# Patient Record
Sex: Female | Born: 1960 | Race: White | Hispanic: No | Smoking: Former smoker
Health system: Southern US, Community
[De-identification: ages and names within clinical notes are randomized; demographics above are authoritative.]

## PROBLEM LIST (undated history)

## (undated) DIAGNOSIS — I10 Essential (primary) hypertension: Secondary | ICD-10-CM

## (undated) DIAGNOSIS — A0472 Enterocolitis due to Clostridium difficile, not specified as recurrent: Secondary | ICD-10-CM

## (undated) DIAGNOSIS — I2510 Atherosclerotic heart disease of native coronary artery without angina pectoris: Secondary | ICD-10-CM

## (undated) DIAGNOSIS — J439 Emphysema, unspecified: Secondary | ICD-10-CM

## (undated) DIAGNOSIS — R42 Dizziness and giddiness: Secondary | ICD-10-CM

## (undated) DIAGNOSIS — E785 Hyperlipidemia, unspecified: Secondary | ICD-10-CM

## (undated) HISTORY — DX: Dizziness and giddiness: R42

## (undated) HISTORY — PX: CHOLECYSTECTOMY: SHX55

## (undated) HISTORY — DX: Emphysema, unspecified: J43.9

## (undated) HISTORY — PX: HERNIA REPAIR: SHX51

## (undated) HISTORY — PX: ABDOMINAL HYSTERECTOMY: SHX81

## (undated) HISTORY — DX: Hyperlipidemia, unspecified: E78.5

## (undated) HISTORY — DX: Essential (primary) hypertension: I10

---

## 1995-05-20 HISTORY — PX: TUBAL LIGATION: SHX77

## 1997-05-19 HISTORY — PX: OTHER SURGICAL HISTORY: SHX169

## 2013-08-31 ENCOUNTER — Institutional Professional Consult (permissible substitution): Payer: Self-pay | Admitting: Emergency Medicine

## 2013-10-04 ENCOUNTER — Ambulatory Visit (INDEPENDENT_AMBULATORY_CARE_PROVIDER_SITE_OTHER): Payer: Self-pay | Admitting: Critical Care Medicine

## 2013-10-04 ENCOUNTER — Encounter: Payer: Self-pay | Admitting: Critical Care Medicine

## 2013-10-04 VITALS — BP 100/58 | HR 59 | Temp 99.4°F | Ht 61.0 in | Wt 147.2 lb

## 2013-10-04 DIAGNOSIS — I110 Hypertensive heart disease with heart failure: Secondary | ICD-10-CM | POA: Insufficient documentation

## 2013-10-04 DIAGNOSIS — J441 Chronic obstructive pulmonary disease with (acute) exacerbation: Secondary | ICD-10-CM | POA: Insufficient documentation

## 2013-10-04 DIAGNOSIS — E785 Hyperlipidemia, unspecified: Secondary | ICD-10-CM | POA: Insufficient documentation

## 2013-10-04 DIAGNOSIS — F17200 Nicotine dependence, unspecified, uncomplicated: Secondary | ICD-10-CM

## 2013-10-04 DIAGNOSIS — J438 Other emphysema: Secondary | ICD-10-CM

## 2013-10-04 DIAGNOSIS — R0600 Dyspnea, unspecified: Secondary | ICD-10-CM

## 2013-10-04 DIAGNOSIS — I10 Essential (primary) hypertension: Secondary | ICD-10-CM

## 2013-10-04 DIAGNOSIS — J439 Emphysema, unspecified: Secondary | ICD-10-CM | POA: Insufficient documentation

## 2013-10-04 MED ORDER — BUDESONIDE-FORMOTEROL FUMARATE 160-4.5 MCG/ACT IN AERO: 2.0000 | INHALATION_SPRAY | Freq: Two times a day (BID) | RESPIRATORY_TRACT | Status: AC

## 2013-10-04 MED ORDER — PREDNISONE 10 MG PO TABS: ORAL_TABLET | ORAL | Status: DC

## 2013-10-04 MED ORDER — AZITHROMYCIN 250 MG PO TABS: ORAL_TABLET | ORAL | Status: DC

## 2013-10-04 NOTE — Assessment & Plan Note (Signed)
Ongoing tobacco use Plan Smoking cessation >10 min counseling issued to the pt

## 2013-10-04 NOTE — Progress Notes (Signed)
Subjective:    Patient ID: Kathy Lawson, female    DOB: 12-Oct-1960, 53 y.o.   MRN: [id number]  HPI Comments: Pt with dyspnea and cough .  Freq bronchitis.  ?COPD> never dx.  Has been to ED and thought had some emphysema. Now smokes 1.5 PPD.    Cough This is a chronic problem. The current episode started more than 1 year ago. The problem has been waxing and waning (chronic cough ). The problem occurs every few hours. The cough is productive of sputum (clear thick mucus). Associated symptoms include heartburn, nasal congestion, postnasal drip, shortness of breath and wheezing. Pertinent negatives include no chest pain, chills, ear congestion, ear pain, fever, headaches, hemoptysis, rash, rhinorrhea, sore throat, sweats or weight loss. Associated symptoms comments: Dyspnea with walking, working outdoors and indoors. Notes some qhs dyspnea. The symptoms are aggravated by exercise, cold air, fumes, dust, pollens, stress and lying down. Risk factors for lung disease include smoking/tobacco exposure. She has tried a beta-agonist inhaler for the symptoms. The treatment provided moderate relief. Her past medical history is significant for bronchitis and pneumonia. There is no history of asthma, bronchiectasis, COPD, emphysema or environmental allergies.   Past Medical History  Diagnosis Date  . Hypertension   . Hyperlipemia   . Emphysema/COPD   . Vertigo      Family History  Problem Relation Age of Onset  . Emphysema Father   . Allergies Sister   . Rheum arthritis Mother   . Skin cancer Mother      History   Social History  . Marital Status: N/A    Spouse Name: N/A    Number of Children: N/A  . Years of Education: N/A   Occupational History  . Not on file.   Social History Main Topics  . Smoking status: Current Every Day Smoker -- 1.50 packs/day    Types: Cigarettes    Start date: 05/19/1974  . Smokeless tobacco: Never Used  . Alcohol Use: Yes     Comment: 5-6 beers couple  times weekly  . Drug Use: No  . Sexual Activity: Not on file   Other Topics Concern  . Not on file   Social History Narrative  . No narrative on file     No Known Allergies   No outpatient prescriptions prior to visit.   No facility-administered medications prior to visit.       Review of Systems  Constitutional: Negative for fever, chills and weight loss.  HENT: Positive for postnasal drip, sinus pressure, sneezing, tinnitus, trouble swallowing and voice change. Negative for ear pain, rhinorrhea and sore throat.   Respiratory: Positive for cough, shortness of breath and wheezing. Negative for hemoptysis.   Cardiovascular: Negative for chest pain.  Gastrointestinal: Positive for heartburn.       Gerd   Skin: Negative for rash.  Allergic/Immunologic: Negative for environmental allergies.  Neurological: Negative for headaches.       Objective:   Physical Exam Filed Vitals:   10/04/13 1102  BP: 100/58  Pulse: 59  Temp: 99.4 F (37.4 C)  TempSrc: Oral  Height: 5' 1"  (1.549 m)  Weight: 147 lb 3.2 oz (66.769 kg)  SpO2: 97%    Gen: Pleasant, well-nourished, in no distress,  normal affect  ENT: No lesions,  mouth clear,  oropharynx clear, no postnasal drip  Neck: No JVD, no TMG, no carotid bruits  Lungs: No use of accessory muscles, no dullness to percussion,expired wheezes  Cardiovascular: RRR, heart sounds normal,  no murmur or gallops, no peripheral edema  Abdomen: soft and NT, no HSM,  BS normal  Musculoskeletal: No deformities, no cyanosis or clubbing  Neuro: alert, non focal  Skin: Warm, no lesions or rashes  No results found.  Arlyce Harman: severe obstruction CXR: copd changes         Assessment & Plan:   Obstructive chronic bronchitis with exacerbation Asthmatic bronchitis with flare Gold C Copd , Ongoing tobacco use  Plan Stop smoking, use nicorette minis lozenges 46m 4-6 per day Prednisone 185mTake 4 tablets daily for 5 days then  stop Start Symbicort two puff twice daily Azithromycin 25057make two once then one daily until gone Use albuterol as needed A blood test to check for gene deficiency for premature emphysema will be obtained Return 2 months   Emphysema/COPD Gold C Primary emphysema Plan Check AA1 AT assay   Tobacco use disorder Ongoing tobacco use Plan Smoking cessation >10 min counseling issued to the pt    Updated Medication List Outpatient Encounter Prescriptions as of 10/04/2013  Medication Sig  . albuterol (PROVENTIL HFA;VENTOLIN HFA) 108 (90 BASE) MCG/ACT inhaler Inhale 2 puffs into the lungs. Every 2-3 hours as needed  . albuterol (PROVENTIL) (2.5 MG/3ML) 0.083% nebulizer solution Take 2.5 mg by nebulization every 6 (six) hours as needed for wheezing or shortness of breath.  . aMarland Kitchenpirin 81 MG tablet Take 81 mg by mouth daily.  . aMarland Kitchenorvastatin (LIPITOR) 10 MG tablet Take 10 mg by mouth daily.  . calcium-vitamin D (OSCAL) 250-125 MG-UNIT per tablet Take 1 tablet by mouth 2 (two) times daily.  . Cholecalciferol (VITAMIN D PO) Take by mouth daily.  . diphenoxylate-atropine (LOMOTIL) 2.5-0.025 MG per tablet Take 2 tablets by mouth 3 (three) times daily as needed for diarrhea or loose stools.  . eMarland Kitchencitalopram (LEXAPRO) 10 MG tablet Take 10 mg by mouth daily.  . fluticasone (FLONASE) 50 MCG/ACT nasal spray Place 2 sprays into both nostrils daily as needed for allergies or rhinitis.  . lMarland Kitchensinopril-hydrochlorothiazide (PRINZIDE,ZESTORETIC) 20-12.5 MG per tablet Take 1 tablet by mouth daily.  . metoprolol succinate (TOPROL-XL) 25 MG 24 hr tablet Take 25 mg by mouth daily.  . montelukast (SINGULAIR) 10 MG tablet Take 10 mg by mouth at bedtime.  . oMarland Kitcheneprazole (PRILOSEC) 40 MG capsule Take 40 mg by mouth daily.  . [DISCONTINUED] lisinopril (PRINIVIL,ZESTRIL) 20 MG tablet Take 20 mg by mouth daily.  . aMarland Kitchenithromycin (ZITHROMAX) 250 MG tablet Take two once then one daily until gone  . budesonide-formoterol  (SYMBICORT) 160-4.5 MCG/ACT inhaler Inhale 2 puffs into the lungs 2 (two) times daily.  . predniSONE (DELTASONE) 10 MG tablet Take 4 tablets daily for 5 days then stop

## 2013-10-04 NOTE — Assessment & Plan Note (Signed)
Asthmatic bronchitis with flare Gold C Copd , Ongoing tobacco use  Plan Stop smoking, use nicorette minis lozenges 35m 4-6 per day Prednisone 141mTake 4 tablets daily for 5 days then stop Start Symbicort two puff twice daily Azithromycin 25038make two once then one daily until gone Use albuterol as needed A blood test to check for gene deficiency for premature emphysema will be obtained Return 2 months

## 2013-10-04 NOTE — Assessment & Plan Note (Addendum)
Primary emphysema Plan Check AA1 AT assay

## 2013-10-04 NOTE — Patient Instructions (Addendum)
Stop smoking, use nicorette minis lozenges 2m 4-6 per day Prednisone 130mTake 4 tablets daily for 5 days then stop Start Symbicort two puff twice daily Azithromycin 25029make two once then one daily until gone Use albuterol as needed A blood test to check for gene deficiency for premature emphysema will be obtained Return 2 months

## 2013-10-11 ENCOUNTER — Encounter: Payer: Self-pay | Admitting: Critical Care Medicine

## 2013-10-11 ENCOUNTER — Telehealth: Payer: Self-pay | Admitting: Critical Care Medicine

## 2013-10-11 NOTE — Telephone Encounter (Signed)
Call the pt and tell her Alpha one antitrypsin level was NORMAL

## 2013-10-11 NOTE — Telephone Encounter (Signed)
Called, spoke with pt.  Informed her of Alpha one antitrypsin results per Dr. Joya Gaskins.  She verbalized understanding and voiced no further questions or concerns at this time.

## 2013-11-29 ENCOUNTER — Ambulatory Visit (INDEPENDENT_AMBULATORY_CARE_PROVIDER_SITE_OTHER): Payer: Self-pay | Admitting: Critical Care Medicine

## 2013-11-29 ENCOUNTER — Encounter: Payer: Self-pay | Admitting: Critical Care Medicine

## 2013-11-29 VITALS — BP 122/68 | HR 65 | Temp 98.0°F | Ht 62.0 in | Wt 148.0 lb

## 2013-11-29 DIAGNOSIS — F17200 Nicotine dependence, unspecified, uncomplicated: Secondary | ICD-10-CM

## 2013-11-29 DIAGNOSIS — J438 Other emphysema: Secondary | ICD-10-CM

## 2013-11-29 DIAGNOSIS — J432 Centrilobular emphysema: Secondary | ICD-10-CM

## 2013-11-29 DIAGNOSIS — J441 Chronic obstructive pulmonary disease with (acute) exacerbation: Secondary | ICD-10-CM

## 2013-11-29 NOTE — Progress Notes (Signed)
Subjective:    Patient ID: Kathy Lawson, female    DOB: 16-Dec-1960, 53 y.o.   MRN: [id number]  HPI Comments: Pt with dyspnea and cough .  Freq bronchitis.  ?COPD> never dx.  Has been to ED and thought had some emphysema. Now smokes 1.5 PPD.    11/29/2013 Chief Complaint  Patient presents with  . 2 month follow up    DOE and cough have improved with symbicort.  Cough is prod mainly int the mornings with small amount of clear mucus.  No chest tightness/pain or wheezing.   Now on symbicort and doing well. Less dyspnea. Still smoking 1.5 PPD.   Pt went on another round on prednisone since last ov.  Has not gotten nicotine replacement.       Review of Systems  HENT: Positive for sinus pressure, sneezing, tinnitus, trouble swallowing and voice change.   Gastrointestinal:       Gerd        Objective:   Physical Exam  Filed Vitals:   11/29/13 1415  BP: 122/68  Pulse: 65  Temp: 98 F (36.7 C)  TempSrc: Oral  Height: 5' 2"  (1.575 m)  Weight: 148 lb (67.132 kg)  SpO2: 99%    Gen: Pleasant, well-nourished, in no distress,  normal affect  ENT: No lesions,  mouth clear,  oropharynx clear, no postnasal drip  Neck: No JVD, no TMG, no carotid bruits  Lungs: No use of accessory muscles, no dullness to percussion,decreased  wheezes  Cardiovascular: RRR, heart sounds normal, no murmur or gallops, no peripheral edema  Abdomen: soft and NT, no HSM,  BS normal  Musculoskeletal: No deformities, no cyanosis or clubbing  Neuro: alert, non focal  Skin: Warm, no lesions or rashes  No results found.     Assessment & Plan:   Emphysema/COPD Gold C Copd gold C  Ongoing tobacco use  Plan Stay on symbicort twice daily Focus on smoking cessation with nicorette minis 17m 6-8 per day Return 3 months    Updated Medication List Outpatient Encounter Prescriptions as of 11/29/2013  Medication Sig  . albuterol (PROVENTIL HFA;VENTOLIN HFA) 108 (90 BASE) MCG/ACT inhaler Inhale  2 puffs into the lungs. Every 2-3 hours as needed  . albuterol (PROVENTIL) (2.5 MG/3ML) 0.083% nebulizer solution Take 2.5 mg by nebulization every 6 (six) hours as needed for wheezing or shortness of breath.  .Marland Kitchenaspirin 81 MG tablet Take 81 mg by mouth daily.  .Marland Kitchenatorvastatin (LIPITOR) 10 MG tablet Take 10 mg by mouth daily.  . budesonide-formoterol (SYMBICORT) 160-4.5 MCG/ACT inhaler Inhale 2 puffs into the lungs 2 (two) times daily.  . calcium-vitamin D (OSCAL) 250-125 MG-UNIT per tablet Take 1 tablet by mouth 2 (two) times daily.  . Cholecalciferol (VITAMIN D PO) Take by mouth daily.  . diphenoxylate-atropine (LOMOTIL) 2.5-0.025 MG per tablet Take 2 tablets by mouth 3 (three) times daily as needed for diarrhea or loose stools.  .Marland Kitchenescitalopram (LEXAPRO) 10 MG tablet Take 10 mg by mouth daily.  . fluticasone (FLONASE) 50 MCG/ACT nasal spray Place 2 sprays into both nostrils daily as needed for allergies or rhinitis.  .Marland Kitchenlisinopril-hydrochlorothiazide (PRINZIDE,ZESTORETIC) 20-12.5 MG per tablet Take 1 tablet by mouth daily.  . metoprolol succinate (TOPROL-XL) 25 MG 24 hr tablet Take 25 mg by mouth daily.  . montelukast (SINGULAIR) 10 MG tablet Take 10 mg by mouth at bedtime.  .Marland Kitchenomeprazole (PRILOSEC) 40 MG capsule Take 40 mg by mouth daily.  . [DISCONTINUED] azithromycin (ZITHROMAX) 250 MG  tablet Take two once then one daily until gone  . [DISCONTINUED] predniSONE (DELTASONE) 10 MG tablet Take 4 tablets daily for 5 days then stop

## 2013-11-29 NOTE — Patient Instructions (Signed)
Stay on symbicort twice daily Focus on smoking cessation with nicorette minis 67m 6-8 per day Return 3 months

## 2013-12-01 NOTE — Assessment & Plan Note (Signed)
Copd gold C  Ongoing tobacco use  Plan Stay on symbicort twice daily Focus on smoking cessation with nicorette minis 42m 6-8 per day Return 3 months

## 2013-12-20 ENCOUNTER — Encounter: Payer: Self-pay | Admitting: Critical Care Medicine

## 2014-04-18 ENCOUNTER — Ambulatory Visit: Payer: Medicaid Other | Admitting: Critical Care Medicine

## 2015-02-07 DIAGNOSIS — Z8709 Personal history of other diseases of the respiratory system: Secondary | ICD-10-CM

## 2015-02-07 DIAGNOSIS — J309 Allergic rhinitis, unspecified: Principal | ICD-10-CM

## 2015-02-07 DIAGNOSIS — Z789 Other specified health status: Secondary | ICD-10-CM

## 2015-02-07 DIAGNOSIS — K219 Gastro-esophageal reflux disease without esophagitis: Secondary | ICD-10-CM | POA: Insufficient documentation

## 2015-02-07 DIAGNOSIS — Z720 Tobacco use: Secondary | ICD-10-CM | POA: Insufficient documentation

## 2015-02-07 DIAGNOSIS — R519 Headache, unspecified: Secondary | ICD-10-CM | POA: Insufficient documentation

## 2015-02-07 DIAGNOSIS — R51 Headache: Secondary | ICD-10-CM

## 2015-02-07 DIAGNOSIS — J329 Chronic sinusitis, unspecified: Secondary | ICD-10-CM | POA: Insufficient documentation

## 2015-02-07 DIAGNOSIS — H1010 Acute atopic conjunctivitis, unspecified eye: Secondary | ICD-10-CM | POA: Insufficient documentation

## 2015-11-18 DIAGNOSIS — I10 Essential (primary) hypertension: Secondary | ICD-10-CM | POA: Diagnosis not present

## 2015-11-18 DIAGNOSIS — E871 Hypo-osmolality and hyponatremia: Secondary | ICD-10-CM

## 2015-11-18 DIAGNOSIS — Z720 Tobacco use: Secondary | ICD-10-CM

## 2015-11-18 DIAGNOSIS — J449 Chronic obstructive pulmonary disease, unspecified: Secondary | ICD-10-CM | POA: Diagnosis not present

## 2015-11-18 DIAGNOSIS — E876 Hypokalemia: Secondary | ICD-10-CM | POA: Diagnosis not present

## 2015-11-18 DIAGNOSIS — N179 Acute kidney failure, unspecified: Secondary | ICD-10-CM | POA: Diagnosis not present

## 2015-11-18 DIAGNOSIS — S81802A Unspecified open wound, left lower leg, initial encounter: Secondary | ICD-10-CM

## 2015-11-18 DIAGNOSIS — N390 Urinary tract infection, site not specified: Secondary | ICD-10-CM

## 2015-11-18 DIAGNOSIS — G894 Chronic pain syndrome: Secondary | ICD-10-CM

## 2015-11-19 DIAGNOSIS — J449 Chronic obstructive pulmonary disease, unspecified: Secondary | ICD-10-CM | POA: Diagnosis not present

## 2015-11-19 DIAGNOSIS — N179 Acute kidney failure, unspecified: Secondary | ICD-10-CM | POA: Diagnosis not present

## 2015-11-19 DIAGNOSIS — E876 Hypokalemia: Secondary | ICD-10-CM | POA: Diagnosis not present

## 2015-11-19 DIAGNOSIS — I10 Essential (primary) hypertension: Secondary | ICD-10-CM | POA: Diagnosis not present

## 2015-11-20 DIAGNOSIS — I10 Essential (primary) hypertension: Secondary | ICD-10-CM | POA: Diagnosis not present

## 2015-11-20 DIAGNOSIS — J449 Chronic obstructive pulmonary disease, unspecified: Secondary | ICD-10-CM | POA: Diagnosis not present

## 2015-11-20 DIAGNOSIS — N179 Acute kidney failure, unspecified: Secondary | ICD-10-CM | POA: Diagnosis not present

## 2015-11-20 DIAGNOSIS — E876 Hypokalemia: Secondary | ICD-10-CM | POA: Diagnosis not present

## 2015-11-21 DIAGNOSIS — E871 Hypo-osmolality and hyponatremia: Secondary | ICD-10-CM

## 2015-11-21 DIAGNOSIS — E876 Hypokalemia: Secondary | ICD-10-CM | POA: Diagnosis not present

## 2015-11-21 DIAGNOSIS — J449 Chronic obstructive pulmonary disease, unspecified: Secondary | ICD-10-CM

## 2015-11-22 DIAGNOSIS — E876 Hypokalemia: Secondary | ICD-10-CM | POA: Diagnosis not present

## 2015-11-22 DIAGNOSIS — E871 Hypo-osmolality and hyponatremia: Secondary | ICD-10-CM | POA: Diagnosis not present

## 2015-11-22 DIAGNOSIS — J449 Chronic obstructive pulmonary disease, unspecified: Secondary | ICD-10-CM | POA: Diagnosis not present

## 2015-12-24 DIAGNOSIS — L97922 Non-pressure chronic ulcer of unspecified part of left lower leg with fat layer exposed: Secondary | ICD-10-CM | POA: Insufficient documentation

## 2018-07-01 DIAGNOSIS — I361 Nonrheumatic tricuspid (valve) insufficiency: Secondary | ICD-10-CM

## 2018-07-01 DIAGNOSIS — E876 Hypokalemia: Secondary | ICD-10-CM

## 2018-07-01 DIAGNOSIS — E871 Hypo-osmolality and hyponatremia: Secondary | ICD-10-CM

## 2018-07-01 DIAGNOSIS — J9621 Acute and chronic respiratory failure with hypoxia: Secondary | ICD-10-CM

## 2018-07-01 DIAGNOSIS — A419 Sepsis, unspecified organism: Secondary | ICD-10-CM

## 2018-07-01 DIAGNOSIS — J440 Chronic obstructive pulmonary disease with acute lower respiratory infection: Secondary | ICD-10-CM

## 2018-07-01 DIAGNOSIS — J189 Pneumonia, unspecified organism: Secondary | ICD-10-CM

## 2018-07-01 DIAGNOSIS — I472 Ventricular tachycardia: Secondary | ICD-10-CM

## 2018-07-01 DIAGNOSIS — I10 Essential (primary) hypertension: Secondary | ICD-10-CM

## 2018-07-02 DIAGNOSIS — R7989 Other specified abnormal findings of blood chemistry: Secondary | ICD-10-CM

## 2018-07-02 DIAGNOSIS — I429 Cardiomyopathy, unspecified: Secondary | ICD-10-CM

## 2018-07-03 DIAGNOSIS — I10 Essential (primary) hypertension: Secondary | ICD-10-CM

## 2018-07-03 DIAGNOSIS — I429 Cardiomyopathy, unspecified: Secondary | ICD-10-CM

## 2018-07-03 DIAGNOSIS — R7989 Other specified abnormal findings of blood chemistry: Secondary | ICD-10-CM

## 2018-07-10 DIAGNOSIS — J449 Chronic obstructive pulmonary disease, unspecified: Secondary | ICD-10-CM

## 2018-07-10 DIAGNOSIS — R7989 Other specified abnormal findings of blood chemistry: Secondary | ICD-10-CM

## 2018-07-10 DIAGNOSIS — Z720 Tobacco use: Secondary | ICD-10-CM

## 2018-07-28 NOTE — Progress Notes (Signed)
Cardiology Office Note:    Date:  07/29/2018   ID:  Kathy Lawson, DOB 1961-03-08, MRN [id number]  PCP:  Damita Dunnings, NP (Inactive)  Cardiologist:  Shirlee More, MD   Referring MD: No ref. provider found  ASSESSMENT:    1. CAD in native artery   2. Chronic combined systolic and diastolic CHF (congestive heart failure) ([place])   3. Shortness of breath   4. Leg swelling   5. Essential hypertension   6. Tobacco abuse   7. Obstructive chronic bronchitis with exacerbation (H[facility])    PLAN:    In order of problems listed above:  More than 45 minutes and greater than 50% was spent in education the patient and family in her medical conditions and cardiac problems as she was acutely ill in the hospital and several family members were not aware in care coordination especially with her decompensated COPD and heart failure today and the necessity for lab work in 1 week.  Is a very complicated visit involving 3 family members present part  1. Is a very chronically ill woman with a recent type II myocardial infarction in the setting of severe respiratory failure and pneumonia deterioration and COPD.  She is not doing well she has recurrent pneumonia and I think she is a very poor candidate for elective cardiac interventional procedures will maximize medical therapy and reassess in the office in 6 weeks and told the family at this time I would not refer for coronary angiography they agree 2. Heart failure is decompensated she is edematous I started on loop diuretic and asked her PCP to check renal function proBNP in 1 week's she tells me she had hyponatremia with furosemide in the past 3. Stable hypertension continue current treatment 4. Predominant problem is severe COPD with another acute exacerbation and pneumonia 5. Hyperlipidemia stable continue statin  Next appointment  6 weeks   Medication Adjustments/Labs and Tests Ordered: Current medicines are reviewed at length with the patient  today.  Concerns regarding medicines are outlined above.  Orders Placed This Encounter  Procedures  . Basic Metabolic Panel (BMET)  . Pro b natriuretic peptide (BNP)  . EKG 12-Lead   Meds ordered this encounter  Medications  . DISCONTD: torsemide (DEMADEX) 20 MG tablet    Sig: Take 1 tablet (20 mg total) by mouth daily.    Dispense:  30 tablet    Refill:  1  . diltiazem (CARDIZEM CD) 180 MG 24 hr capsule    Sig: Take 1 capsule (180 mg total) by mouth daily.    Dispense:  30 capsule    Refill:  1     Chief Complaint  Patient presents with  . Follow-up    after Type 2 MI    History of Present Illness:    Kathy Lawson is a 58 y.o. female who is being seen today for the evaluation of CAD with recent non-ST elevation type II myocardial infarction in the setting of severe COPD profound respiratory distress and decompensated heart failure with IV fluid loading while admitted to [facility].  Her ejection fraction was in the range of 35 to 40% was discharged and sent to rehab.  Surprisingly she is not taking a diuretic and she is noticed that she has edema she initially improved and was walking independently 50 foot but she is back in a wheelchair now tells me she has recurrent pneumonia is short of breath at rest using bronchodilators and is coughing up clear sputum.  On  physical examination she has marked edema presacral 2-3+1+ lower extremities and has decompensated heart failure.  There is a note from the hospital that she is to see me to arrange coronary angiography and I think she is in poor condition for elective cardiac interventional procedures want him undergo start antianginal therapy with Cardizem with her severe COPD continue aspirin and statin and started back on loop diuretic she tells me she is intolerant of furosemide put her on Demadex 20 mg daily and asked the healthcare facility to do a BMP proBNP in 1 week.  The family agrees with the approach I will see her back in  the office in 4 to 6 weeks and will make a decision whether she is best served by noninvasive perfusion imaging or cardiac CTA or refer for coronary angiography.  She is not having orthopnea chest pain palpitation or syncope at the request of Dr Jannette Fogo  Past Medical History:  Diagnosis Date  . Emphysema/COPD ([place])   . Hyperlipemia   . Hypertension   . Vertigo     Past Surgical History:  Procedure Laterality Date  . CHOLECYSTECTOMY  1980s  . hysterectomy  1999  . TUBAL LIGATION  1997    Current Medications: Current Meds  Medication Sig  . albuterol (PROVENTIL HFA;VENTOLIN HFA) 108 (90 BASE) MCG/ACT inhaler Inhale 2 puffs into the lungs. Every 2-3 hours as needed  . albuterol (PROVENTIL) (2.5 MG/3ML) 0.083% nebulizer solution Take 2.5 mg by nebulization every 6 (six) hours as needed for wheezing or shortness of breath.  Marland Kitchen aspirin 81 MG tablet Take 81 mg by mouth daily.  . budesonide-formoterol (SYMBICORT) 160-4.5 MCG/ACT inhaler Inhale 2 puffs into the lungs 2 (two) times daily.  . Calcium Carbonate-Vit D-Min (CALCIUM 1200 PO) Take by mouth daily.  . calcium-vitamin D (OSCAL) 250-125 MG-UNIT per tablet Take 1 tablet by mouth 2 (two) times daily.  . cetirizine (ZYRTEC) 10 MG tablet Take 10 mg by mouth daily.  . Cholecalciferol (VITAMIN D PO) Take by mouth daily.  . diphenoxylate-atropine (LOMOTIL) 2.5-0.025 MG per tablet Take 2 tablets by mouth 3 (three) times daily as needed for diarrhea or loose stools.  . fluticasone (FLONASE) 50 MCG/ACT nasal spray Place 2 sprays into both nostrils daily as needed for allergies or rhinitis.     Allergies:   Lasix [furosemide]; Meloxicam; Other; and Latex   Social History   Socioeconomic History  . Marital status: Widowed    Spouse name: Not on file  . Number of children: Not on file  . Years of education: Not on file  . Highest education level: Not on file  Occupational History  . Not on file  Social Needs  . Financial resource strain:  Not on file  . Food insecurity:    Worry: Not on file    Inability: Not on file  . Transportation needs:    Medical: Not on file    Non-medical: Not on file  Tobacco Use  . Smoking status: Current Every Day Smoker    Packs/day: 1.50    Types: Cigarettes    Start date: 05/19/1974  . Smokeless tobacco: Never Used  Substance and Sexual Activity  . Alcohol use: Yes    Comment: 5-6 beers couple times weekly  . Drug use: No  . Sexual activity: Not on file  Lifestyle  . Physical activity:    Days per week: Not on file    Minutes per session: Not on file  . Stress: Not on file  Relationships  . Social connections:    Talks on phone: Not on file    Gets together: Not on file    Attends religious service: Not on file    Active member of club or organization: Not on file    Attends meetings of clubs or organizations: Not on file    Relationship status: Not on file  Other Topics Concern  . Not on file  Social History Narrative  . Not on file     Family History: The patient's family history includes Allergies in her sister; Emphysema in her father; Rheum arthritis in her mother; Skin cancer in her mother.  ROS:   ROS Please see the history of present illness.     All other systems reviewed and are negative.  EKGs/Labs/Other Studies Reviewed:    The following studies were reviewed today:   EKG:  EKG is  ordered today.  The ekg ordered today is personally reviewed and demonstrates marked sinus tachycardia and T wave inversions  Recent Labs: No results found for requested labs within last 8760 hours.  Recent Lipid Panel No results found for: CHOL, TRIG, HDL, CHOLHDL, VLDL, LDLCALC, LDLDIRECT  Physical Exam:    VS:  BP 118/62   Pulse (!) 113   Ht 5' 2"  (1.575 m)   Wt 173 lb (78.5 kg)   SpO2 98%   BMI 31.64 kg/m     Wt Readings from Last 3 Encounters:  07/29/18 173 lb (78.5 kg)  11/29/13 148 lb (67.1 kg)  10/04/13 147 lb 3.2 oz (66.8 kg)     GEN: She looks quite  debilitated and weak well nourished, well developed in no acute distress HEENT: Normal NECK: No JVD; No carotid bruits LYMPHATICS: No lymphadenopathy CARDIAC: Distant heart sounds RRR, no murmurs, rubs, gallops RESPIRATORY: Hyperinflated diffusely decreased breath sounds expiratory wheeze ABDOMEN: Soft, non-tender, non-distended MUSCULOSKELETAL: 3-4+ presacral edema edema; No deformity  SKIN: Warm and dry NEUROLOGIC:  Alert and oriented x 3 PSYCHIATRIC:  Normal affect     Signed, Shirlee More, MD  07/29/2018 1:05 PM    [facility]

## 2018-07-29 ENCOUNTER — Encounter: Payer: Self-pay | Admitting: Cardiology

## 2018-07-29 ENCOUNTER — Ambulatory Visit (INDEPENDENT_AMBULATORY_CARE_PROVIDER_SITE_OTHER): Payer: Medicaid Other | Admitting: Cardiology

## 2018-07-29 ENCOUNTER — Other Ambulatory Visit: Payer: Self-pay

## 2018-07-29 VITALS — BP 118/62 | HR 113 | Ht 62.0 in | Wt 173.0 lb

## 2018-07-29 DIAGNOSIS — R0602 Shortness of breath: Secondary | ICD-10-CM

## 2018-07-29 DIAGNOSIS — M7989 Other specified soft tissue disorders: Secondary | ICD-10-CM

## 2018-07-29 DIAGNOSIS — I2510 Atherosclerotic heart disease of native coronary artery without angina pectoris: Secondary | ICD-10-CM | POA: Insufficient documentation

## 2018-07-29 DIAGNOSIS — I5042 Chronic combined systolic (congestive) and diastolic (congestive) heart failure: Secondary | ICD-10-CM

## 2018-07-29 DIAGNOSIS — Z720 Tobacco use: Secondary | ICD-10-CM

## 2018-07-29 DIAGNOSIS — I10 Essential (primary) hypertension: Secondary | ICD-10-CM

## 2018-07-29 DIAGNOSIS — J441 Chronic obstructive pulmonary disease with (acute) exacerbation: Secondary | ICD-10-CM

## 2018-07-29 MED ORDER — TORSEMIDE 20 MG PO TABS: 20.0000 mg | ORAL_TABLET | Freq: Every day | ORAL | 1 refills | Status: DC

## 2018-07-29 MED ORDER — DILTIAZEM HCL ER COATED BEADS 180 MG PO CP24: 180.0000 mg | ORAL_CAPSULE | Freq: Every day | ORAL | 1 refills | Status: DC

## 2018-07-29 NOTE — Patient Instructions (Signed)
Medication Instructions:  Your physician has recommended you make the following change in your medication:  START torsemide (demadex) 20 mg: Take 1 tablet daily START diltiazem (cardizem CD) 180 mg: Take 1 tablet daily  If you need a refill on your cardiac medications before your next appointment, please call your pharmacy.   Lab work: Your physician recommends that you return for lab work in 1 week: BMP, ProBNP. Please return to our office for lab work, no appointment needed. No need to fast beforehand.   If you have labs (blood work) drawn today and your tests are completely normal, you will receive your results only by: Marland Kitchen MyChart Message (if you have MyChart) OR . A paper copy in the mail If you have any lab test that is abnormal or we need to change your treatment, we will call you to review the results.  Testing/Procedures: You had an EKG today.   Follow-Up: At [facility], you and your health needs are our priority.  As part of our continuing mission to provide you with exceptional heart care, we have created designated Provider Care Teams.  These Care Teams include your primary Cardiologist (physician) and Advanced Practice Providers (APPs -  Physician Assistants and Nurse Practitioners) who all work together to provide you with the care you need, when you need it. You will need a follow up appointment in 6 weeks.      Torsemide tablets What is this medicine? TORSEMIDE (TORE se mide) is a diuretic. It helps you make more urine and to lose salt and excess water from your body. This medicine is used to treat high blood pressure, and edema or swelling from heart, kidney, or liver disease. This medicine may be used for other purposes; ask your health care provider or pharmacist if you have questions. COMMON BRAND NAME(S): Demadex What should I tell my health care provider before I take this medicine? They need to know if you have any of these conditions: -abnormal blood  electrolytes -diabetes -gout -heart disease -kidney disease -liver disease -small amounts of urine, or difficulty passing urine -an unusual or allergic reaction to torsemide, sulfa drugs, other medicines, foods, dyes, or preservatives -pregnant or trying to get pregnant -breast-feeding How should I use this medicine? Take this medicine by mouth with a glass of water. Follow the directions on the prescription label. You may take this medicine with or without food. If it upsets your stomach, take it with food or milk. Do not take your medicine more often than directed. Remember that you will need to pass more urine after taking this medicine. Do not take your medicine at a time of day that will cause you problems. Do not take at bedtime. Talk to your pediatrician regarding the use of this medicine in children. Special care may be needed. Overdosage: If you think you have taken too much of this medicine contact a poison control center or emergency room at once. NOTE: This medicine is only for you. Do not share this medicine with others. What if I miss a dose? If you miss a dose, take it as soon as you can. If it is almost time for your next dose, take only that dose. Do not take double or extra doses. What may interact with this medicine? -alcohol -certain antibiotics given by injection certain heart medicines like digoxin -diuretics -lithium -medicines for diabetes -medicines for blood pressure -medicines for cholesterol like cholestyramine -medicines that relax muscles for surgery -NSAIDs, medicines for pain and inflammation, like  ibuprofen or naproxen -OTC supplements like ginseng and ephedra -probenecid -steroid medicines like prednisone or cortisone This list may not describe all possible interactions. Give your health care provider a list of all the medicines, herbs, non-prescription drugs, or dietary supplements you use. Also tell them if you smoke, drink alcohol, or use illegal  drugs. Some items may interact with your medicine. What should I watch for while using this medicine? Visit your doctor or health care professional for regular checks on your progress. Check your blood pressure regularly. Ask your doctor or health care professional what your blood pressure should be, and when you should contact him or her. If you are a diabetic, check your blood sugar as directed. You may need to be on a special diet while taking this medicine. Check with your doctor. Also, ask how many glasses of fluid you need to drink a day. You must not get dehydrated. You may get drowsy or dizzy. Do not drive, use machinery, or do anything that needs mental alertness until you know how this drug affects you. Do not stand or sit up quickly, especially if you are an older patient. This reduces the risk of dizzy or fainting spells. Alcohol can make you more drowsy and dizzy. Avoid alcoholic drinks. What side effects may I notice from receiving this medicine? Side effects that you should report to your doctor or health care professional as soon as possible: -allergic reactions such as skin rash or itching, hives, swelling of the lips, mouth, tongue or throat -blood in urine or stool -dry mouth -hearing loss or ringing in the ears -irregular heartbeat -muscle pain, weakness or cramps -pain or difficulty passing urine -unusually weak or tired -vomiting or diarrhea Side effects that usually do not require medical attention (report to your doctor or health care professional if they continue or are bothersome): -dizzy or lightheaded -headache -increased thirst -passing large amounts of urine -sexual difficulties -stomach pain, upset or nausea This list may not describe all possible side effects. Call your doctor for medical advice about side effects. You may report side effects to FDA at 1-800-FDA-1088. Where should I keep my medicine? Keep out of the reach of children. Store at room  temperature between 15 and 30 degrees C (59 and 86 degrees F). Throw away any unused medicine after the expiration date. NOTE: This sheet is a summary. It may not cover all possible information. If you have questions about this medicine, talk to your doctor, pharmacist, or health care provider.  2019 Elsevier/Gold Standard (2008-01-20 11:35:45)     Diltiazem extended-release capsules or tablets What is this medicine? DILTIAZEM (dil TYE a zem) is a calcium-channel blocker. It affects the amount of calcium found in your heart and muscle cells. This relaxes your blood vessels, which can reduce the amount of work the heart has to do. This medicine is used to treat high blood pressure and chest pain caused by angina. This medicine may be used for other purposes; ask your health care provider or pharmacist if you have questions. COMMON BRAND NAME(S): Cardizem CD, Cardizem LA, Cardizem SR, Cartia XT, Dilacor XR, Dilt-CD, Diltia XT, Diltzac, Matzim LA, Rema Fendt, Tiamate, Tiazac What should I tell my health care provider before I take this medicine? They need to know if you have any of these conditions: -heart problems, low blood pressure, irregular heartbeat -liver disease -previous heart attack -an unusual or allergic reaction to diltiazem, other medicines, foods, dyes, or preservatives -pregnant or trying to get pregnant -breast-feeding  How should I use this medicine? Take this medicine by mouth with a glass of water. Follow the directions on the prescription label. Swallow whole, do not crush or chew. Ask your doctor or pharmacist if your should take this medicine with food. Take your doses at regular intervals. Do not take your medicine more often then directed. Do not stop taking except on the advice of your doctor or health care professional. Ask your doctor or health care professional how to gradually reduce the dose. Talk to your pediatrician regarding the use of this medicine in children.  Special care may be needed. Overdosage: If you think you have taken too much of this medicine contact a poison control center or emergency room at once. NOTE: This medicine is only for you. Do not share this medicine with others. What if I miss a dose? If you miss a dose, take it as soon as you can. If it is almost time for your next dose, take only that dose. Do not take double or extra doses. What may interact with this medicine? Do not take this medicine with any of the following medications: -cisapride -hawthorn -pimozide -ranolazine -red yeast rice This medicine may also interact with the following medications: -buspirone -carbamazepine -cimetidine -cyclosporine -digoxin -local anesthetics or general anesthetics -lovastatin -medicines for anxiety or difficulty sleeping like midazolam and triazolam -medicines for high blood pressure or heart problems -quinidine -rifampin, rifabutin, or rifapentine This list may not describe all possible interactions. Give your health care provider a list of all the medicines, herbs, non-prescription drugs, or dietary supplements you use. Also tell them if you smoke, drink alcohol, or use illegal drugs. Some items may interact with your medicine. What should I watch for while using this medicine? Check your blood pressure and pulse rate regularly. Ask your doctor or health care professional what your blood pressure and pulse rate should be and when you should contact him or her. You may feel dizzy or lightheaded. Do not drive, use machinery, or do anything that needs mental alertness until you know how this medicine affects you. To reduce the risk of dizzy or fainting spells, do not sit or stand up quickly, especially if you are an older patient. Alcohol can make you more dizzy or increase flushing and rapid heartbeats. Avoid alcoholic drinks. What side effects may I notice from receiving this medicine? Side effects that you should report to your  doctor or health care professional as soon as possible: -allergic reactions like skin rash, itching or hives, swelling of the face, lips, or tongue -confusion, mental depression -feeling faint or lightheaded, falls -redness, blistering, peeling or loosening of the skin, including inside the mouth -slow, irregular heartbeat -swelling of the feet and ankles -unusual bleeding or bruising, pinpoint red spots on the skin Side effects that usually do not require medical attention (report to your doctor or health care professional if they continue or are bothersome): -constipation or diarrhea -difficulty sleeping -facial flushing -headache -nausea, vomiting -sexual dysfunction -weak or tired This list may not describe all possible side effects. Call your doctor for medical advice about side effects. You may report side effects to FDA at 1-800-FDA-1088. Where should I keep my medicine? Keep out of the reach of children. Store at room temperature between 15 and 30 degrees C (59 and 86 degrees F). Protect from humidity. Throw away any unused medicine after the expiration date. NOTE: This sheet is a summary. It may not cover all possible information. If you have questions  about this medicine, talk to your doctor, pharmacist, or health care provider.  2019 Elsevier/Gold Standard (2007-08-26 14:35:47)

## 2018-08-06 ENCOUNTER — Telehealth: Payer: Self-pay

## 2018-08-06 NOTE — Telephone Encounter (Signed)
Received call from Koleen Distance,  NP at [facility] regarding Kathy Lawson. Patient had been complaining of vertigo after recently starting on Torsemide 20 mg daily and Diltiazem 180 mg daily. Potassium checked at facility, result  2.7. Potassium replacement being administered today.  Patient wants to quit taking medications due to vertigo.   Above data reported to Dr. Bettina Gavia. Orders received to discontinue Torsemide and Diltiazem.   Whitney Clendenin informed that Torsemide and Diltiazem have been discontinued per [facility]. Verbalizes understanding of above.

## 2018-08-18 DIAGNOSIS — A0472 Enterocolitis due to Clostridium difficile, not specified as recurrent: Secondary | ICD-10-CM

## 2018-08-18 HISTORY — DX: Enterocolitis due to Clostridium difficile, not specified as recurrent: A04.72

## 2018-08-24 DIAGNOSIS — I429 Cardiomyopathy, unspecified: Secondary | ICD-10-CM

## 2018-08-24 DIAGNOSIS — E119 Type 2 diabetes mellitus without complications: Secondary | ICD-10-CM

## 2018-08-24 DIAGNOSIS — I959 Hypotension, unspecified: Secondary | ICD-10-CM | POA: Diagnosis not present

## 2018-08-24 DIAGNOSIS — E871 Hypo-osmolality and hyponatremia: Secondary | ICD-10-CM

## 2018-08-24 DIAGNOSIS — R531 Weakness: Secondary | ICD-10-CM

## 2018-08-24 DIAGNOSIS — J449 Chronic obstructive pulmonary disease, unspecified: Secondary | ICD-10-CM

## 2018-08-24 DIAGNOSIS — I5020 Unspecified systolic (congestive) heart failure: Secondary | ICD-10-CM

## 2018-08-24 DIAGNOSIS — R197 Diarrhea, unspecified: Secondary | ICD-10-CM

## 2018-08-25 DIAGNOSIS — I959 Hypotension, unspecified: Secondary | ICD-10-CM | POA: Diagnosis not present

## 2018-08-25 DIAGNOSIS — E871 Hypo-osmolality and hyponatremia: Secondary | ICD-10-CM | POA: Diagnosis not present

## 2018-08-25 DIAGNOSIS — R531 Weakness: Secondary | ICD-10-CM | POA: Diagnosis not present

## 2018-08-25 DIAGNOSIS — R197 Diarrhea, unspecified: Secondary | ICD-10-CM | POA: Diagnosis not present

## 2018-08-26 DIAGNOSIS — R531 Weakness: Secondary | ICD-10-CM | POA: Diagnosis not present

## 2018-08-26 DIAGNOSIS — E871 Hypo-osmolality and hyponatremia: Secondary | ICD-10-CM | POA: Diagnosis not present

## 2018-08-26 DIAGNOSIS — R197 Diarrhea, unspecified: Secondary | ICD-10-CM | POA: Diagnosis not present

## 2018-08-26 DIAGNOSIS — I959 Hypotension, unspecified: Secondary | ICD-10-CM | POA: Diagnosis not present

## 2018-08-27 DIAGNOSIS — R531 Weakness: Secondary | ICD-10-CM | POA: Diagnosis not present

## 2018-08-27 DIAGNOSIS — I959 Hypotension, unspecified: Secondary | ICD-10-CM | POA: Diagnosis not present

## 2018-08-27 DIAGNOSIS — E871 Hypo-osmolality and hyponatremia: Secondary | ICD-10-CM | POA: Diagnosis not present

## 2018-08-27 DIAGNOSIS — R197 Diarrhea, unspecified: Secondary | ICD-10-CM | POA: Diagnosis not present

## 2018-08-28 ENCOUNTER — Inpatient Hospital Stay (HOSPITAL_COMMUNITY)
Admission: AD | Admit: 2018-08-28 | Discharge: 2018-09-06 | DRG: 872 | Disposition: A | Discharge status: Skilled Nursing Facility | Payer: Medicaid Other | Source: Other Acute Inpatient Hospital | Attending: Internal Medicine | Admitting: Internal Medicine

## 2018-08-28 DIAGNOSIS — A0472 Enterocolitis due to Clostridium difficile, not specified as recurrent: Secondary | ICD-10-CM | POA: Diagnosis present

## 2018-08-28 DIAGNOSIS — I5022 Chronic systolic (congestive) heart failure: Secondary | ICD-10-CM | POA: Diagnosis present

## 2018-08-28 DIAGNOSIS — Z882 Allergy status to sulfonamides status: Secondary | ICD-10-CM

## 2018-08-28 DIAGNOSIS — Z888 Allergy status to other drugs, medicaments and biological substances status: Secondary | ICD-10-CM | POA: Diagnosis not present

## 2018-08-28 DIAGNOSIS — Z7982 Long term (current) use of aspirin: Secondary | ICD-10-CM

## 2018-08-28 DIAGNOSIS — K5010 Crohn's disease of large intestine without complications: Secondary | ICD-10-CM | POA: Diagnosis present

## 2018-08-28 DIAGNOSIS — R627 Adult failure to thrive: Secondary | ICD-10-CM | POA: Diagnosis present

## 2018-08-28 DIAGNOSIS — E871 Hypo-osmolality and hyponatremia: Secondary | ICD-10-CM | POA: Diagnosis present

## 2018-08-28 DIAGNOSIS — Z79899 Other long term (current) drug therapy: Secondary | ICD-10-CM

## 2018-08-28 DIAGNOSIS — R197 Diarrhea, unspecified: Secondary | ICD-10-CM | POA: Diagnosis present

## 2018-08-28 DIAGNOSIS — T380X5A Adverse effect of glucocorticoids and synthetic analogues, initial encounter: Secondary | ICD-10-CM | POA: Diagnosis not present

## 2018-08-28 DIAGNOSIS — E860 Dehydration: Secondary | ICD-10-CM | POA: Diagnosis present

## 2018-08-28 DIAGNOSIS — Z825 Family history of asthma and other chronic lower respiratory diseases: Secondary | ICD-10-CM

## 2018-08-28 DIAGNOSIS — Z7951 Long term (current) use of inhaled steroids: Secondary | ICD-10-CM

## 2018-08-28 DIAGNOSIS — E099 Drug or chemical induced diabetes mellitus without complications: Secondary | ICD-10-CM | POA: Diagnosis present

## 2018-08-28 DIAGNOSIS — Z8701 Personal history of pneumonia (recurrent): Secondary | ICD-10-CM | POA: Diagnosis not present

## 2018-08-28 DIAGNOSIS — Z87891 Personal history of nicotine dependence: Secondary | ICD-10-CM | POA: Diagnosis not present

## 2018-08-28 DIAGNOSIS — R531 Weakness: Secondary | ICD-10-CM | POA: Diagnosis not present

## 2018-08-28 DIAGNOSIS — Z91040 Latex allergy status: Secondary | ICD-10-CM

## 2018-08-28 DIAGNOSIS — Z886 Allergy status to analgesic agent status: Secondary | ICD-10-CM | POA: Diagnosis not present

## 2018-08-28 DIAGNOSIS — I110 Hypertensive heart disease with heart failure: Secondary | ICD-10-CM | POA: Diagnosis not present

## 2018-08-28 DIAGNOSIS — R109 Unspecified abdominal pain: Secondary | ICD-10-CM

## 2018-08-28 DIAGNOSIS — A414 Sepsis due to anaerobes: Principal | ICD-10-CM | POA: Diagnosis present

## 2018-08-28 DIAGNOSIS — I10 Essential (primary) hypertension: Secondary | ICD-10-CM | POA: Diagnosis not present

## 2018-08-28 DIAGNOSIS — E785 Hyperlipidemia, unspecified: Secondary | ICD-10-CM | POA: Diagnosis not present

## 2018-08-28 DIAGNOSIS — D649 Anemia, unspecified: Secondary | ICD-10-CM | POA: Diagnosis not present

## 2018-08-28 DIAGNOSIS — Z90710 Acquired absence of both cervix and uterus: Secondary | ICD-10-CM | POA: Diagnosis not present

## 2018-08-28 DIAGNOSIS — I2510 Atherosclerotic heart disease of native coronary artery without angina pectoris: Secondary | ICD-10-CM | POA: Diagnosis not present

## 2018-08-28 DIAGNOSIS — J432 Centrilobular emphysema: Secondary | ICD-10-CM | POA: Diagnosis not present

## 2018-08-28 DIAGNOSIS — R5381 Other malaise: Secondary | ICD-10-CM | POA: Diagnosis not present

## 2018-08-28 DIAGNOSIS — K5090 Crohn's disease, unspecified, without complications: Secondary | ICD-10-CM | POA: Diagnosis present

## 2018-08-28 DIAGNOSIS — K5931 Toxic megacolon: Secondary | ICD-10-CM

## 2018-08-28 DIAGNOSIS — I959 Hypotension, unspecified: Secondary | ICD-10-CM | POA: Diagnosis not present

## 2018-08-28 DIAGNOSIS — R601 Generalized edema: Secondary | ICD-10-CM | POA: Diagnosis not present

## 2018-08-28 HISTORY — DX: Enterocolitis due to Clostridium difficile, not specified as recurrent: A04.72

## 2018-08-28 LAB — COMPREHENSIVE METABOLIC PANEL WITH GFR
ALT: 14 U/L (ref 0–44)
AST: 9 U/L — ABNORMAL LOW (ref 15–41)
Albumin: 1.8 g/dL — ABNORMAL LOW (ref 3.5–5.0)
Alkaline Phosphatase: 86 U/L (ref 38–126)
Anion gap: 12 (ref 5–15)
BUN: 5 mg/dL — ABNORMAL LOW (ref 6–20)
CO2: 21 mmol/L — ABNORMAL LOW (ref 22–32)
Calcium: 7.8 mg/dL — ABNORMAL LOW (ref 8.9–10.3)
Chloride: 96 mmol/L — ABNORMAL LOW (ref 98–111)
Creatinine, Ser: 0.7 mg/dL (ref 0.44–1.00)
GFR calc Af Amer: >60 mL/min
GFR calc non Af Amer: >60 mL/min
Glucose, Bld: 110 mg/dL — ABNORMAL HIGH (ref 70–99)
Potassium: 3.4 mmol/L — ABNORMAL LOW (ref 3.5–5.1)
Sodium: 129 mmol/L — ABNORMAL LOW (ref 135–145)
Total Bilirubin: 0.6 mg/dL (ref 0.3–1.2)
Total Protein: 4.6 g/dL — ABNORMAL LOW (ref 6.5–8.1)

## 2018-08-28 LAB — CBC
HCT: 30.5 % — ABNORMAL LOW (ref 36.0–46.0)
Hemoglobin: 9.8 g/dL — ABNORMAL LOW (ref 12.0–15.0)
MCH: 32 pg (ref 26.0–34.0)
MCHC: 32.1 g/dL (ref 30.0–36.0)
MCV: 99.7 fL (ref 80.0–100.0)
Platelets: 332 K/uL (ref 150–400)
RBC: 3.06 MIL/uL — ABNORMAL LOW (ref 3.87–5.11)
RDW: 13.6 % (ref 11.5–15.5)
WBC: 16.7 K/uL — ABNORMAL HIGH (ref 4.0–10.5)
nRBC: 0 % (ref 0.0–0.2)

## 2018-08-28 LAB — PHOSPHORUS: Phosphorus: 3.7 mg/dL (ref 2.5–4.6)

## 2018-08-28 LAB — LACTIC ACID, PLASMA: Lactic Acid, Venous: 1.3 mmol/L (ref 0.5–1.9)

## 2018-08-28 LAB — GLUCOSE, CAPILLARY
Glucose-Capillary: 105 mg/dL — ABNORMAL HIGH (ref 70–99)
Glucose-Capillary: 107 mg/dL — ABNORMAL HIGH (ref 70–99)

## 2018-08-28 LAB — MAGNESIUM: Magnesium: 1.5 mg/dL — ABNORMAL LOW (ref 1.7–2.4)

## 2018-08-28 MED ORDER — PANTOPRAZOLE SODIUM 40 MG PO TBEC
40.0000 mg | DELAYED_RELEASE_TABLET | Freq: Every day | ORAL | Status: DC
  Administered 2018-08-28: 40 mg via ORAL
  Filled 2018-08-28: qty 1

## 2018-08-28 MED ORDER — IPRATROPIUM-ALBUTEROL 0.5-2.5 (3) MG/3ML IN SOLN: 3.0000 mL | Freq: Four times a day (QID) | RESPIRATORY_TRACT | Status: DC | PRN

## 2018-08-28 MED ORDER — ONDANSETRON HCL 4 MG/2ML IJ SOLN
4.0000 mg | Freq: Four times a day (QID) | INTRAMUSCULAR | Status: DC | PRN
  Administered 2018-08-29 – 2018-09-04 (×7): 4 mg via INTRAVENOUS
  Filled 2018-08-28 (×9): qty 2

## 2018-08-28 MED ORDER — SODIUM CHLORIDE 0.9 % IV SOLN
INTRAVENOUS | Status: DC
  Administered 2018-08-28: 22:00:00 via INTRAVENOUS

## 2018-08-28 MED ORDER — INSULIN ASPART 100 UNIT/ML ~~LOC~~ SOLN
0.0000 [IU] | SUBCUTANEOUS | Status: DC
  Administered 2018-08-29 – 2018-09-01 (×3): 1 [IU] via SUBCUTANEOUS
  Administered 2018-09-03: 14:00:00 3 [IU] via SUBCUTANEOUS
  Administered 2018-09-03: 17:00:00 2 [IU] via SUBCUTANEOUS
  Administered 2018-09-04: 1 [IU] via SUBCUTANEOUS
  Administered 2018-09-04: 2 [IU] via SUBCUTANEOUS
  Administered 2018-09-05 – 2018-09-06 (×4): 1 [IU] via SUBCUTANEOUS

## 2018-08-28 MED ORDER — MIRTAZAPINE 15 MG PO TABS
30.0000 mg | ORAL_TABLET | Freq: Every day | ORAL | Status: DC
  Administered 2018-08-28 – 2018-09-05 (×9): 30 mg via ORAL
  Filled 2018-08-28 (×9): qty 2

## 2018-08-28 MED ORDER — ACETAMINOPHEN 650 MG RE SUPP: 650.0000 mg | Freq: Four times a day (QID) | RECTAL | Status: DC | PRN

## 2018-08-28 MED ORDER — MONTELUKAST SODIUM 10 MG PO TABS
10.0000 mg | ORAL_TABLET | Freq: Every day | ORAL | Status: DC
  Administered 2018-08-28 – 2018-09-05 (×9): 10 mg via ORAL
  Filled 2018-08-28 (×9): qty 1

## 2018-08-28 MED ORDER — VANCOMYCIN 50 MG/ML ORAL SOLUTION
500.0000 mg | Freq: Four times a day (QID) | ORAL | Status: DC
  Administered 2018-08-28 – 2018-09-06 (×35): 500 mg via ORAL
  Filled 2018-08-28 (×37): qty 10

## 2018-08-28 MED ORDER — MOMETASONE FURO-FORMOTEROL FUM 200-5 MCG/ACT IN AERO
2.0000 | INHALATION_SPRAY | Freq: Two times a day (BID) | RESPIRATORY_TRACT | Status: DC
  Administered 2018-08-28 – 2018-09-06 (×17): 2 via RESPIRATORY_TRACT
  Filled 2018-08-28: qty 8.8

## 2018-08-28 MED ORDER — TRAZODONE HCL 50 MG PO TABS
50.0000 mg | ORAL_TABLET | Freq: Every evening | ORAL | Status: DC | PRN
  Administered 2018-09-03 – 2018-09-04 (×2): 50 mg via ORAL
  Filled 2018-08-28 (×2): qty 1

## 2018-08-28 MED ORDER — SODIUM CHLORIDE 0.9 % IV SOLN
2.0000 g | INTRAVENOUS | Status: DC
  Administered 2018-08-28 – 2018-08-29 (×2): 2 g via INTRAVENOUS
  Filled 2018-08-28 (×3): qty 20

## 2018-08-28 MED ORDER — METRONIDAZOLE IN NACL 5-0.79 MG/ML-% IV SOLN
500.0000 mg | Freq: Three times a day (TID) | INTRAVENOUS | Status: DC
  Administered 2018-08-28 – 2018-09-04 (×20): 500 mg via INTRAVENOUS
  Filled 2018-08-28 (×19): qty 100

## 2018-08-28 MED ORDER — ATORVASTATIN CALCIUM 10 MG PO TABS
10.0000 mg | ORAL_TABLET | Freq: Every day | ORAL | Status: DC
  Administered 2018-08-29 – 2018-09-05 (×8): 10 mg via ORAL
  Filled 2018-08-28 (×8): qty 1

## 2018-08-28 MED ORDER — ASPIRIN 81 MG PO CHEW
81.0000 mg | CHEWABLE_TABLET | Freq: Every day | ORAL | Status: DC
  Administered 2018-08-29 – 2018-09-06 (×9): 81 mg via ORAL
  Filled 2018-08-28 (×9): qty 1

## 2018-08-28 MED ORDER — ENOXAPARIN SODIUM 40 MG/0.4ML ~~LOC~~ SOLN
40.0000 mg | SUBCUTANEOUS | Status: DC
  Administered 2018-08-28 – 2018-09-05 (×9): 40 mg via SUBCUTANEOUS
  Filled 2018-08-28 (×9): qty 0.4

## 2018-08-28 MED ORDER — ACETAMINOPHEN 325 MG PO TABS
650.0000 mg | ORAL_TABLET | Freq: Four times a day (QID) | ORAL | Status: DC | PRN
  Administered 2018-08-29: 650 mg via ORAL
  Filled 2018-08-28: qty 2

## 2018-08-28 NOTE — H&P (Signed)
History and Physical    Kathy Lawson DZH:[id number] DOB: May 13, 1961 DOA: 08/28/2018  PCP: Damita Dunnings, NP (Inactive) Patient coming from: [facility]  Chief Complaint: Diarrhea  Brief narrative per Dr. Loleta Books (hospitalist at [facility]): Kathy Lawson is a 59 y.o. female with medical history significant of COPD not on home O2, recent respiratory failure requiring intubation, Crohn's disease not on maintenance therapy, recent C diff colitis, HTN, DM, and sCHF EF 35-40% who presented to [facility] with recurrent diarrhea. Patient admitted to this hospital 2 months ago for respiratory failure from COPD flare and pneumonia: was intubated 2/13 to 2/23.  During that time, she failed an initial extubation attempt after 5 days, ultimately was intubated for 10 days.  Treated with antibiotics for CAP and steroids.  She was diagnosed with new onset CHF with EF 35-40% during that hospitalization.  She was discharged to SNF for rehab, where she developed frequent watery diarrhea, was diagnosed with Cdiff, started on oral vancomycin. She completed 6 days oral vanc 125 q6hrs by the time of SNF discharge, but unfortunately, was not able to obtain oral vancomycin at discharge. Within a few days after getting home and stopping vancomycin, she began to have 6-10 watery stools per day, abdominal cramps and slowly progressive weakness.   Admited with WBC >20K, lactic acid 2 mmol/L, BP 98/50 mmHg.  Restarted on oral vancomycin 125 mg q6hrs.  Stool positive for antigen+toxin.  In absence of completed/adequate initial therapy, this was not considered "recurrent" C diff. Patient initially improved (reduced stool frequency, WBC improved). However, after 48 hours, stool frequency increased to >10x per day watery stool, cramps returned, patient became persistently tachycardiac 120s.   On day of discharge, WBC back up to 17K.  CT abdomen and pelvis with IV contrast obtained that showed 7.8cm transverse  colon, diffuse colonic stranding, and thumbprinting, consistent with toxic megacolon. PO Vancomycin increased to 500 mg q6hrs, IV Flagyl 500 mg q8hrs added, as well as empiric IV vancomycin and ceftriaxone.  General Surgery were consulted, who recommended continued medical therapy.  In the absence of critical care capabilities in this patient with recent prolonged hypoxic respiratory failure, LVSD with EF 35%, who is currently tachycardic and may have toxic megacolon, transfer was requested to [facility].  HPI: Patient states she has a history of C. difficile and ran out of her medication.  For the past 10 days she has been having at least 10 episodes of watery, nonbloody diarrhea per day.  She is also having intermittent lower abdominal sharp stabbing pains.  States she initially had a fever when her illness started 10 days ago but no recent fevers.  Reports improvement in her abdominal pain since she has been in the hospital.  Denies any abdominal pain at present.  States she has been feeling nauseous but has not vomited.  Labs done at [facility]: 08/28/18 11:28: POC Capillary Glucose 222 H 08/28/18 11:03: Lactic Acid 1.4 08/28/18 05:46: POC Capillary Glucose 82 08/28/18 05:17: WBC 17.1 H, RBC 3.24 L, Hgb 10.7 L, Hct 32.8 L, MCV 101 H, MCH 33.1 H, MCHC 32.8 L, RDW 14.5, Plt Count 369, MPV 7.7, Neut % (Auto) Cancelled, Lymph % (Auto) Cancelled, Mono % (Auto) Cancelled, Eos % (Auto) Cancelled, Baso % (Auto) Cancelled, Absolute Neuts (auto) Cancelled, Absolute Lymphs (auto) Cancelled, Seg Neuts % (Manual) 63, Band Neutrophils % 25 H, Lymphocytes % (Manual) 9 L, Monocytes % (Manual) 2, Eosinophils % (Manual) 1, Absolute Neutrophils 15.05 H, Absolute Lymphocytes 1.54, Toxic Granulation 1+,  Platelet Estimate Norm, RBC Morphology 1+ aniso 08/28/18 05:17: Sodium 127 L, Potassium 3.7, Chloride 94 L, Carbon Dioxide 24, Anion Gap 13, BUN 9, Creatinine 0.60, Estimated GFR (MDRD) > 60, Glucose 67 L, Calculated  Osmolality 242 L, Calcium 8.1 L 08/28/18 05:07: POC Capillary Glucose 61 L  Imaging at [facility]:  CT ABDOMEN AND PELVIS WITH CONTRAST  TECHNIQUE: Multidetector CT imaging of the abdomen and pelvis was performed using the standard protocol following bolus administration of intravenous contrast.  CONTRAST:  100 cc Isovue 370 IV.  COMPARISON:  None.  FINDINGS: Lower chest: Trace dependent right pleural effusion. Centrilobular emphysema at the lung bases.  Hepatobiliary: Normal liver size. No liver mass. Cholecystectomy. Bile ducts are within normal post cholecystectomy limits with CBD diameter 7 mm.  Pancreas: Normal, with no mass or duct dilation.  Spleen: Normal size. No mass.  Adrenals/Urinary Tract: Normal adrenals. No hydronephrosis. A few scattered subcentimeter hypodense renal cortical lesions in the left kidney are too small to characterize and require no follow-up. Normal bladder.  Stomach/Bowel: Normal non-distended stomach. Normal caliber small bowel with no small bowel wall thickening. Oral contrast transits to rectum. Appendix not discretely visualized. There is marked circumferential wall thickening throughout the colon and rectum with thumbprinting with associated diffuse pericolonic fat stranding. No significant colonic diverticulosis. No pneumatosis. Diffuse large bowel dilatation with maximum large-bowel diameter 7.8 cm in the transverse colon. Large fat containing midline ventral abdominal hernia.  Vascular/Lymphatic: Atherosclerotic nonaneurysmal abdominal aorta. Patent portal, splenic, hepatic and renal veins. No pathologically enlarged lymph nodes in the abdomen or pelvis.  Reproductive: Status post hysterectomy, with no abnormal findings at the vaginal cuff. No adnexal mass.  Other: Trace ascites. No pneumoperitoneum. No focal fluid collection.  Musculoskeletal: No aggressive appearing focal osseous lesions. Marked lower lumbar  spondylosis.  IMPRESSION: 1. Severe pancolitis with toxic megacolon appearance, compatible with the reported history of C diff colitis. No pneumatosis or pneumoperitoneum. 2. Trace dependent right pleural effusion. 3. Large midline fat containing ventral abdominal hernia. 4. Aortic Atherosclerosis (ICD10-I70.0) and Emphysema (ICD10-J43.9).   Electronically Signed   By: Ilona Sorrel M.D.   On: 08/28/2018 08:21  Review of Systems: As per HPI otherwise 10 point review of systems negative.  Past Medical History:  Diagnosis Date   Emphysema/COPD ([place])    Hyperlipemia    Hypertension    Vertigo     Past Surgical History:  Procedure Laterality Date   CHOLECYSTECTOMY  1980s   hysterectomy  [place]     reports that she has been smoking cigarettes. She started smoking about 44 years ago. She has been smoking about 1.50 packs per day. She has never used smokeless tobacco. She reports current alcohol use. She reports that she does not use drugs.  Allergies  Allergen Reactions   Lasix [Furosemide]     Causes hyponatremia    Meloxicam    Other     Sulfonamide (substance)    Latex Rash    Family History  Problem Relation Age of Onset   Emphysema Father    Allergies Sister    Rheum arthritis Mother    Skin cancer Mother     Prior to Admission medications   Medication Sig Start Date End Date Taking? Authorizing Provider  albuterol (PROVENTIL HFA;VENTOLIN HFA) 108 (90 BASE) MCG/ACT inhaler Inhale 2 puffs into the lungs. Every 2-3 hours as needed    [provider]  albuterol (PROVENTIL) (2.5 MG/3ML) 0.083% nebulizer solution Take 2.5  mg by nebulization every 6 (six) hours as needed for wheezing or shortness of breath.    [provider]  aspirin 81 MG tablet Take 81 mg by mouth daily.    [provider]  atorvastatin (LIPITOR) 10 MG tablet Take 10 mg by mouth daily.    [provider]  budesonide-formoterol  (SYMBICORT) 160-4.5 MCG/ACT inhaler Inhale 2 puffs into the lungs 2 (two) times daily. 10/04/13   Elsie Stain, MD  Calcium Carbonate-Vit D-Min (CALCIUM 1200 PO) Take by mouth daily.    [provider]  calcium-vitamin D (OSCAL) 250-125 MG-UNIT per tablet Take 1 tablet by mouth 2 (two) times daily.    [provider]  cetirizine (ZYRTEC) 10 MG tablet Take 10 mg by mouth daily.    [provider]  Cholecalciferol (VITAMIN D PO) Take by mouth daily.    [provider]  diltiazem (CARDIZEM CD) 180 MG 24 hr capsule Take 1 capsule (180 mg total) by mouth daily. 07/29/18   Richardo Priest, MD  Diphenoxylate HCl POWD 2 tablets by Does not apply route daily.    [provider]  diphenoxylate-atropine (LOMOTIL) 2.5-0.025 MG per tablet Take 2 tablets by mouth 3 (three) times daily as needed for diarrhea or loose stools.    [provider]  escitalopram (LEXAPRO) 10 MG tablet Take 10 mg by mouth daily.    [provider]  fluticasone (FLONASE) 50 MCG/ACT nasal spray Place 2 sprays into both nostrils daily as needed for allergies or rhinitis.    [provider]  indomethacin (INDOCIN) 50 MG capsule Take 50 mg by mouth 2 (two) times daily.    [provider]  lisinopril-hydrochlorothiazide (PRINZIDE,ZESTORETIC) 20-12.5 MG per tablet Take 1 tablet by mouth daily.    [provider]  metoprolol succinate (TOPROL-XL) 25 MG 24 hr tablet Take 25 mg by mouth daily.    [provider]  mometasone (NASONEX) 50 MCG/ACT nasal spray Place 1 spray into the nose 2 (two) times daily.    [provider]  montelukast (SINGULAIR) 10 MG tablet Take 10 mg by mouth at bedtime.    [provider]  omeprazole (PRILOSEC) 40 MG capsule Take 40 mg by mouth daily.    [provider]  tiotropium (SPIRIVA) 18 MCG inhalation capsule Place 18 mcg into inhaler and inhale daily.    [provider]     Physical Exam: Vitals:   08/28/18 1624 08/28/18 2057  BP: 140/75 131/83  Pulse: (!) 107 (!) 102  Resp: 18   Temp: 98.1 F (36.7 C) 98.7 F (37.1 C)  TempSrc: Oral Oral  SpO2: 99% 99%  Weight: 72.4 kg   Height: 5' 1"  (1.549 m)     Physical Exam  Constitutional: She is oriented to person, place, and time. She appears well-developed and well-nourished. No distress.  HENT:  Head: Normocephalic.  Eyes: Right eye exhibits no discharge. Left eye exhibits no discharge.  Neck: Neck supple.  Cardiovascular: Regular rhythm and intact distal pulses.  Slightly tachycardic  Pulmonary/Chest: Effort normal and breath sounds normal. No respiratory distress. She has no wheezes. She has no rales.  Abdominal: Soft. Bowel sounds are normal. She exhibits distension. There is no abdominal tenderness. There is no rebound and no guarding.  Midline abdominal wall hernia reducible on exam  Musculoskeletal:        General: Edema present.     Comments: +2 pitting edema of bilateral lower extremities  Neurological: She is alert  and oriented to person, place, and time.  Skin: Skin is warm and dry. She is not diaphoretic.     Labs on Admission: I have personally reviewed following labs and imaging studies  CBC: No results for input(s): WBC, NEUTROABS, HGB, HCT, MCV, PLT in the last 168 hours. Basic Metabolic Panel: No results for input(s): NA, K, CL, CO2, GLUCOSE, BUN, CREATININE, CALCIUM, MG, PHOS in the last 168 hours. GFR: CrCl cannot be calculated (No successful lab value found.). Liver Function Tests: No results for input(s): AST, ALT, ALKPHOS, BILITOT, PROT, ALBUMIN in the last 168 hours. No results for input(s): LIPASE, AMYLASE in the last 168 hours. No results for input(s): AMMONIA in the last 168 hours. Coagulation Profile: No results for input(s): INR, PROTIME in the last 168 hours. Cardiac Enzymes: No results for input(s): CKTOTAL, CKMB, CKMBINDEX, TROPONINI in the last 168  hours. BNP (last 3 results) No results for input(s): PROBNP in the last 8760 hours. HbA1C: No results for input(s): HGBA1C in the last 72 hours. CBG: No results for input(s): GLUCAP in the last 168 hours. Lipid Profile: No results for input(s): CHOL, HDL, LDLCALC, TRIG, CHOLHDL, LDLDIRECT in the last 72 hours. Thyroid Function Tests: No results for input(s): TSH, T4TOTAL, FREET4, T3FREE, THYROIDAB in the last 72 hours. Anemia Panel: No results for input(s): VITAMINB12, FOLATE, FERRITIN, TIBC, IRON, RETICCTPCT in the last 72 hours. Urine analysis: No results found for: COLORURINE, APPEARANCEUR, LABSPEC, PHURINE, GLUCOSEU, HGBUR, BILIRUBINUR, KETONESUR, PROTEINUR, UROBILINOGEN, NITRITE, LEUKOCYTESUR  Radiological Exams on Admission: No results found.  Assessment/Plan Principal Problem:   C. 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  Restarted on oral vancomycin 125 mg q6hrs.  Stool positive for antigen+toxin.  In absence of completed/adequate initial therapy, this was not considered "recurrent" C diff. Patient initially improved (reduced stool frequency, WBC improved). However, after 48 hours, stool frequency increased to >10x per day watery stool, cramps returned, patient became persistently tachycardiac 120s.  This morning, WBC back up to 17K.  CT abdomen and pelvis with IV contrast obtained that showed 7.8cm transverse colon, diffuse colonic stranding, and thumbprinting, consistent with toxic megacolon. -General surgery has been consulted -Continue po vancomycin 500 q6 -Continue IV Flagyl 500 q8 -Continue ceftriaxone 1 g every 24 hours -Zofran PRN nausea -PPI for stress ulcer prophylaxis -Continue IV fluids -Bowel rest -Repeat abdominal x-ray in a.m. -Monitor electrolytes every 12  hours  Crohn's disease Per [facility] records: Follows with Dr. Melina Copa, GI.  Not on maintenance therapy.  No hx colectomy or resection or fistula. Patient reportedly diagnosed around 25.  No biopsy results available, but by 2011 endoscopy report (which appears to be the most recent, per General Surgery at [facility], who share records with her primary GI specialist, Dr. Melina Copa), she had pancolitis, consistent with Crohn's at that time.  Patient reported flares of diarrhea about 1x per year, these are treated by her PCP with a steroid burst, and resolve.  Reported having tried a maintenance medicine 15 or 20 years ago around the time that she was diagnosed, but did not continue it and is on no maintenance therapy now.    COPD The patient was admitted for respiratory failure in Feb to this hospital.  Intubated and treated with antibiotics and steroids.  Failed extubation after 5 days, ultimately required 10  days intubation. Discharged to SNF.  She has quit smoking since then.  She was on a prednisone taper for 4 weeks following that hospitalization, has tapered off in the last 5 days. -Dulera 2 puffs twice daily -Continue Singulair  Hypertension BP soft at [place] done on arrival here showing improvement in blood pressure. -Hold home antihypertensives at this time -Continue to monitor blood pressure closely  New onset diabetes No previous history, had been treated with sliding scale corrections at her rehab facility, not started on orals at discharge from rehab.  Likely prednisone induced.  HgbA1c checked at [facility] 9.6%. Started on Lantus, but persisten AM hypogylcemia, so this was stopped at outside hospital. -Continue low dose SSI correction insulin and CBG checks  Hyponatremia Likely secondary to decreased p.o. intake and GI loss.  Baseline sodium 130.  Sodium checked at [facility] this morning 127. -Continue IV fluid hydration -Continue to monitor BMP every 12  hours  Chronic systolic CHF New diagnosis during February admission for respiratory failure.  No ischemic work up yet.  She has bilateral lower extremity edema but no rales on auscultation of lungs.  No signs of respiratory distress. -Hold home diuretics at this time in the setting of ongoing diarrhea -close monitoring I/Os -hold Coreg, ARB for now  Hyperlipidemia -Continue Crestor  Other medications -continue mirtazapine 30 mg nightly -continue trazodone 50 qhs PRN  DVT prophylaxis: Lovenox  Code Status: Full code Family Communication: No family available. Disposition Plan: Anticipate discharge after clinical improvement. Consults called: General surgery Admission status: It is my clinical opinion that admission to INPATIENT is reasonable and necessary in this 58 y.o. female presenting with severe C diff colitis with suspected toxic megacolon.  Treatment plan mentioned above.  Given the aforementioned, the predictability of an adverse outcome is felt to be significant. I expect that the patient will require at least 2 midnights in the hospital to treat this condition.   This chart was dictated using voice recognition software.  Despite best efforts to proofread, errors can occur which can change the documentation meaning.  Shela Leff MD [facility] Pager [phone]  If 7PM-7AM, please contact night-coverage [email or web address] Password [facility]  08/28/2018, 9:37 PM

## 2018-08-28 NOTE — Consult Note (Signed)
Surgical Consultation Requesting provider: Dr. Marlowe Sax  CC: c diff colitis  HPI: This is a chronically ill 58 year old woman with a medical history notable for COPD, recent respiratory failure requiring intubation, Crohn's disease not on maintenance therapy, recent C. difficile colitis, hypertension, diabetes, and congestive heart failure with an ejection fraction of 35%.  She presented with recurrent diarrheato [facility], where she was admitted 2 months ago for respiratory failure from COPD and pneumonia and was intubated for 10 days.  She was treated with antibiotics during this time and this is when she developed new onset congestive heart failure.  She was discharged to SNF for rehab, where she developed frequent watery stools, was diagnosed with C. difficile and started on oral vancomycin.  She completed a 6-day course but unfortunately when she left the staff was not able to fill her prescription and so did not complete the full course of treatment for C. difficile.  Thus her symptoms returned.  On initial presentation, she had a white count over 20, mild hypotension, and stool was positive for antigen and toxin of C. difficile.  She was restarted on oral vancomycin and initially improved however after 48 hours stool frequency drastically increased, cramps returned and she became persistently tachycardic to the 120s.  Her white count went back up to about 17.  A CT scan was performed this morning that showed dilated colon up to 7.8 cm in the transverse, diffuse colonic stranding, thumbprinting, consistent with toxic megacolon.  Her p.o. vancomycin has been increased, IV Flagyl has been added as well as IV vancomycin and Rocephin.  She was transferred for further care due to increased critical care capabilities at this facility.  Right now she denies abdominal pain.  Denies nausea.  States that she has not had a bowel movement since 330 yesterday.  Allergies  Allergen Reactions  . Lasix  [Furosemide]     Causes hyponatremia   . Meloxicam   . Other     Sulfonamide (substance)   . Latex Rash    Past Medical History:  Diagnosis Date  . Emphysema/COPD ([place])   . Hyperlipemia   . Hypertension   . Vertigo     Past Surgical History:  Procedure Laterality Date  . CHOLECYSTECTOMY  1980s  . hysterectomy  1999  . TUBAL LIGATION  1997    Family History  Problem Relation Age of Onset  . Emphysema Father   . Allergies Sister   . Rheum arthritis Mother   . Skin cancer Mother     Social History   Socioeconomic History  . Marital status: Widowed    Spouse name: Not on file  . Number of children: Not on file  . Years of education: Not on file  . Highest education level: Not on file  Occupational History  . Not on file  Social Needs  . Financial resource strain: Not on file  . Food insecurity:    Worry: Not on file    Inability: Not on file  . Transportation needs:    Medical: Not on file    Non-medical: Not on file  Tobacco Use  . Smoking status: Current Every Day Smoker    Packs/day: 1.50    Types: Cigarettes    Start date: 05/19/1974  . Smokeless tobacco: Never Used  Substance and Sexual Activity  . Alcohol use: Yes    Comment: 5-6 beers couple times weekly  . Drug use: No  . Sexual activity: Not on file  Lifestyle  .  Physical activity:    Days per week: Not on file    Minutes per session: Not on file  . Stress: Not on file  Relationships  . Social connections:    Talks on phone: Not on file    Gets together: Not on file    Attends religious service: Not on file    Active member of club or organization: Not on file    Attends meetings of clubs or organizations: Not on file    Relationship status: Not on file  Other Topics Concern  . Not on file  Social History Narrative  . Not on file    No current facility-administered medications on file prior to encounter.    Current Outpatient Medications on File Prior to Encounter  Medication Sig  Dispense Refill  . albuterol (PROVENTIL HFA;VENTOLIN HFA) 108 (90 BASE) MCG/ACT inhaler Inhale 2 puffs into the lungs. Every 2-3 hours as needed    . albuterol (PROVENTIL) (2.5 MG/3ML) 0.083% nebulizer solution Take 2.5 mg by nebulization every 6 (six) hours as needed for wheezing or shortness of breath.    Marland Kitchen aspirin 81 MG tablet Take 81 mg by mouth daily.    Marland Kitchen atorvastatin (LIPITOR) 10 MG tablet Take 10 mg by mouth daily.    . budesonide-formoterol (SYMBICORT) 160-4.5 MCG/ACT inhaler Inhale 2 puffs into the lungs 2 (two) times daily. 1 Inhaler 12  . Calcium Carbonate-Vit D-Min (CALCIUM 1200 PO) Take by mouth daily.    . calcium-vitamin D (OSCAL) 250-125 MG-UNIT per tablet Take 1 tablet by mouth 2 (two) times daily.    . cetirizine (ZYRTEC) 10 MG tablet Take 10 mg by mouth daily.    . Cholecalciferol (VITAMIN D PO) Take by mouth daily.    Marland Kitchen diltiazem (CARDIZEM CD) 180 MG 24 hr capsule Take 1 capsule (180 mg total) by mouth daily. 30 capsule 1  . Diphenoxylate HCl POWD 2 tablets by Does not apply route daily.    . diphenoxylate-atropine (LOMOTIL) 2.5-0.025 MG per tablet Take 2 tablets by mouth 3 (three) times daily as needed for diarrhea or loose stools.    Marland Kitchen escitalopram (LEXAPRO) 10 MG tablet Take 10 mg by mouth daily.    . fluticasone (FLONASE) 50 MCG/ACT nasal spray Place 2 sprays into both nostrils daily as needed for allergies or rhinitis.    . indomethacin (INDOCIN) 50 MG capsule Take 50 mg by mouth 2 (two) times daily.    Marland Kitchen lisinopril-hydrochlorothiazide (PRINZIDE,ZESTORETIC) 20-12.5 MG per tablet Take 1 tablet by mouth daily.    . metoprolol succinate (TOPROL-XL) 25 MG 24 hr tablet Take 25 mg by mouth daily.    . mometasone (NASONEX) 50 MCG/ACT nasal spray Place 1 spray into the nose 2 (two) times daily.    . montelukast (SINGULAIR) 10 MG tablet Take 10 mg by mouth at bedtime.    Marland Kitchen omeprazole (PRILOSEC) 40 MG capsule Take 40 mg by mouth daily.    Marland Kitchen tiotropium (SPIRIVA) 18 MCG inhalation  capsule Place 18 mcg into inhaler and inhale daily.      Review of Systems: a complete, 10pt review of systems was completed with pertinent positives and negatives as documented in the HPI  Physical Exam: Vitals:   08/28/18 1624 08/28/18 2057  BP: 140/75 131/83  Pulse: (!) 107 (!) 102  Resp: 18   Temp: 98.1 F (36.7 C) 98.7 F (37.1 C)  SpO2: 99% 99%   Gen: A&Ox3, no distress  Head: normocephalic, atraumatic Eyes: extraocular motions intact, anicteric.  Neck: supple without mass or thyromegaly Chest: unlabored respirations, symmetrical air entry  Cardiovascular: RRR, pitting edema bilateral lower extremities Abdomen: soft, moderately distended, diffusely mildly tender.  Partially reducible umbilical hernia containing fat Extremities: warm, no deformities  Neuro: grossly intact Psych: appropriate mood and affect, normal insight  Skin: warm and dry   No flowsheet data found.  No flowsheet data found.  No results found for: INR, PROTIME  Imaging: Imaging at [facility]:  CT ABDOMEN AND PELVIS WITH CONTRAST  TECHNIQUE: Multidetector CT imaging of the abdomen and pelvis was performed using the standard protocol following bolus administration of intravenous contrast.  CONTRAST:  100 cc Isovue 370 IV.  COMPARISON:  None.  FINDINGS: Lower chest: Trace dependent right pleural effusion. Centrilobular emphysema at the lung bases.  Hepatobiliary: Normal liver size. No liver mass. Cholecystectomy. Bile ducts are within normal post cholecystectomy limits with CBD diameter 7 mm.  Pancreas: Normal, with no mass or duct dilation.  Spleen: Normal size. No mass.  Adrenals/Urinary Tract: Normal adrenals. No hydronephrosis. A few scattered subcentimeter hypodense renal cortical lesions in the left kidney are too small to characterize and require no follow-up. Normal bladder.  Stomach/Bowel: Normal non-distended stomach. Normal caliber small bowel with no small  bowel wall thickening. Oral contrast transits to rectum. Appendix not discretely visualized. There is marked circumferential wall thickening throughout the colon and rectum with thumbprinting with associated diffuse pericolonic fat stranding. No significant colonic diverticulosis. No pneumatosis. Diffuse large bowel dilatation with maximum large-bowel diameter 7.8 cm in the transverse colon. Large fat containing midline ventral abdominal hernia.  Vascular/Lymphatic: Atherosclerotic nonaneurysmal abdominal aorta. Patent portal, splenic, hepatic and renal veins. No pathologically enlarged lymph nodes in the abdomen or pelvis.  Reproductive: Status post hysterectomy, with no abnormal findings at the vaginal cuff. No adnexal mass.  Other: Trace ascites. No pneumoperitoneum. No focal fluid collection.  Musculoskeletal: No aggressive appearing focal osseous lesions. Marked lower lumbar spondylosis.  IMPRESSION: 1. Severe pancolitis with toxic megacolon appearance, compatible with the reported history of C diff colitis. No pneumatosis or pneumoperitoneum. 2. Trace dependent right pleural effusion. 3. Large midline fat containing ventral abdominal hernia. 4. Aortic Atherosclerosis (ICD10-I70.0) and Emphysema (ICD10-J43.9).   Electronically Signed   By: Ilona Sorrel M.D.   On: 08/28/2018 08:21  A/P: 58yo chronically ill woman with c diff colitis.   At this point would recommend continued medical therapy with escalated antibiotic regimen.  Repeat plain film of the abdomen in the morning and will follow her abdominal exams and her clinical progress.  Surgery reserved for failure of medical therapy or clinical decompensation.  I discussed with the patient that if she were to require surgery this would entail essentially a total colectomy with end ileostomy and her risk of complications and death is high.  History of Crohn's disease not on maintenance therapy, no surgical history for  this.  Followed by Dr. Melina Copa. ??  History of pancolitis consistent with Crohn's flares?  COPD, with recent admission requiring 10 days of ventilator therapy for respiratory failure and pneumonia.  Recently quit smoking.  She has tapered off steroids in the last 5 days.  Hypertension, new onset diabetes, chronic systolic heart failure, hyponatremia.   Romana Juniper, MD [facility] Surgery, [place] Pager [phone]

## 2018-08-29 ENCOUNTER — Inpatient Hospital Stay (HOSPITAL_COMMUNITY): Payer: Medicaid Other

## 2018-08-29 DIAGNOSIS — K5010 Crohn's disease of large intestine without complications: Secondary | ICD-10-CM | POA: Diagnosis present

## 2018-08-29 LAB — GLUCOSE, CAPILLARY
Glucose-Capillary: 124 mg/dL — ABNORMAL HIGH (ref 70–99)
Glucose-Capillary: 71 mg/dL (ref 70–99)
Glucose-Capillary: 75 mg/dL (ref 70–99)
Glucose-Capillary: 84 mg/dL (ref 70–99)
Glucose-Capillary: 86 mg/dL (ref 70–99)
Glucose-Capillary: 90 mg/dL (ref 70–99)

## 2018-08-29 LAB — HIV ANTIBODY (ROUTINE TESTING W REFLEX): HIV Screen 4th Generation wRfx: NONREACTIVE

## 2018-08-29 MED ORDER — MAGNESIUM SULFATE 2 GM/50ML IV SOLN
2.0000 g | Freq: Once | INTRAVENOUS | Status: AC
  Administered 2018-08-29: 2 g via INTRAVENOUS
  Filled 2018-08-29: qty 50

## 2018-08-29 MED ORDER — HYDROCODONE-ACETAMINOPHEN 5-325 MG PO TABS
1.0000 | ORAL_TABLET | Freq: Two times a day (BID) | ORAL | Status: DC | PRN
  Administered 2018-08-29 – 2018-09-06 (×7): 1 via ORAL
  Filled 2018-08-29 (×8): qty 1

## 2018-08-29 MED ORDER — POTASSIUM CHLORIDE CRYS ER 20 MEQ PO TBCR
40.0000 meq | EXTENDED_RELEASE_TABLET | Freq: Once | ORAL | Status: AC
  Administered 2018-08-29: 40 meq via ORAL
  Filled 2018-08-29: qty 2

## 2018-08-29 MED ORDER — CHLORHEXIDINE GLUCONATE 0.12 % MT SOLN
15.0000 mL | Freq: Two times a day (BID) | OROMUCOSAL | Status: DC
  Administered 2018-08-29 – 2018-09-06 (×13): 15 mL via OROMUCOSAL
  Filled 2018-08-29 (×13): qty 15

## 2018-08-29 MED ORDER — LACTATED RINGERS IV SOLN: INTRAVENOUS | Status: AC

## 2018-08-29 MED ORDER — ORAL CARE MOUTH RINSE
15.0000 mL | Freq: Two times a day (BID) | OROMUCOSAL | Status: DC
  Administered 2018-08-29 – 2018-09-05 (×14): 15 mL via OROMUCOSAL

## 2018-08-29 MED ORDER — FAMOTIDINE 20 MG PO TABS
40.0000 mg | ORAL_TABLET | Freq: Every day | ORAL | Status: DC
  Administered 2018-08-29 – 2018-09-06 (×9): 40 mg via ORAL
  Filled 2018-08-29 (×9): qty 2

## 2018-08-29 MED ORDER — POTASSIUM CHLORIDE CRYS ER 20 MEQ PO TBCR
40.0000 meq | EXTENDED_RELEASE_TABLET | Freq: Once | ORAL | Status: AC
  Administered 2018-08-29: 09:00:00 40 meq via ORAL
  Filled 2018-08-29: qty 2

## 2018-08-29 MED ORDER — POTASSIUM CHLORIDE 2 MEQ/ML IV SOLN
INTRAVENOUS | Status: DC
  Administered 2018-08-29 – 2018-08-30 (×2): via INTRAVENOUS
  Filled 2018-08-29 (×3): qty 1000

## 2018-08-29 MED ORDER — MAGNESIUM SULFATE 2 GM/50ML IV SOLN
2.0000 g | Freq: Once | INTRAVENOUS | Status: AC
  Administered 2018-08-29: 09:00:00 2 g via INTRAVENOUS

## 2018-08-29 NOTE — Progress Notes (Signed)
Kathy Lawson [id number] 1961-04-26  CARE TEAM:  PCP: Damita Dunnings, NP (Inactive)  Outpatient Care Team: Patient Care Team: Damita Dunnings, NP (Inactive) as PCP - General Elsie Stain, MD as Consulting Physician (Pulmonary Disease)  Inpatient Treatment Team: Treatment Team: Attending Provider: Thurnell Lose, MD; Consulting Physician: Nolon Nations, MD; Rounding Team: Sonda Primes, MD; Registered Nurse: Wylene Men, RN   Problem List:   Principal Problem:   C. 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 difficile colitis improving with hospitalization  [facility] Stay = 1 days)  Plan:  -Continue antibiotics.  IV Flagyl with enteral vancomycin.  Agree with at least 2-week course.  I believe Dr. Candiss Norse with medicine is planning 3-week taper -Since having bowel movements under better control and pain is less, reasonable to do clear liquid diet.  Agree with medicine. -History of Crohn's colitis followed by Dr. Melina Copa with gastroenterology.  Most likely complicating the picture. -VTE prophylaxis- SCDs, etc -mobilize as tolerated to help recovery  The patient is stable.  There is no evidence of peritonitis, acute abdomen, nor shock.  There is no strong evidence of failure of improvement nor decline with current non-operative management.  There is no need for surgery at the present moment.   We will continue to follow.   20 minutes spent in review, evaluation, examination, counseling, and coordination of care.  More than 50% of that time was spent in counseling.  08/29/2018    Subjective: (Chief complaint)  Pain much less.  No severe diarrhea  Objective:  Vital signs:  Vitals:   08/28/18 1624 08/28/18 2057 08/29/18 0523  BP: 140/75 131/83 (!) 89/74   Pulse: (!) 107 (!) 102 (!) 116  Resp: 18    Temp: 98.1 F (36.7 C) 98.7 F (37.1 C) 99.8 F (37.7 C)  TempSrc: Oral Oral Oral  SpO2: 99% 99% 97%  Weight: 72.4 kg    Height: 5' 1"  (1.549 m)      Last BM Date: 08/28/18  Intake/Output   Yesterday:  No intake/output data recorded. This shift:  No intake/output data recorded.  Bowel function:  Flatus: YES  BM:  YES  Drain: (No drain)   Physical Exam:  General: Pt awake/alert/oriented x4 in no acute distress.  Sitting in chair.  Calm.  Relaxed. Eyes: PERRL, normal EOM.  Sclera clear.  No icterus Neuro: CN II-XII intact w/o focal sensory/motor deficits. Lymph: No head/neck/groin lymphadenopathy Psych:  No delerium/psychosis/paranoia HENT: Normocephalic, Mucus membranes moist.  No thrush Neck: Supple, No tracheal deviation Chest: No chest wall pain w good excursion CV:  Pulses intact.  Regular rhythm MS: Normal AROM mjr joints.  No obvious deformity  Abdomen: Soft.  Mildy distended.  Mild diffuse tenderness.  Obese.  No evidence of peritonitis.  No incarcerated hernias.  Ext:   No deformity.  No mjr edema.  No cyanosis Skin: No petechiae / purpura  Results:   Labs: Results for orders placed or performed during the hospital encounter of 08/28/18 (from the past 48 hour(s))  Magnesium     Status: Abnormal   Collection Time: 08/28/18  9:33 PM  Result Value Ref Range   Magnesium 1.5 (L) 1.7 - 2.4 mg/dL    Comment: Performed at  [facility], [place] [address], [place], [place] [address]  Phosphorus     Status: None   Collection Time: 08/28/18  9:33 PM  Result Value Ref Range   Phosphorus 3.7 2.5 - 4.6 mg/dL    Comment: Performed at [facility], [place] [address], [place], [place] [address]  CBC     Status: Abnormal   Collection Time: 08/28/18  9:33 PM  Result Value Ref Range   WBC 16.7 (H) 4.0 - 10.5 K/uL   RBC 3.06 (L) 3.87 - 5.11 MIL/uL   Hemoglobin 9.8 (L) 12.0 - 15.0 g/dL   HCT 30.5 (L) 36.0 - 46.0 %    MCV 99.7 80.0 - 100.0 fL   MCH 32.0 26.0 - 34.0 pg   MCHC 32.1 30.0 - 36.0 g/dL   RDW 13.6 11.5 - 15.5 %   Platelets 332 150 - 400 K/uL   nRBC 0.0 0.0 - 0.2 %    Comment: Performed at [facility], [place] [address], [place], [place] [address]  Comprehensive metabolic panel     Status: Abnormal   Collection Time: 08/28/18  9:33 PM  Result Value Ref Range   Sodium 129 (L) 135 - 145 mmol/L   Potassium 3.4 (L) 3.5 - 5.1 mmol/L   Chloride 96 (L) 98 - 111 mmol/L   CO2 21 (L) 22 - 32 mmol/L   Glucose, Bld 110 (H) 70 - 99 mg/dL   BUN 5 (L) 6 - 20 mg/dL   Creatinine, Ser 0.70 0.44 - 1.00 mg/dL   Calcium 7.8 (L) 8.9 - 10.3 mg/dL   Total Protein 4.6 (L) 6.5 - 8.1 g/dL   Albumin 1.8 (L) 3.5 - 5.0 g/dL   AST 9 (L) 15 - 41 U/L   ALT 14 0 - 44 U/L   Alkaline Phosphatase 86 38 - 126 U/L   Total Bilirubin 0.6 0.3 - 1.2 mg/dL   GFR calc non Af Amer >60 >60 mL/min   GFR calc Af Amer >60 >60 mL/min   Anion gap 12 5 - 15    Comment: Performed at [facility], [place] [address], [place], [place] [address]  Lactic acid, plasma     Status: None   Collection Time: 08/28/18  9:35 PM  Result Value Ref Range   Lactic Acid, Venous 1.3 0.5 - 1.9 mmol/L    Comment: Performed at [facility] [address] [place], [place] [address]  Glucose, capillary     Status: Abnormal   Collection Time: 08/28/18 10:33 PM  Result Value Ref Range   Glucose-Capillary 107 (H) 70 - 99 mg/dL  Glucose, capillary     Status: Abnormal   Collection Time: 08/28/18 11:44 PM  Result Value Ref Range   Glucose-Capillary 105 (H) 70 - 99 mg/dL  Glucose, capillary     Status: None   Collection Time: 08/29/18  3:58 AM  Result Value Ref Range   Glucose-Capillary 71 70 - 99 mg/dL  Glucose, capillary     Status: None   Collection Time: 08/29/18  8:07 AM  Result Value Ref Range   Glucose-Capillary 84 70 - 99 mg/dL    Imaging / Studies: Dg Abd 2 Views  Result Date: 08/29/2018 CLINICAL DATA:  Toxic megacolon due to  C diff colitis EXAM: ABDOMEN - 2 VIEW COMPARISON:  None. FINDINGS: No disproportionately dilated small bowel loops. Mild stool versus thumbprinting in the right colon. No significant colonic dilatation. No evidence of pneumatosis  or pneumoperitoneum. Cholecystectomy clips are seen in the right upper quadrant of the abdomen. Excreted IV contrast is seen within the bladder. Clear lung bases. No radiopaque nephrolithiasis. IMPRESSION: Nonobstructive bowel gas pattern. Mild stool versus thumbprinting due to colitis in the right colon. Electronically Signed   By: Ilona Sorrel M.D.   On: 08/29/2018 09:02    Medications / Allergies: per chart  Antibiotics: Anti-infectives (From admission, onward)   Start     Dose/Rate Route Frequency Ordered Stop   08/28/18 2200  vancomycin (VANCOCIN) 50 mg/mL oral solution 500 mg     500 mg Oral Every 6 hours 08/28/18 2057 09/11/18 2359   08/28/18 2200  metroNIDAZOLE (FLAGYL) IVPB 500 mg     500 mg 100 mL/hr over 60 Minutes Intravenous Every 8 hours 08/28/18 2057 09/11/18 2159   08/28/18 2200  cefTRIAXone (ROCEPHIN) 2 g in sodium chloride 0.9 % 100 mL IVPB     2 g 200 mL/hr over 30 Minutes Intravenous Every 24 hours 08/28/18 2110          Note: Portions of this report may have been transcribed using voice recognition software. Every effort was made to ensure accuracy; however, inadvertent computerized transcription errors may be present.   Any transcriptional errors that result from this process are unintentional.     Adin Hector, MD, FACS, MASCRS Gastrointestinal and Minimally Invasive Surgery    [address] [place], [place] [address] [phone] Main / Paging [phone] Fax

## 2018-08-29 NOTE — Progress Notes (Addendum)
PROGRESS NOTE                                                                                                                                                                                                             Patient Demographics:    Kathy Lawson, is a 58 y.o. female, DOB - February 23, 1961, QZE:[id number]  Admit date - 08/28/2018   Admitting Physician Shela Leff, MD  Outpatient Primary MD for the patient is Damita Dunnings, NP (Inactive)  LOS - 1  CC - C diff     Brief Narrative  Kathy Lawson is a 58 y.o. female with medical history significant of COPD not on home O2, recent respiratory failure requiring intubation, Crohn's disease not on maintenance therapy, recent C diff colitis, HTN, DM, and sCHF EF 35-40% who presented to [facility] with recurrent diarrhea.  She was admitted to this hospital for pneumonia requiring intubation and thereafter discharged to SNF in mid February of this year.  There she developed C. difficile colitis, she was discharged from SNF on oral vancomycin which she was unable to procure, few days later her diarrhea worsened, presented to [facility] ER on 08/28/2018 was diagnosed with sepsis due to severe C. difficile colitis and transferred here.   Subjective:    Kathy Lawson today has, No headache, No chest pain, mild generalized abdominal pain - No Nausea, No new weakness tingling or numbness, No Cough - SOB.     Assessment  & Plan :     1.  Sepsis due to severe C. difficile colitis.  She was diagnosed with C. difficile colitis at an SNF few weeks ago but was unable to get vancomycin upon discharge to home.  She is currently on IV Flagyl and oral vancomycin and already doing better, repeat x-ray on 08/29/2018 much improved.  Clinically much improved.  Placed on clear liquids and continue to monitor clinically.  Upon discharge we will give her 3 weeks of oral vancomycin taper, please make sure patient has vancomycin  prescriptions and medication available prior to discharge.  Clinically sepsis pathophysiology has now resolved.  2.  HX of Crohn's colitis.  Follows with Dr. Melina Copa at [facility].  She usually gets treated once a year for Crohn's flare by steroid burst.  Currently no evidence of Crohn's flareup.  She is responding to vancomycin continue to monitor.  3.  Underlying COPD.  Outpatient steroid taper due to a recent flare, continue nebulizer treatments as before.  Symptom-free.  No shortness of  breath.  4.  Hypertension.  Blood pressure currently low due to dehydration from diarrhea, IV fluid bolus x1 on 08/29/2018 followed by maintenance and monitor.  5.  Hyponatremia due to GI loss.  Improving with hydration.  6.  Systolic CHF EF around 10%.  On Coreg and ARB which are on hold due to low blood pressure and diarrhea induced dehydration, currently being hydrated.  Readdress fluid status on 08/30/2018.  7.  Dyslipidemia.  On Crestor.    Family Communication  :  None  Code Status :  Full  Disposition Plan  :  Step down  Consults  :  CCS  Procedures  :    CT - 1. Severe pancolitis with toxic megacolon appearance, compatible with the reported history of C diff colitis. No pneumatosis or pneumoperitoneum. 2. Trace dependent right pleural effusion. 3. Large midline fat containing ventral abdominal hernia. 4. Aortic Atherosclerosis  DVT Prophylaxis  :  Lovenox    Lab Results  Component Value Date   PLT 332 08/28/2018    Diet :  Diet Order            Diet clear liquid Room service appropriate? Yes; Fluid consistency: Thin  Diet effective now               Inpatient Medications Scheduled Meds: . aspirin  81 mg Oral Daily  . atorvastatin  10 mg Oral q1800  . enoxaparin (LOVENOX) injection  40 mg Subcutaneous Q24H  . famotidine  40 mg Oral Daily  . insulin aspart  0-9 Units Subcutaneous Q4H  . mirtazapine  30 mg Oral QHS  . mometasone-formoterol  2 puff Inhalation BID  .  montelukast  10 mg Oral QHS  . potassium chloride  40 mEq Oral Once  . vancomycin  500 mg Oral Q6H   Continuous Infusions: . cefTRIAXone (ROCEPHIN)  IV 2 g (08/28/18 2349)  . lactated ringers with kcl 75 mL/hr at 08/29/18 0831  . lactated ringers    . magnesium sulfate 1 - 4 g bolus IVPB    . metronidazole 500 mg (08/29/18 0502)   PRN Meds:.ipratropium-albuterol, ondansetron (ZOFRAN) IV, traZODone  Antibiotics  :   Anti-infectives (From admission, onward)   Start     Dose/Rate Route Frequency Ordered Stop   08/28/18 2200  vancomycin (VANCOCIN) 50 mg/mL oral solution 500 mg     500 mg Oral Every 6 hours 08/28/18 2057 09/11/18 2359   08/28/18 2200  metroNIDAZOLE (FLAGYL) IVPB 500 mg     500 mg 100 mL/hr over 60 Minutes Intravenous Every 8 hours 08/28/18 2057 09/11/18 2159   08/28/18 2200  cefTRIAXone (ROCEPHIN) 2 g in sodium chloride 0.9 % 100 mL IVPB     2 g 200 mL/hr over 30 Minutes Intravenous Every 24 hours 08/28/18 2110            Objective:   Vitals:   08/28/18 1624 08/28/18 2057 08/29/18 0523  BP: 140/75 131/83 (!) 89/74  Pulse: (!) 107 (!) 102 (!) 116  Resp: 18    Temp: 98.1 F (36.7 C) 98.7 F (37.1 C) 99.8 F (37.7 C)  TempSrc: Oral Oral Oral  SpO2: 99% 99% 97%  Weight: 72.4 kg    Height: 5' 1"  (1.549 m)      Wt Readings from Last 3 Encounters:  08/28/18 72.4 kg  07/29/18 78.5 kg  11/29/13 67.1 kg    No intake or output data in the 24 hours ending 08/29/18 0921   Physical Exam  Awake Alert, Oriented X 3, No new F.N deficits, Normal affect [place].AT,PERRAL Supple Neck,No JVD, No cervical lymphadenopathy appriciated.  Symmetrical Chest wall movement, Good air movement bilaterally, CTAB RRR,No Gallops,Rubs or new Murmurs, No Parasternal Heave +ve B.Sounds, Abd Soft, mild tenderness, No organomegaly appriciated, No rebound - guarding or rigidity. No Cyanosis, Clubbing or edema, No new Rash or bruise       Data Review:    CBC Recent Labs  Lab  08/28/18 2133  WBC 16.7*  HGB 9.8*  HCT 30.5*  PLT 332  MCV 99.7  MCH 32.0  MCHC 32.1  RDW 13.6    Chemistries  Recent Labs  Lab 08/28/18 2133  NA 129*  K 3.4*  CL 96*  CO2 21*  GLUCOSE 110*  BUN 5*  CREATININE 0.70  CALCIUM 7.8*  MG 1.5*  AST 9*  ALT 14  ALKPHOS 86  BILITOT 0.6   ------------------------------------------------------------------------------------------------------------------ No results for input(s): CHOL, HDL, LDLCALC, TRIG, CHOLHDL, LDLDIRECT in the last 72 hours.  No results found for: HGBA1C ------------------------------------------------------------------------------------------------------------------ No results for input(s): TSH, T4TOTAL, T3FREE, THYROIDAB in the last 72 hours.  Invalid input(s): FREET3 ------------------------------------------------------------------------------------------------------------------ No results for input(s): VITAMINB12, FOLATE, FERRITIN, TIBC, IRON, RETICCTPCT in the last 72 hours.  Coagulation profile No results for input(s): INR, PROTIME in the last 168 hours.  No results for input(s): DDIMER in the last 72 hours.  Cardiac Enzymes No results for input(s): CKMB, TROPONINI, MYOGLOBIN in the last 168 hours.  Invalid input(s): CK ------------------------------------------------------------------------------------------------------------------ No results found for: BNP  Micro Results No results found for this or any previous visit (from the past 240 hour(s)).  Radiology Reports Dg Abd 2 Views  Result Date: 08/29/2018 CLINICAL DATA:  Toxic megacolon due to C diff colitis EXAM: ABDOMEN - 2 VIEW COMPARISON:  None. FINDINGS: No disproportionately dilated small bowel loops. Mild stool versus thumbprinting in the right colon. No significant colonic dilatation. No evidence of pneumatosis or pneumoperitoneum. Cholecystectomy clips are seen in the right upper quadrant of the abdomen. Excreted IV contrast is seen  within the bladder. Clear lung bases. No radiopaque nephrolithiasis. IMPRESSION: Nonobstructive bowel gas pattern. Mild stool versus thumbprinting due to colitis in the right colon. Electronically Signed   By: Ilona Sorrel M.D.   On: 08/29/2018 09:02    Time Spent in minutes  30   Lala Lund M.D on 08/29/2018 at 9:21 AM  To page go to [email or web address] - password [facility]

## 2018-08-30 ENCOUNTER — Encounter (HOSPITAL_COMMUNITY): Payer: Self-pay | Admitting: General Practice

## 2018-08-30 ENCOUNTER — Other Ambulatory Visit: Payer: Self-pay

## 2018-08-30 DIAGNOSIS — I5022 Chronic systolic (congestive) heart failure: Secondary | ICD-10-CM

## 2018-08-30 DIAGNOSIS — I2510 Atherosclerotic heart disease of native coronary artery without angina pectoris: Secondary | ICD-10-CM

## 2018-08-30 DIAGNOSIS — I10 Essential (primary) hypertension: Secondary | ICD-10-CM

## 2018-08-30 DIAGNOSIS — K5010 Crohn's disease of large intestine without complications: Secondary | ICD-10-CM

## 2018-08-30 DIAGNOSIS — E871 Hypo-osmolality and hyponatremia: Secondary | ICD-10-CM | POA: Diagnosis present

## 2018-08-30 LAB — BASIC METABOLIC PANEL
Anion gap: 10 (ref 5–15)
BUN: <5 mg/dL — ABNORMAL LOW (ref 6–20)
CO2: 20 mmol/L — ABNORMAL LOW (ref 22–32)
Calcium: 7.4 mg/dL — ABNORMAL LOW (ref 8.9–10.3)
Chloride: 103 mmol/L (ref 98–111)
Creatinine, Ser: 0.7 mg/dL (ref 0.44–1.00)
GFR calc Af Amer: >60 mL/min (ref >60)
GFR calc non Af Amer: >60 mL/min (ref >60)
Glucose, Bld: 67 mg/dL — ABNORMAL LOW (ref 70–99)
Potassium: 4.3 mmol/L (ref 3.5–5.1)
Sodium: 133 mmol/L — ABNORMAL LOW (ref 135–145)

## 2018-08-30 LAB — GLUCOSE, CAPILLARY
Glucose-Capillary: 70 mg/dL (ref 70–99)
Glucose-Capillary: 73 mg/dL (ref 70–99)
Glucose-Capillary: 64 mg/dL — ABNORMAL LOW (ref 70–99)
Glucose-Capillary: 98 mg/dL (ref 70–99)
Glucose-Capillary: 74 mg/dL (ref 70–99)
Glucose-Capillary: 107 mg/dL — ABNORMAL HIGH (ref 70–99)

## 2018-08-30 LAB — CBC
HCT: 27.4 % — ABNORMAL LOW (ref 36.0–46.0)
Hemoglobin: 9 g/dL — ABNORMAL LOW (ref 12.0–15.0)
MCH: 33 pg (ref 26.0–34.0)
MCHC: 32.8 g/dL (ref 30.0–36.0)
MCV: 100.4 fL — ABNORMAL HIGH (ref 80.0–100.0)
Platelets: 326 10*3/uL (ref 150–400)
RBC: 2.73 MIL/uL — ABNORMAL LOW (ref 3.87–5.11)
RDW: 13.8 % (ref 11.5–15.5)
WBC: 10 10*3/uL (ref 4.0–10.5)
nRBC: 0 % (ref 0.0–0.2)

## 2018-08-30 LAB — PHOSPHORUS: Phosphorus: 2.9 mg/dL (ref 2.5–4.6)

## 2018-08-30 LAB — MAGNESIUM: Magnesium: 2.1 mg/dL (ref 1.7–2.4)

## 2018-08-30 MED ORDER — ACETAMINOPHEN 325 MG PO TABS
650.0000 mg | ORAL_TABLET | Freq: Four times a day (QID) | ORAL | Status: DC | PRN
  Administered 2018-08-30 – 2018-09-05 (×5): 650 mg via ORAL
  Filled 2018-08-30 (×5): qty 2

## 2018-08-30 MED ORDER — METOPROLOL TARTRATE 12.5 MG HALF TABLET
12.5000 mg | ORAL_TABLET | Freq: Two times a day (BID) | ORAL | Status: DC
  Administered 2018-08-30 – 2018-09-04 (×11): 12.5 mg via ORAL
  Filled 2018-08-30 (×11): qty 1

## 2018-08-30 MED ORDER — SODIUM CHLORIDE 0.9 % IV SOLN
INTRAVENOUS | Status: DC
  Administered 2018-08-30: 13:00:00 via INTRAVENOUS

## 2018-08-30 NOTE — Progress Notes (Signed)
   Subjective/Chief Complaint: PT with some pain tol CLD   Objective: Vital signs in last 24 hours: Temp:  [97.7 F (36.5 C)-98.6 F (37 C)] 98.6 F (37 C) (04/13 0442) Pulse Rate:  [106-116] 107 (04/13 0442) Resp:  [18] 18 (04/12 1330) BP: (104-134)/(50-92) 128/65 (04/13 0442) SpO2:  [95 %-98 %] 97 % (04/13 0442) Last BM Date: 08/29/18  Intake/Output from previous day: 04/12 0701 - 04/13 0700 In: 1610.7 [I.V.:1016.2; IV Piggyback:594.5] Out: 475 [Urine:475] Intake/Output this shift: No intake/output data recorded.  Constitutional: No acute distress, conversant, appears states age. Eyes: Anicteric sclerae, moist conjunctiva, no lid lag Lungs: Clear to auscultation bilaterally, normal respiratory effort CV: regular rate and rhythm, no murmurs, no peripheral edema, pedal pulses 2+ GI: Soft, no masses or hepatosplenomegaly, recurrent UH, min ttp BLQ Skin: No rashes, palpation reveals normal turgor Psychiatric: appropriate judgment and insight, oriented to person, place, and time   Lab Results:  Recent Labs    08/28/18 2133 08/30/18 0315  WBC 16.7* 10.0  HGB 9.8* 9.0*  HCT 30.5* 27.4*  PLT 332 326   BMET Recent Labs    08/28/18 2133 08/30/18 0315  NA 129* 133*  K 3.4* 4.3  CL 96* 103  CO2 21* 20*  GLUCOSE 110* 67*  BUN 5* <5*  CREATININE 0.70 0.70  CALCIUM 7.8* 7.4*   PT/INR No results for input(s): LABPROT, INR in the last 72 hours. ABG No results for input(s): PHART, HCO3 in the last 72 hours.  Invalid input(s): PCO2, PO2  Studies/Results: Dg Abd 2 Views  Result Date: 08/29/2018 CLINICAL DATA:  Toxic megacolon due to C diff colitis EXAM: ABDOMEN - 2 VIEW COMPARISON:  None. FINDINGS: No disproportionately dilated small bowel loops. Mild stool versus thumbprinting in the right colon. No significant colonic dilatation. No evidence of pneumatosis or pneumoperitoneum. Cholecystectomy clips are seen in the right upper quadrant of the abdomen. Excreted IV  contrast is seen within the bladder. Clear lung bases. No radiopaque nephrolithiasis. IMPRESSION: Nonobstructive bowel gas pattern. Mild stool versus thumbprinting due to colitis in the right colon. Electronically Signed   By: Ilona Sorrel M.D.   On: 08/29/2018 09:02    Anti-infectives: Anti-infectives (From admission, onward)   Start     Dose/Rate Route Frequency Ordered Stop   08/28/18 2200  vancomycin (VANCOCIN) 50 mg/mL oral solution 500 mg     500 mg Oral Every 6 hours 08/28/18 2057 09/11/18 2359   08/28/18 2200  metroNIDAZOLE (FLAGYL) IVPB 500 mg     500 mg 100 mL/hr over 60 Minutes Intravenous Every 8 hours 08/28/18 2057 09/11/18 2159   08/28/18 2200  cefTRIAXone (ROCEPHIN) 2 g in sodium chloride 0.9 % 100 mL IVPB     2 g 200 mL/hr over 30 Minutes Intravenous Every 24 hours 08/28/18 2110        Assessment/Plan: 57yo chronically ill woman with c diff colitis.  -Con't abx -no plans for surgery as patient improving -OK to adv to full liquids -mobilize -following   LOS: 2 days    Ralene Ok 08/30/2018

## 2018-08-30 NOTE — Progress Notes (Signed)
Rectal pouch applied; observed MASD to buttocks and sacrum prior to applying pouch.

## 2018-08-30 NOTE — Progress Notes (Signed)
PROGRESS NOTE  Kathy Lawson JSE:[id number] DOB: [birth year] DOA: 08/28/2018 PCP: Damita Dunnings, NP (Inactive)   LOS: 2 days   Brief Narrative / Interim history: Kathy Lawson a 58 y.o.femalewith medical history significant ofCOPD not on home O2, recent respiratory failure requiring intubation, Crohn's disease not on maintenance therapy, recent C diff colitis, HTN, DM, and sCHF EF 35-40% [facility].  She was admitted to this hospital for pneumonia requiring intubation and thereafter discharged to SNF in mid February of this year.  There she developed C. difficile colitis, she was discharged from SNF on oral vancomycin which she was unable to procure, few days later her diarrhea worsened, presented to [facility] ER on 08/28/2018 was diagnosed with sepsis due to severe C. difficile colitis and transferred here.   Subjective: No major events overnight of this morning.  Some improvement in abdominal pain.  Reports nausea.  Denies emesis.  Had about 3 bowel movements yesterday and 1 bowel movement overnight.  Not sure about the consistency.  Tolerating clear liquid diet.  Denies chest pain or dyspnea.  Noticeable leg swelling but improving.  Assessment & Plan: Principal Problem:   C. 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 difficile colitis: Sepsis physiology resolving.  Clinically improving.  Leukocytosis resolved. -Continue p.o. vancomycin 08/28/18---> -IV Flagyl 08/28/18--> -Zofran for nausea -Decrease IV fluid to 50 cc/h in the setting of CHF and leg edema. -Closely monitor electrolytes. -Discontinue ceftriaxone.  Unclear indication of this at this point. -General surgery following and advancing diet to full liquid diet.  History of Crohn's disease: Followed by Dr. Melina Copa at [facility].  Currently no evidence of flareup.  History of CAD: Stable.  No anginal symptoms. -Resume home metoprolol at low-dose given tachycardia. -Continue atorvastatin and aspirin.  Systolic CHF with EF of about 45%. No cardiopulmonary symptoms but 1+ pitting edema bilaterally.  Edema improving per patient. -Daily weight, intake output and renal function. -Decrease IV fluid to 50 cc/h -Resume low-dose metoprolol. -Continue holding lisinopril and HCTZ. -Noted patient is on diltiazem.  Clarify if there is history of A. fib.   Chronic COPD: Stable.  No respiratory symptoms. -Continue home medication and PRN nebulizers.  Hypertension: Normotensive this morning. -IV fluid as above -Cardiac meds as above.  Hyponatremia: Improving. -Change LR to NS.  Scheduled Meds: . aspirin  81 mg Oral Daily  . atorvastatin  10 mg Oral q1800  . chlorhexidine  15 mL Mouth Rinse BID  . enoxaparin (LOVENOX) injection  40 mg Subcutaneous Q24H  . famotidine  40 mg Oral Daily  . insulin aspart  0-9 Units Subcutaneous Q4H  . mouth rinse  15 mL Mouth Rinse q12n4p  . mirtazapine  30 mg Oral QHS  . mometasone-formoterol  2 puff Inhalation BID  . montelukast  10 mg Oral QHS  . vancomycin  500 mg Oral Q6H   Continuous Infusions: . cefTRIAXone (ROCEPHIN)  IV 2 g (08/29/18 2206)  . lactated ringers with kcl 75 mL/hr at 08/30/18 0942  . metronidazole 500 mg (08/30/18 0649)   PRN Meds:.HYDROcodone-acetaminophen, ipratropium-albuterol, ondansetron (ZOFRAN) IV, traZODone  DVT prophylaxis: Subcu Lovenox Code Status: Full code Family Communication: None at bedside Disposition Plan: Remains inpatient for sepsis due to C. Difficile.   Consultants:   General surgery  Procedures:   None  Antimicrobials:  As above  Objective:  Vitals:   08/29/18 1915 08/29/18 2104 08/30/18 0442 08/30/18 0900  BP:  (!) 104/50 128/65   Pulse:  (!) 106 (!) 107   Resp:      Temp:  98.4 F (36.9 C) 98.6 F (37 C)   TempSrc:   Oral Oral   SpO2: 95% 97% 97% 94%  Weight:      Height:        Intake/Output Summary (Last 24 hours) at 08/30/2018 1126 Last data filed at 08/30/2018 0500 Gross per 24 hour  Intake 1610.67 ml  Output 475 ml  Net 1135.67 ml   Filed Weights   08/28/18 1624  Weight: 72.4 kg    Examination:  GENERAL: Appears well. No acute distress.  EYES - vision grossly intact. Sclera anicteric.  NOSE- no gross deformity or drainage MOUTH - no oral lesions noted THROAT- no swelling or erythema LUNGS:  No IWOB. Good air movement. CTAB.  HEART: Regular rhythm.  Tachycardic to 100s.Marland Kitchen Heart sounds normal. ABD: Bowel sounds present. Soft.  Some tenderness over RUQ. MSK/EXT: Moves extremities. No obvious deformity.  1-2 pitting edema bilaterally. SKIN: no apparent skin lesion.  NEURO: Awake, alert and oriented appropriately.  No gross deficit.  PSYCH: Calm. Normal affect.    Data Reviewed: I have independently reviewed following labs and imaging studies  CBC: Recent Labs  Lab 08/28/18 2133 08/30/18 0315  WBC 16.7* 10.0  HGB 9.8* 9.0*  HCT 30.5* 27.4*  MCV 99.7 100.4*  PLT 332 100   Basic Metabolic Panel: Recent Labs  Lab 08/28/18 2133 08/30/18 0315  NA 129* 133*  K 3.4* 4.3  CL 96* 103  CO2 21* 20*  GLUCOSE 110* 67*  BUN 5* <5*  CREATININE 0.70 0.70  CALCIUM 7.8* 7.4*  MG 1.5* 2.1  PHOS 3.7 2.9   GFR: Estimated Creatinine Clearance: 69.7 mL/min (by C-G formula based on SCr of 0.7 mg/dL). Liver Function Tests: Recent Labs  Lab 08/28/18 2133  AST 9*  ALT 14  ALKPHOS 86  BILITOT 0.6  PROT 4.6*  ALBUMIN 1.8*   No results for input(s): LIPASE, AMYLASE in the last 168 hours. No results for input(s): AMMONIA in the last 168 hours. Coagulation Profile: No results for input(s): INR, PROTIME in the last 168 hours. Cardiac Enzymes: No results for input(s): CKTOTAL, CKMB, CKMBINDEX, TROPONINI in the last 168 hours. BNP (last 3 results) No results for input(s): PROBNP in  the last 8760 hours. HbA1C: No results for input(s): HGBA1C in the last 72 hours. CBG: Recent Labs  Lab 08/29/18 1644 08/29/18 2021 08/29/18 2354 08/30/18 0438 08/30/18 0802  GLUCAP 90 86 75 70 73   Lipid Profile: No results for input(s): CHOL, HDL, LDLCALC, TRIG, CHOLHDL, LDLDIRECT in the last 72 hours. Thyroid Function Tests: No results for input(s): TSH, T4TOTAL, FREET4, T3FREE, THYROIDAB in the last 72 hours. Anemia Panel: No results for input(s): VITAMINB12, FOLATE, FERRITIN, TIBC, IRON, RETICCTPCT in the last 72 hours. Urine analysis: No results found for: COLORURINE, APPEARANCEUR, LABSPEC, PHURINE, GLUCOSEU, HGBUR, BILIRUBINUR, KETONESUR, PROTEINUR, UROBILINOGEN, NITRITE, LEUKOCYTESUR Sepsis Labs: Invalid input(s): PROCALCITONIN, LACTICIDVEN  No results found for this or any previous visit (from the past 240 hour(s)).    Radiology Studies: No results found.   T. [facility] Pager [phone]  If 7PM-7AM, please contact night-coverage [email or web address] Password [facility] 08/30/2018, 11:26 AM

## 2018-08-31 LAB — CBC
HCT: 28.7 % — ABNORMAL LOW (ref 36.0–46.0)
Hemoglobin: 9 g/dL — ABNORMAL LOW (ref 12.0–15.0)
MCH: 31.6 pg (ref 26.0–34.0)
MCHC: 31.4 g/dL (ref 30.0–36.0)
MCV: 100.7 fL — ABNORMAL HIGH (ref 80.0–100.0)
Platelets: 310 10*3/uL (ref 150–400)
RBC: 2.85 MIL/uL — ABNORMAL LOW (ref 3.87–5.11)
RDW: 13.9 % (ref 11.5–15.5)
WBC: 7.1 10*3/uL (ref 4.0–10.5)
nRBC: 0.3 % — ABNORMAL HIGH (ref 0.0–0.2)

## 2018-08-31 LAB — BASIC METABOLIC PANEL
Anion gap: 8 (ref 5–15)
BUN: <5 mg/dL — ABNORMAL LOW (ref 6–20)
CO2: 22 mmol/L (ref 22–32)
Calcium: 7.4 mg/dL — ABNORMAL LOW (ref 8.9–10.3)
Chloride: 102 mmol/L (ref 98–111)
Creatinine, Ser: 0.66 mg/dL (ref 0.44–1.00)
GFR calc Af Amer: >60 mL/min (ref >60)
GFR calc non Af Amer: >60 mL/min (ref >60)
Glucose, Bld: 91 mg/dL (ref 70–99)
Potassium: 4.1 mmol/L (ref 3.5–5.1)
Sodium: 132 mmol/L — ABNORMAL LOW (ref 135–145)

## 2018-08-31 LAB — MAGNESIUM: Magnesium: 1.7 mg/dL (ref 1.7–2.4)

## 2018-08-31 LAB — GLUCOSE, CAPILLARY
Glucose-Capillary: 104 mg/dL — ABNORMAL HIGH (ref 70–99)
Glucose-Capillary: 131 mg/dL — ABNORMAL HIGH (ref 70–99)
Glucose-Capillary: 74 mg/dL (ref 70–99)
Glucose-Capillary: 76 mg/dL (ref 70–99)
Glucose-Capillary: 82 mg/dL (ref 70–99)
Glucose-Capillary: 85 mg/dL (ref 70–99)

## 2018-08-31 LAB — PHOSPHORUS: Phosphorus: 3 mg/dL (ref 2.5–4.6)

## 2018-08-31 NOTE — Progress Notes (Signed)
PROGRESS NOTE    Kathy Lawson  EBR:[id number] DOB: Dec 02, 1960 DOA: 08/28/2018 PCP: Damita Dunnings, NP (Inactive)   Brief Narrative: Patient is a 58 year old female with history of COPD not on home oxygen, systolic CHF, Crohn's disease, recent pneumonia requiring intubation, hypertension, diabetes who was sent from [facility] for the management of severe C. difficile colitis.    In February, she was admitted to [facility] for pneumonia for which she required intubation.  She was discharged to skilled nursing facility.  In skilled nursing facility, she developed C. difficile and was started on oral vancomycin and was discharged home.  She took vancomycin for 7 days but could not continue more than that.  Her diarrhea worsened and she presented to [facility] again. CT imaging done here showed colitis.  Started on vancomycin and Flagyl.  General surgery following.Diarrhoea is improving.  Assessment & Plan:   Principal Problem:   C. 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 difficile colitis: Presented with severe diarrhea.  CT scan showed colitis done at [facility].  No evidence of ileus, colon dilatation as per abdominal imaging done here. Presented with leukocytosis which has resolved. Currently on vancomycin and Flagyl since 08/28/2018.  Plan for total of 14 days course for vancomycin.  She can be discharged only on oral vancomycin when she is ready.Continue IV Flagyl until discharge. General surgery following.  Diarrhea has slowed down.  Still complains of some abdominal discomfort and bloating.  Tolerating full liquid diet.  History of systolic CHF: No echo report in our system.  History of systolic CHF with ejection fraction of 45% as per reports.  She has severe bilateral lower extremity swelling ,most likely from the IV  fluids.  IV fluids will be discontinued.  When diarrhea resolves, she needs to be on lasix  on discharge.  She follows with cardiology at [facility].  Continue metoprolol.  On lisinopril hydrochlorothiazide at home.  Hypertension: Currently normotensive.  Home medications on hold.  History of coronary artery disease: Currently stable.  No report of chest pain.  Continue aspirin, Lipitor and metoprolol.  History of Crohn's disease: Followed by Dr. Melina Copa at [facility].  No evidence of flareup.  COPD: Currently stable.  Past smoker.  Continue home medications and PRN nebulizers.  Has history of acute respiratory failure due to pneumonia and was intubated in February.  Hyponatremia: Mild .  Monitor.  Debility/deconditioning: Patient has generalized weakness.  Will request for PT/OT evaluation.  Might need rehab.           DVT prophylaxis:Lovenox Code Status: Full Family Communication: None present at the bedside Disposition Plan: Home versus skilled nursing facility after PT/OT evaluation.  Needs general surgery clearance before discharge.   Consultants: General surgery  Procedures: None  Antimicrobials:  Anti-infectives (From admission, onward)   Start     Dose/Rate Route Frequency Ordered Stop   08/28/18 2200  vancomycin (VANCOCIN) 50 mg/mL oral solution 500 mg     500 mg Oral Every 6 hours 08/28/18 2057 09/11/18 2359   08/28/18 2200  metroNIDAZOLE (FLAGYL) IVPB 500 mg     500 mg 100 mL/hr over 60 Minutes Intravenous Every 8 hours 08/28/18 2057 09/11/18 2159   08/28/18 2200  cefTRIAXone (ROCEPHIN) 2 g in sodium chloride 0.9 % 100 mL IVPB  Status:  Discontinued  2 g 200 mL/hr over 30 Minutes Intravenous Every 24 hours 08/28/18 2110 08/30/18 1138      Subjective: Patient seen and examined the bedside this morning.  Hemodynamically stable.  Diarrhea has slowed down.  Still complains of abdominal discomfort and bloating.  No nausea or vomiting.  Objective: Vitals:    08/30/18 2302 08/31/18 0021 08/31/18 0442 08/31/18 0746  BP: 123/76  134/66   Pulse: 86  83   Resp:      Temp:  98 F (36.7 C) 98.2 F (36.8 C)   TempSrc:  Oral Oral   SpO2: 100%  98% 98%  Weight:      Height:        Intake/Output Summary (Last 24 hours) at 08/31/2018 1235 Last data filed at 08/31/2018 0400 Gross per 24 hour  Intake 640.61 ml  Output 400 ml  Net 240.61 ml   Filed Weights   08/28/18 1624  Weight: 72.4 kg    Examination:  General exam:Not in distress, generalized weakness  HEENT:PERRL,Oral mucosa moist, Ear/Nose normal on gross exam Respiratory system: Decreased air entry on the bases, no wheezes or crackles heard Cardiovascular system: S1 & S2 heard, RRR. No JVD, murmurs, rubs, gallops or clicks.  2-3+ peripheral edema on the lower extremities Gastrointestinal system: Abdomen is slightly distended, soft and nontender. No organomegaly or masses felt. Normal bowel sounds heard. Central nervous system: Alert and oriented. No focal neurological deficits. Extremities: 2-3 + pedal  edema, no clubbing ,no cyanosis, distal peripheral pulses palpable. Skin: No rashes, lesions or ulcers,no icterus ,no pallor MSK: Normal muscle bulk,tone ,power Psychiatry: Judgement and insight appear normal. Mood & affect appropriate.     Data Reviewed: I have personally reviewed following labs and imaging studies  CBC: Recent Labs  Lab 08/28/18 2133 08/30/18 0315 08/31/18 0404  WBC 16.7* 10.0 7.1  HGB 9.8* 9.0* 9.0*  HCT 30.5* 27.4* 28.7*  MCV 99.7 100.4* 100.7*  PLT 332 326 706   Basic Metabolic Panel: Recent Labs  Lab 08/28/18 2133 08/30/18 0315 08/31/18 0404  NA 129* 133* 132*  K 3.4* 4.3 4.1  CL 96* 103 102  CO2 21* 20* 22  GLUCOSE 110* 67* 91  BUN 5* <5* <5*  CREATININE 0.70 0.70 0.66  CALCIUM 7.8* 7.4* 7.4*  MG 1.5* 2.1 1.7  PHOS 3.7 2.9 3.0   GFR: Estimated Creatinine Clearance: 69.7 mL/min (by C-G formula based on SCr of 0.66 mg/dL). Liver  Function Tests: Recent Labs  Lab 08/28/18 2133  AST 9*  ALT 14  ALKPHOS 86  BILITOT 0.6  PROT 4.6*  ALBUMIN 1.8*   No results for input(s): LIPASE, AMYLASE in the last 168 hours. No results for input(s): AMMONIA in the last 168 hours. Coagulation Profile: No results for input(s): INR, PROTIME in the last 168 hours. Cardiac Enzymes: No results for input(s): CKTOTAL, CKMB, CKMBINDEX, TROPONINI in the last 168 hours. BNP (last 3 results) No results for input(s): PROBNP in the last 8760 hours. HbA1C: No results for input(s): HGBA1C in the last 72 hours. CBG: Recent Labs  Lab 08/30/18 2242 08/31/18 0030 08/31/18 0437 08/31/18 0741 08/31/18 1150  GLUCAP 107* 82 85 74 104*   Lipid Profile: No results for input(s): CHOL, HDL, LDLCALC, TRIG, CHOLHDL, LDLDIRECT in the last 72 hours. Thyroid Function Tests: No results for input(s): TSH, T4TOTAL, FREET4, T3FREE, THYROIDAB in the last 72 hours. Anemia Panel: No results for input(s): VITAMINB12, FOLATE, FERRITIN, TIBC, IRON, RETICCTPCT in the last 72 hours. Sepsis Labs: Recent  Labs  Lab 08/28/18 2135  LATICACIDVEN 1.3    No results found for this or any previous visit (from the past 240 hour(s)).       Radiology Studies: No results found.      Scheduled Meds: . aspirin  81 mg Oral Daily  . atorvastatin  10 mg Oral q1800  . chlorhexidine  15 mL Mouth Rinse BID  . enoxaparin (LOVENOX) injection  40 mg Subcutaneous Q24H  . famotidine  40 mg Oral Daily  . insulin aspart  0-9 Units Subcutaneous Q4H  . mouth rinse  15 mL Mouth Rinse q12n4p  . metoprolol tartrate  12.5 mg Oral BID  . mirtazapine  30 mg Oral QHS  . mometasone-formoterol  2 puff Inhalation BID  . montelukast  10 mg Oral QHS  . vancomycin  500 mg Oral Q6H   Continuous Infusions: . metronidazole 500 mg (08/31/18 0551)     LOS: 3 days    Time spent: 35 mins.More than 50% of that time was spent in counseling and/or coordination of care.       Shelly Coss, MD [facility] Pager [phone]  If 7PM-7AM, please contact night-coverage [email or web address] Password [facility] 08/31/2018, 12:35 PM

## 2018-08-31 NOTE — Progress Notes (Signed)
[facility]/Trauma Progress Note      Assessment/Plan 58yo chronically ill woman with c diff colitis. - Con't abx - no plans for surgery at this time as patient is improving - mobilize - we will follow  FEN: FLD VTE: SCD's, lovenox ID: PO Vanc, IV Flagyl 04/11-04/25 Follow up: TBD    LOS: 3 days    Subjective: CC: abdominal pain  Pt states pain about the same as yesterday. She states bloating when she eats. She had creamy potato soup yesterday and as long as she eats slow the bloating is not too bad. Continued nausea but no vomiting. BM's have decreased.   Objective: Vital signs in last 24 hours: Temp:  [98 F (36.7 C)-99.3 F (37.4 C)] 98.2 F (36.8 C) (04/14 0442) Pulse Rate:  [83-97] 83 (04/14 0442) Resp:  [16-18] 18 (04/13 2024) BP: (110-134)/(53-80) 134/66 (04/14 0442) SpO2:  [98 %-100 %] 98 % (04/14 0746) Last BM Date: 08/31/18(rectal pouch)  Intake/Output from previous day: 04/13 0701 - 04/14 0700 In: 640.6 [P.O.:400; I.V.:240.6] Out: 400 [Urine:400] Intake/Output this shift: No intake/output data recorded.  PE: Gen:  Alert, NAD, pleasant, cooperative Pulm:  Rate and effort normal Abd: Soft, obese, ND, generalized TTP with mild guarding, no peritonitis  Skin: warm and dry   Anti-infectives: Anti-infectives (From admission, onward)   Start     Dose/Rate Route Frequency Ordered Stop   08/28/18 2200  vancomycin (VANCOCIN) 50 mg/mL oral solution 500 mg     500 mg Oral Every 6 hours 08/28/18 2057 09/11/18 2359   08/28/18 2200  metroNIDAZOLE (FLAGYL) IVPB 500 mg     500 mg 100 mL/hr over 60 Minutes Intravenous Every 8 hours 08/28/18 2057 09/11/18 2159   08/28/18 2200  cefTRIAXone (ROCEPHIN) 2 g in sodium chloride 0.9 % 100 mL IVPB  Status:  Discontinued     2 g 200 mL/hr over 30 Minutes Intravenous Every 24 hours 08/28/18 2110 08/30/18 1138      Lab Results:  Recent Labs    08/30/18 0315 08/31/18 0404  WBC 10.0 7.1  HGB 9.0* 9.0*   HCT 27.4* 28.7*  PLT 326 310   BMET Recent Labs    08/30/18 0315 08/31/18 0404  NA 133* 132*  K 4.3 4.1  CL 103 102  CO2 20* 22  GLUCOSE 67* 91  BUN <5* <5*  CREATININE 0.70 0.66  CALCIUM 7.4* 7.4*   PT/INR No results for input(s): LABPROT, INR in the last 72 hours. CMP     Component Value Date/Time   NA 132 (L) 08/31/2018 0404   K 4.1 08/31/2018 0404   CL 102 08/31/2018 0404   CO2 22 08/31/2018 0404   GLUCOSE 91 08/31/2018 0404   BUN <5 (L) 08/31/2018 0404   CREATININE 0.66 08/31/2018 0404   CALCIUM 7.4 (L) 08/31/2018 0404   PROT 4.6 (L) 08/28/2018 2133   ALBUMIN 1.8 (L) 08/28/2018 2133   AST 9 (L) 08/28/2018 2133   ALT 14 08/28/2018 2133   ALKPHOS 86 08/28/2018 2133   BILITOT 0.6 08/28/2018 2133   GFRNONAA >60 08/31/2018 0404   GFRAA >60 08/31/2018 0404   Lipase  No results found for: LIPASE  Studies/Results: No results found.    Kalman Drape , [facility] 08/31/2018, 9:39 AM  Pager: [phone] Mon-Wed, Friday 7:00am-4:30pm Thurs 7am-11:30am  Consults: [phone]

## 2018-08-31 NOTE — Evaluation (Signed)
Physical Therapy Evaluation Patient Details Name: Kathy Lawson MRN: [id number] DOB: 06/21/1960 Today's Date: 08/31/2018   History of Present Illness  58 year old female with history of COPD not on home oxygen, systolic CHF, Crohn's disease, recent pneumonia requiring intubation, hypertension, diabetes who was sent from [facility] for the management of severe C. difficile colitis.    In February, she was admitted to [facility] for pneumonia for which she required intubation.  She was discharged to skilled nursing facility.  In skilled nursing facility, she developed C. difficile and was started on oral vancomycin and was discharged home.  She took vancomycin for 7 days but could not continue more than that.  Her diarrhea worsened and she presented to [place] again. Pt transferred to [facility] for further workup  Clinical Impression  Pt admitted with above diagnosis. Pt currently with functional limitations due to the deficits listed below (see PT Problem List). Pt fatigues very quickly with activity as well as being nauseous when moving. Required min A to rise from recliner. Was unable to ambulate further than 3' with RW due to nausea and abdominal discomfort. Agree with OT that pt would benefit from return to SNF for further rehab before home with family.   Pt will benefit from skilled PT to increase their independence and safety with mobility to allow discharge to the venue listed below.      Follow Up Recommendations SNF;Supervision/Assistance - 24 hour    Equipment Recommendations  None recommended by PT    Recommendations for Other Services       Precautions / Restrictions Precautions Precautions: Fall Precaution Comments: rectal pouch Restrictions Weight Bearing Restrictions: No      Mobility  Bed Mobility Overal bed mobility: Needs Assistance Bed Mobility: Sit to Supine     Supine to sit: Min assist Sit to supine: Min assist   General bed mobility comments: min  A for LE's into bed  Transfers Overall transfer level: Needs assistance Equipment used: 4-wheeled walker Transfers: Sit to/from Stand Sit to Stand: Min assist Stand pivot transfers: Min assist       General transfer comment: min A for fwd wt shift and stability from recliner to rollator.   Ambulation/Gait Ambulation/Gait assistance: Min assist Gait Distance (Feet): 3 Feet Assistive device: 4-wheeled walker Gait Pattern/deviations: Step-to pattern;Shuffle Gait velocity: decreased Gait velocity interpretation: <1.31 ft/sec, indicative of household ambulator General Gait Details: pt nauseous with mobility, min A to steady, pt moaning throughout and very fatigued with all mobility  Stairs            Wheelchair Mobility    Modified Rankin (Stroke Patients Only)       Balance Overall balance assessment: Needs assistance Sitting-balance support: Feet supported Sitting balance-Leahy Scale: Good     Standing balance support: Bilateral upper extremity supported Standing balance-Leahy Scale: Poor Standing balance comment: reliant on UE support/external assist                             Pertinent Vitals/Pain Pain Assessment: Faces Faces Pain Scale: Hurts little more Pain Location: abdomen Pain Descriptors / Indicators: Discomfort;Sore Pain Intervention(s): Monitored during session;Repositioned    Home Living Family/patient expects to be discharged to:: Private residence Living Arrangements: Children Available Help at Discharge: Family Type of Home: House Home Access: Stairs to enter Entrance Stairs-Rails: Psychiatric nurse of Steps: 3, and then 2 more Home Layout: Two level;Able to live on main level with bedroom/bathroom Home Equipment:  Walker - 2 wheels;Bedside commode;Shower seat;Wheelchair - Biomedical engineer Comments: pt lives with son and daughter in law    Prior Function Level of Independence: Needs assistance    Gait / Transfers Assistance Needed: reports using RW for mobility, use of wheelchair for longer distances  ADL's / Homemaking Assistance Needed: daughter-in-law providing supervision for ADL And assist to wash hair  Comments:       Hand Dominance        Extremity/Trunk Assessment   Upper Extremity Assessment Upper Extremity Assessment: Defer to OT evaluation    Lower Extremity Assessment Lower Extremity Assessment: Generalized weakness    Cervical / Trunk Assessment Cervical / Trunk Assessment: Kyphotic  Communication   Communication: No difficulties  Cognition Arousal/Alertness: Awake/alert Behavior During Therapy: WFL for tasks assessed/performed Overall Cognitive Status: Within Functional Limits for tasks assessed                                        General Comments General comments (skin integrity, edema, etc.): pt uncomfortable with rectal tube    Exercises Other Exercises Other Exercises: encouraged ankle pumps throughout the day as pt with increased edema in bil feet   Assessment/Plan    PT Assessment Patient needs continued PT services  PT Problem List Decreased strength;Decreased activity tolerance;Decreased balance;Decreased mobility;Decreased knowledge of precautions;Pain       PT Treatment Interventions DME instruction;Gait training;Functional mobility training;Therapeutic activities;Therapeutic exercise;Balance training;Neuromuscular re-education;Patient/family education    PT Goals (Current goals can be found in the Care Plan section)  Acute Rehab PT Goals Patient Stated Goal: regain her strength PT Goal Formulation: With patient Time For Goal Achievement: 09/14/18 Potential to Achieve Goals: Good    Frequency Min 3X/week   Barriers to discharge        Co-evaluation               AM-PAC PT "6 Clicks" Mobility  Outcome Measure Help needed turning from your back to your side while in a flat bed without using  bedrails?: A Little Help needed moving from lying on your back to sitting on the side of a flat bed without using bedrails?: A Little Help needed moving to and from a bed to a chair (including a wheelchair)?: A Little Help needed standing up from a chair using your arms (e.g., wheelchair or bedside chair)?: A Little Help needed to walk in hospital room?: Total Help needed climbing 3-5 steps with a railing? : Total 6 Click Score: 14    End of Session Equipment Utilized During Treatment: Gait belt Activity Tolerance: Patient limited by fatigue(nausea) Patient left: in bed;with bed alarm set;with call bell/phone within reach Nurse Communication: Mobility status;Other (comment)(pt requesting nausea meds) PT Visit Diagnosis: Unsteadiness on feet (R26.81);Muscle weakness (generalized) (M62.81);Difficulty in walking, not elsewhere classified (R26.2);Pain Pain - part of body: (abdomen)    Time: 3276-1470 PT Time Calculation (min) (ACUTE ONLY): 17 min   Charges:   PT Evaluation $PT Eval Moderate Complexity: [place]  Pager [phone] Office [place] 08/31/2018, 4:51 PM

## 2018-08-31 NOTE — Evaluation (Signed)
Occupational Therapy Evaluation Patient Details Name: Kathy Lawson MRN: [id number] DOB: [birth year] Today's Date: 08/31/2018    History of Present Illness 58 year old female with history of COPD not on home oxygen, systolic CHF, Crohn's disease, recent pneumonia requiring intubation, hypertension, diabetes who was sent from [facility] for the management of severe C. difficile colitis.    In February, she was admitted to [facility] for pneumonia for which she required intubation.  She was discharged to skilled nursing facility.  In skilled nursing facility, she developed C. difficile and was started on oral vancomycin and was discharged home.  She took vancomycin for 7 days but could not continue more than that.  Her diarrhea worsened and she presented to [place] again. Pt transferred to [facility] for further workup   Clinical Impression   This 58 y/o female presents with the above. Pt reports after her most recent SNF stay and return home she was using RW for functional mobility, (using wheelchair for longer distances); and receiving intermittent assist from her daughter-in-law for ADL completion. Pt presents with decreased activity tolerance and generalized weakness. She requires min-light modA for stand pivot transfers (use of HHA), minguard assist for seated UB ADL and maxA for LB ADL. Pt will benefit from continued acute OT services and recommend pt return to SNF for additional ST rehab services prior to return home to maximize her strength, safety and independence with ADL and mobility. Will follow.     Follow Up Recommendations  SNF;Supervision/Assistance - 24 hour    Equipment Recommendations  None recommended by OT           Precautions / Restrictions Precautions Precautions: Fall Precaution Comments: rectal pouch Restrictions Weight Bearing Restrictions: No      Mobility Bed Mobility Overal bed mobility: Needs Assistance Bed Mobility: Supine to Sit     Supine  to sit: Min assist     General bed mobility comments: increased time and effort, minA to elevate trunk and to guide bed pad while pt scoots hips towards EOB to decrease sheer on rectal pouch  Transfers Overall transfer level: Needs assistance Equipment used: 1 person hand held assist Transfers: Sit to/from [facility] Sit to Stand: Mod assist Stand pivot transfers: Min assist       General transfer comment: boosting assist to rise and steady in standing; use of face to face method and pt with UE support on therapist elbow while taking pivotal steps to recliner, pt able to reach with other UE towards arm of recliner during transition    Balance Overall balance assessment: Needs assistance Sitting-balance support: Feet supported Sitting balance-Leahy Scale: Good     Standing balance support: Bilateral upper extremity supported Standing balance-Leahy Scale: Poor Standing balance comment: reliant on UE support/external assist                           ADL either performed or assessed with clinical judgement   ADL Overall ADL's : Needs assistance/impaired Eating/Feeding: Set up;Sitting   Grooming: Set up;Min guard;Sitting   Upper Body Bathing: Min guard;Sitting   Lower Body Bathing: Moderate assistance;Sit to/from stand   Upper Body Dressing : Min guard;Sitting;Set up   Lower Body Dressing: Maximal assistance;Sit to/from stand Lower Body Dressing Details (indicate cue type and reason): minA standing balance; pt unable to don socks today seated EOB Toilet Transfer: Minimal assistance;Stand-pivot Toilet Transfer Details (indicate cue type and reason): simulated via transfer to [place] and  Hygiene: Moderate assistance;Sit to/from stand Toileting - Clothing Manipulation Details (indicate cue type and reason): pt currently with rectal pouch      Functional mobility during ADLs: Minimal assistance;Moderate  assistance(HHA) General ADL Comments: pt with generalized weakness and fatigue with activity     Vision         Perception     Praxis      Pertinent Vitals/Pain Pain Assessment: Faces Faces Pain Scale: Hurts little more Pain Location: abdomen Pain Descriptors / Indicators: Discomfort;Sore Pain Intervention(s): Monitored during session;Repositioned;Limited activity within patient's tolerance     Hand Dominance     Extremity/Trunk Assessment Upper Extremity Assessment Upper Extremity Assessment: Generalized weakness   Lower Extremity Assessment Lower Extremity Assessment: Defer to PT evaluation       Communication Communication Communication: No difficulties   Cognition Arousal/Alertness: Awake/alert Behavior During Therapy: WFL for tasks assessed/performed Overall Cognitive Status: Within Functional Limits for tasks assessed                                     General Comments  spoke with pt regarding returning to rehab for additional therapy services prior to return home, pt in agreement    Exercises Exercises: Other exercises Other Exercises Other Exercises: encouraged ankle pumps throughout the day as pt with increased edema in bil feet   Shoulder Instructions      Home Living Family/patient expects to be discharged to:: Private residence Living Arrangements: Children(son and daughter-in-law) Available Help at Discharge: Family Type of Home: House Home Access: Stairs to enter [employer] of Steps: 3, and then 2 more Entrance Stairs-Rails: Right;Left Home Layout: Two level;Able to live on main level with bedroom/bathroom     Bathroom Shower/Tub: Teacher, early years/pre: Standard     Home Equipment: Environmental consultant - 2 wheels;Bedside commode;Shower seat;Wheelchair - manual          Prior Functioning/Environment Level of Independence: Needs assistance  Gait / Transfers Assistance Needed: reports using RW for mobility,  use of wheelchair for longer distances ADL's / Homemaking Assistance Needed: daughter-in-law providing supervision for ADL And assist to wash hair   Comments:          OT Problem List: Decreased strength;Decreased range of motion;Decreased activity tolerance;Impaired balance (sitting and/or standing);Impaired UE functional use      OT Treatment/Interventions: Self-care/ADL training;Therapeutic exercise;Neuromuscular education;DME and/or AE instruction;Therapeutic activities;Patient/family education;Balance training;Energy conservation    OT Goals(Current goals can be found in the care plan section) Acute Rehab OT Goals Patient Stated Goal: regain her strength OT Goal Formulation: With patient Time For Goal Achievement: 09/14/18 Potential to Achieve Goals: Good  OT Frequency: Min 2X/week   Barriers to D/C:            Co-evaluation              AM-PAC OT "6 Clicks" Daily Activity     Outcome Measure Help from another person eating meals?: None Help from another person taking care of personal grooming?: A Little Help from another person toileting, which includes using toliet, bedpan, or urinal?: A Lot Help from another person bathing (including washing, rinsing, drying)?: A Lot Help from another person to put on and taking off regular upper body clothing?: A Little Help from another person to put on and taking off regular lower body clothing?: A Lot 6 Click Score: 16   End of Session Nurse Communication: Mobility status  Activity Tolerance: Patient tolerated treatment well Patient left: in chair;with call bell/phone within reach;with chair alarm set  OT Visit Diagnosis: Unsteadiness on feet (R26.81);Muscle weakness (generalized) (M62.81)                Time: 9872-1587 OT Time Calculation (min): 23 min Charges:  OT General Charges $OT Visit: 1 Visit OT Evaluation $OT Eval Moderate Complexity: [place], OT [employer] Pager  [phone] Office [phone]   Raymondo Band 08/31/2018, 3:39 PM

## 2018-09-01 DIAGNOSIS — E871 Hypo-osmolality and hyponatremia: Secondary | ICD-10-CM

## 2018-09-01 LAB — CBC
HCT: 31.2 % — ABNORMAL LOW (ref 36.0–46.0)
Hemoglobin: 10 g/dL — ABNORMAL LOW (ref 12.0–15.0)
MCH: 31.9 pg (ref 26.0–34.0)
MCHC: 32.1 g/dL (ref 30.0–36.0)
MCV: 99.7 fL (ref 80.0–100.0)
Platelets: 335 10*3/uL (ref 150–400)
RBC: 3.13 MIL/uL — ABNORMAL LOW (ref 3.87–5.11)
RDW: 13.9 % (ref 11.5–15.5)
WBC: 7.3 10*3/uL (ref 4.0–10.5)
nRBC: 0 % (ref 0.0–0.2)

## 2018-09-01 LAB — BASIC METABOLIC PANEL
Anion gap: 9 (ref 5–15)
BUN: <5 mg/dL — ABNORMAL LOW (ref 6–20)
CO2: 21 mmol/L — ABNORMAL LOW (ref 22–32)
Calcium: 7.4 mg/dL — ABNORMAL LOW (ref 8.9–10.3)
Chloride: 100 mmol/L (ref 98–111)
Creatinine, Ser: 0.62 mg/dL (ref 0.44–1.00)
GFR calc Af Amer: >60 mL/min (ref >60)
GFR calc non Af Amer: >60 mL/min (ref >60)
Glucose, Bld: 101 mg/dL — ABNORMAL HIGH (ref 70–99)
Potassium: 3.6 mmol/L (ref 3.5–5.1)
Sodium: 130 mmol/L — ABNORMAL LOW (ref 135–145)

## 2018-09-01 LAB — GLUCOSE, CAPILLARY
Glucose-Capillary: 110 mg/dL — ABNORMAL HIGH (ref 70–99)
Glucose-Capillary: 119 mg/dL — ABNORMAL HIGH (ref 70–99)
Glucose-Capillary: 141 mg/dL — ABNORMAL HIGH (ref 70–99)
Glucose-Capillary: 76 mg/dL (ref 70–99)
Glucose-Capillary: 84 mg/dL (ref 70–99)
Glucose-Capillary: 85 mg/dL (ref 70–99)
Glucose-Capillary: 96 mg/dL (ref 70–99)

## 2018-09-01 LAB — PHOSPHORUS: Phosphorus: 2.9 mg/dL (ref 2.5–4.6)

## 2018-09-01 LAB — MAGNESIUM: Magnesium: 1.7 mg/dL (ref 1.7–2.4)

## 2018-09-01 MED ORDER — POTASSIUM CHLORIDE CRYS ER 20 MEQ PO TBCR
40.0000 meq | EXTENDED_RELEASE_TABLET | Freq: Once | ORAL | Status: AC
  Administered 2018-09-01: 40 meq via ORAL
  Filled 2018-09-01: qty 2

## 2018-09-01 NOTE — NC FL2 (Signed)
[facility] LEVEL OF CARE SCREENING TOOL     IDENTIFICATION  Patient Name: Kathy Lawson Birthdate: Sep 05, 1960 Sex: female Admission Date (Current Location): 08/28/2018  [facility] and [place] Number:  [employer] and Address:  [facility] [place]. [facility], [place] [address] [place], [place] [address]      Provider Number: [id number]  Attending Physician Name and Address:  Bonnielee Haff, MD  Relative Name and Phone Number:  macaria, bias   [phone] , Reamer,selena Other   [phone]     Current Level of Care: Hospital Recommended Level of Care: [place] Prior Approval Number:    Date Approved/Denied:   PASRR Number: [id number]  Discharge Plan: SNF    Current Diagnoses: Patient Active Problem List   Diagnosis Date Noted  . Hyponatremia 08/30/2018  . History of Crohn's colitis 08/29/2018  . C. difficile colitis 08/28/2018  . 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 Crohn's disease ([place]) 08/28/2018  . CAD in native artery 07/29/2018  . Chronic combined systolic and diastolic CHF (congestive heart failure) ([place]) 07/29/2018  . Chronic ulcer of left lower extremity with fat layer exposed ([place]) 12/24/2015  . Allergic rhinoconjunctivitis 02/07/2015  . Sinusitis, chronic 02/07/2015  . Headache 02/07/2015  . GERD (gastroesophageal reflux disease) 02/07/2015  . Caffeine use 02/07/2015  . Tobacco abuse 02/07/2015  . History of COPD 02/07/2015  . Tobacco use disorder 10/04/2013  . Obstructive chronic bronchitis with exacerbation ([place]) 10/04/2013  . Hypertension   . Hyperlipemia   . Emphysema/COPD Gold C     [place] RESPIRATION BLADDER Height & Weight     Self, Time, Situation, Place  Normal Continent Weight: 74.2 kg Height:  5' 1"  (154.9 cm)  BEHAVIORAL SYMPTOMS/MOOD NEUROLOGICAL BOWEL NUTRITION STATUS      Incontinent (per order)  AMBULATORY STATUS COMMUNICATION OF NEEDS Skin   Extensive Assist Verbally  Skin abrasions, Bruising                       Personal Care Assistance Level of Assistance  Bathing Bathing Assistance: Limited assistance         Functional Limitations Info  Sight Sight Info: Impaired        SPECIAL CARE FACTORS FREQUENCY  PT (By licensed PT), OT (By licensed OT)(5 times a week)     PT Frequency: 5 times a week OT Frequency: 5 times a week            Contractures Contractures Info: Not present    Additional Factors Info  Code Status Code Status Info: full Allergies Info: lasix, meloxicam, Sulfonamide (substance), latex Psychotropic Info: remeron Insulin Sliding Scale Info: see orders       Current Medications (09/01/2018):  This is the current hospital active medication list Current Facility-Administered Medications  Medication Dose Route Frequency Provider Last Rate Last Dose  . acetaminophen (TYLENOL) tablet 650 mg  650 mg Oral Q6H PRN Wendee Beavers T, MD   650 mg at 08/30/18 1743  . aspirin chewable tablet 81 mg  81 mg Oral Daily Shela Leff, MD   81 mg at 09/01/18 0903  . atorvastatin (LIPITOR) tablet 10 mg  10 mg Oral q1800 Shela Leff, MD   10 mg at 08/31/18 1828  . chlorhexidine (PERIDEX) 0.12 % solution 15 mL  15 mL Mouth Rinse BID Thurnell Lose, MD   15 mL at 09/01/18 0903  . enoxaparin (LOVENOX) injection 40 mg  40 mg Subcutaneous Q24H Shela Leff, MD  40 mg at 09/01/18 0013  . famotidine (PEPCID) tablet 40 mg  40 mg Oral Daily Thurnell Lose, MD   40 mg at 09/01/18 0903  . HYDROcodone-acetaminophen (NORCO/VICODIN) 5-325 MG per tablet 1 tablet  1 tablet Oral Q12H PRN Thurnell Lose, MD   1 tablet at 08/31/18 0023  . insulin aspart (novoLOG) injection 0-9 Units  0-9 Units Subcutaneous Q4H Shela Leff, MD   1 Units at 09/01/18 1202  . ipratropium-albuterol (DUONEB) 0.5-2.5 (3) MG/3ML nebulizer solution 3 mL  3 mL Nebulization Q6H PRN Shela Leff, MD      . MEDLINE mouth rinse  15 mL Mouth  Rinse q12n4p Thurnell Lose, MD   15 mL at 09/01/18 1203  . metoprolol tartrate (LOPRESSOR) tablet 12.5 mg  12.5 mg Oral BID Wendee Beavers T, MD   12.5 mg at 09/01/18 0903  . metroNIDAZOLE (FLAGYL) IVPB 500 mg  500 mg Intravenous Q8H Shela Leff, MD 100 mL/hr at 09/01/18 1410 500 mg at 09/01/18 1410  . mirtazapine (REMERON) tablet 30 mg  30 mg Oral QHS Shela Leff, MD   30 mg at 09/01/18 0013  . mometasone-formoterol (DULERA) 200-5 MCG/ACT inhaler 2 puff  2 puff Inhalation BID Shela Leff, MD   2 puff at 09/01/18 0740  . montelukast (SINGULAIR) tablet 10 mg  10 mg Oral QHS Shela Leff, MD   10 mg at 09/01/18 0013  . ondansetron (ZOFRAN) injection 4 mg  4 mg Intravenous Q6H PRN Shela Leff, MD   4 mg at 09/01/18 1210  . traZODone (DESYREL) tablet 50 mg  50 mg Oral QHS PRN Shela Leff, MD      . vancomycin (VANCOCIN) 50 mg/mL oral solution 500 mg  500 mg Oral Q6H Shela Leff, MD   500 mg at 09/01/18 1202     Discharge Medications: Please see discharge summary for a list of discharge medications.  Relevant Imaging Results:  Relevant Lab Results:   Additional Information [id number]  Carles Collet, RN

## 2018-09-01 NOTE — Progress Notes (Signed)
Physical Therapy Treatment Patient Details Name: Kathy Lawson MRN: [id number] DOB: 08-01-1960 Today's Date: 09/01/2018    History of Present Illness Pt is a 58 year old female with history of COPD not on home oxygen, systolic CHF, Crohn's disease, recent pneumonia requiring intubation, hypertension, diabetes who was sent from [facility] for the management of severe C. difficile colitis.    In February, she was admitted to [facility] for pneumonia for which she required intubation.  She was discharged to skilled nursing facility.  In skilled nursing facility, she developed C. difficile and was started on oral vancomycin and was discharged home.  She took vancomycin for 7 days but could not continue more than that.  Her diarrhea worsened and she presented to [place] again. Pt transferred to [facility] for further workup    PT Comments    Pt making fair progress with functional mobility. She remains limited secondary to fatigue and weakness. Pt continues to require min A for transfers. Pt would greatly benefit from short term rehab at a SNF to maximize her independence with functional mobility prior to returning home with family. Pt would continue to benefit from skilled physical therapy services at this time while admitted and after d/c to address the below listed limitations in order to improve overall safety and independence with functional mobility.    Follow Up Recommendations  SNF;Supervision/Assistance - 24 hour     Equipment Recommendations  None recommended by PT    Recommendations for Other Services       Precautions / Restrictions Precautions Precautions: Fall Precaution Comments: rectal pouch Restrictions Weight Bearing Restrictions: No    Mobility  Bed Mobility Overal bed mobility: Needs Assistance Bed Mobility: Supine to Sit     Supine to sit: Min guard     General bed mobility comments: min guard with use of railing, increased time and effort  needed  Transfers Overall transfer level: Needs assistance Equipment used: None Transfers: Sit to/from [employer] Transfers Sit to Stand: From elevated surface;Min assist Stand pivot transfers: Min assist       General transfer comment: min A to power into standing and for stability with transitional movement  Ambulation/Gait                 Stairs             Wheelchair Mobility    Modified Rankin (Stroke Patients Only)       Balance Overall balance assessment: Needs assistance Sitting-balance support: Feet supported Sitting balance-Leahy Scale: Good     Standing balance support: Bilateral upper extremity supported Standing balance-Leahy Scale: Poor                              Cognition Arousal/Alertness: Awake/alert Behavior During Therapy: WFL for tasks assessed/performed Overall Cognitive Status: Within Functional Limits for tasks assessed                                        Exercises      General Comments        Pertinent Vitals/Pain Pain Assessment: Faces Faces Pain Scale: Hurts little more Pain Location: abdomen Pain Descriptors / Indicators: Guarding Pain Intervention(s): Monitored during session    Home Living                      Prior Function  PT Goals (current goals can now be found in the care plan section) Acute Rehab PT Goals PT Goal Formulation: With patient Time For Goal Achievement: 09/14/18 Potential to Achieve Goals: Good Progress towards PT goals: Progressing toward goals    Frequency           PT Plan Current plan remains appropriate    Co-evaluation              AM-PAC PT "6 Clicks" Mobility   Outcome Measure  Help needed turning from your back to your side while in a flat bed without using bedrails?: A Little Help needed moving from lying on your back to sitting on the side of a flat bed without using bedrails?: A Little Help  needed moving to and from a bed to a chair (including a wheelchair)?: A Little Help needed standing up from a chair using your arms (e.g., wheelchair or bedside chair)?: A Little Help needed to walk in hospital room?: Total Help needed climbing 3-5 steps with a railing? : Total 6 Click Score: 14    End of Session   Activity Tolerance: Patient limited by fatigue Patient left: in chair;with call bell/phone within reach;with chair alarm set Nurse Communication: Mobility status PT Visit Diagnosis: Other abnormalities of gait and mobility (R26.89);Muscle weakness (generalized) (M62.81)     Time: 7846-9629 PT Time Calculation (min) (ACUTE ONLY): 16 min  Charges:  $Therapeutic Activity: 8-22 mins                     Sherie Don, PT, DPT  Acute Rehabilitation Services Pager [phone] Office [place] 09/01/2018, 12:19 PM

## 2018-09-01 NOTE — Progress Notes (Addendum)
PROGRESS NOTE    Kathy Lawson  OMB:[id number] DOB: 11-18-1960 DOA: 08/28/2018 PCP: Damita Dunnings, NP (Inactive)   Brief Narrative: Patient is a 58 year old female with history of COPD not on home oxygen, systolic CHF, Crohn's disease, recent pneumonia requiring intubation, hypertension, diabetes who was sent from [facility] for the management of severe C. difficile colitis.    In February, she was admitted to [facility] for pneumonia for which she required intubation.  She was discharged to skilled nursing facility.  In skilled nursing facility, she developed C. difficile and was started on oral vancomycin and was discharged home.  She took vancomycin for 7 days but could not continue more than that.  Her diarrhea worsened and she presented to [place] again.  CT imaging done here showed colitis.  Started on vancomycin and Flagyl.  General surgery was consulted.  Assessment & Plan:   Principal Problem:   C. 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 difficile colitis: Presented with severe diarrhea.  CT scan  done at [facility] showed colitis.  No evidence of ileus, colon dilatation as per abdominal imaging done here. Presented with leukocytosis which has resolved. Currently on vancomycin and Flagyl since 08/28/2018.  Plan for total of 14 days course for vancomycin.  Symptomatically she appears to be improving.  She feels that the diarrhea is slowing down.  She does have a rectal tube which can be removed if indeed diarrhea subsiding.  Start mobilizing.  Continue soft diet for now.  General surgery appears to have signed off.  History of systolic CHF: No echo report in our system.  History of systolic CHF with ejection fraction of 45% as per reports.  Patient was noted to have lower extremity edema.  IV fluids were  discontinued.  Patient follows with cardiology in [place].  Continue metoprolol.  She is also on lisinopril and HCTZ at home.  Continue to watch volume status.  Essential hypertension: Currently normotensive.  Home medications on hold.  History of coronary artery disease: Currently stable.  No report of chest pain.  Continue aspirin, Lipitor and metoprolol.  History of Crohn's disease: Followed by Dr. Melina Copa in [place].  No evidence of flareup.  COPD: Currently stable.  Past smoker.  Continue home medications and PRN nebulizers.  Has history of acute respiratory failure due to pneumonia and was intubated in February.  Hyponatremia: Mild .  Monitor.  Debility/deconditioning: Patient has generalized weakness.  Patient seen by PT and OT who recommends rehab at skilled nursing facility.    Normocytic anemia: Hemoglobin is stable.  No evidence of overt bleeding.   DVT prophylaxis:Lovenox Code Status: Full Family Communication: Discussed with the patient Disposition Plan: Plan is for discharge to skilled nursing facility when medically improved.  Hopefully in the next 48 hours.   Consultants: General surgery  Procedures: None  Antimicrobials:  Anti-infectives (From admission, onward)   Start     Dose/Rate Route Frequency Ordered Stop   08/28/18 2200  vancomycin (VANCOCIN) 50 mg/mL oral solution 500 mg     500 mg Oral Every 6 hours 08/28/18 2057 09/11/18 2359   08/28/18 2200  metroNIDAZOLE (FLAGYL) IVPB 500 mg     500 mg 100 mL/hr over 60 Minutes Intravenous Every 8 hours 08/28/18 2057 09/11/18 2159   08/28/18 2200  cefTRIAXone (ROCEPHIN) 2  g in sodium chloride 0.9 % 100 mL IVPB  Status:  Discontinued     2 g 200 mL/hr over 30 Minutes Intravenous Every 24 hours 08/28/18 2110 08/30/18 1138      Subjective: Patient states that she is feeling better.  Abdominal pain persists but improved.  No nausea or vomiting.  Diarrhea has improved.  Objective: Vitals:   09/01/18 0419  09/01/18 0426 09/01/18 0742 09/01/18 0903  BP:  134/70  128/80  Pulse:  83  100  Resp:  15    Temp:  98.4 F (36.9 C)    TempSrc:  Oral    SpO2:  97% 96%   Weight: 74.2 kg     Height:        Intake/Output Summary (Last 24 hours) at 09/01/2018 1052 Last data filed at 09/01/2018 1004 Gross per 24 hour  Intake 240 ml  Output 350 ml  Net -110 ml   Filed Weights   08/28/18 1624 09/01/18 0419  Weight: 72.4 kg 74.2 kg    Examination:  General appearance: Awake alert.  In no distress Resp: Clear to auscultation bilaterally.  Normal effort Cardio: S1-S2 is normal regular.  No S3-S4.  No rubs murmurs or bruit GI: Abdomen is soft.  Mildly tender in the right lower quadrant without any rebound rigidity or guarding.  Bowel sounds are present normal.  No masses organomegaly Extremities: No edema.  Full range of motion of lower extremities. Neurologic: Alert and oriented x3.  No focal neurological deficits.      Data Reviewed: I have personally reviewed following labs and imaging studies  CBC: Recent Labs  Lab 08/28/18 2133 08/30/18 0315 08/31/18 0404 09/01/18 0125  WBC 16.7* 10.0 7.1 7.3  HGB 9.8* 9.0* 9.0* 10.0*  HCT 30.5* 27.4* 28.7* 31.2*  MCV 99.7 100.4* 100.7* 99.7  PLT 332 326 310 332   Basic Metabolic Panel: Recent Labs  Lab 08/28/18 2133 08/30/18 0315 08/31/18 0404 09/01/18 0125  NA 129* 133* 132* 130*  K 3.4* 4.3 4.1 3.6  CL 96* 103 102 100  CO2 21* 20* 22 21*  GLUCOSE 110* 67* 91 101*  BUN 5* <5* <5* <5*  CREATININE 0.70 0.70 0.66 0.62  CALCIUM 7.8* 7.4* 7.4* 7.4*  MG 1.5* 2.1 1.7 1.7  PHOS 3.7 2.9 3.0 2.9   GFR: Estimated Creatinine Clearance: 70.7 mL/min (by C-G formula based on SCr of 0.62 mg/dL).  Liver Function Tests: Recent Labs  Lab 08/28/18 2133  AST 9*  ALT 14  ALKPHOS 86  BILITOT 0.6  PROT 4.6*  ALBUMIN 1.8*   CBG: Recent Labs  Lab 08/31/18 1656 08/31/18 2005 09/01/18 0006 09/01/18 0420 09/01/18 0743  GLUCAP 131* 76 110*  84 85   Sepsis Labs: Recent Labs  Lab 08/28/18 2135  LATICACIDVEN 1.3    Radiology Studies: No results found.   Scheduled Meds: . aspirin  81 mg Oral Daily  . atorvastatin  10 mg Oral q1800  . chlorhexidine  15 mL Mouth Rinse BID  . enoxaparin (LOVENOX) injection  40 mg Subcutaneous Q24H  . famotidine  40 mg Oral Daily  . insulin aspart  0-9 Units Subcutaneous Q4H  . mouth rinse  15 mL Mouth Rinse q12n4p  . metoprolol tartrate  12.5 mg Oral BID  . mirtazapine  30 mg Oral QHS  . mometasone-formoterol  2 puff Inhalation BID  . montelukast  10 mg Oral QHS  . vancomycin  500 mg Oral Q6H   Continuous Infusions: . metronidazole 500  mg (09/01/18 0617)     LOS: 4 days     Bonnielee Haff, MD [facility]  If 7PM-7AM, please contact night-coverage [email or web address] Password [facility] 09/01/2018, 10:52 AM

## 2018-09-02 LAB — CBC
HCT: 28.6 % — ABNORMAL LOW (ref 36.0–46.0)
Hemoglobin: 9.4 g/dL — ABNORMAL LOW (ref 12.0–15.0)
MCH: 32.4 pg (ref 26.0–34.0)
MCHC: 32.9 g/dL (ref 30.0–36.0)
MCV: 98.6 fL (ref 80.0–100.0)
Platelets: 335 10*3/uL (ref 150–400)
RBC: 2.9 MIL/uL — ABNORMAL LOW (ref 3.87–5.11)
RDW: 14.1 % (ref 11.5–15.5)
WBC: 8.8 10*3/uL (ref 4.0–10.5)
nRBC: 0 % (ref 0.0–0.2)

## 2018-09-02 LAB — GLUCOSE, CAPILLARY
Glucose-Capillary: 83 mg/dL (ref 70–99)
Glucose-Capillary: 82 mg/dL (ref 70–99)
Glucose-Capillary: 107 mg/dL — ABNORMAL HIGH (ref 70–99)
Glucose-Capillary: 89 mg/dL (ref 70–99)

## 2018-09-02 LAB — BASIC METABOLIC PANEL
Anion gap: 8 (ref 5–15)
BUN: <5 mg/dL — ABNORMAL LOW (ref 6–20)
CO2: 20 mmol/L — ABNORMAL LOW (ref 22–32)
Calcium: 7.2 mg/dL — ABNORMAL LOW (ref 8.9–10.3)
Chloride: 103 mmol/L (ref 98–111)
Creatinine, Ser: 0.65 mg/dL (ref 0.44–1.00)
GFR calc Af Amer: >60 mL/min (ref >60)
GFR calc non Af Amer: >60 mL/min (ref >60)
Glucose, Bld: 96 mg/dL (ref 70–99)
Potassium: 4 mmol/L (ref 3.5–5.1)
Sodium: 131 mmol/L — ABNORMAL LOW (ref 135–145)

## 2018-09-02 LAB — MAGNESIUM: Magnesium: 1.6 mg/dL — ABNORMAL LOW (ref 1.7–2.4)

## 2018-09-02 MED ORDER — MAGNESIUM SULFATE 2 GM/50ML IV SOLN
2.0000 g | Freq: Once | INTRAVENOUS | Status: AC
  Administered 2018-09-02: 2 g via INTRAVENOUS
  Filled 2018-09-02: qty 50

## 2018-09-02 NOTE — Progress Notes (Signed)
Physical Therapy Treatment Patient Details Name: Kathy Lawson MRN: [id number] DOB: 1961/01/02 Today's Date: 09/02/2018    History of Present Illness Pt is a 58 year old female with history of COPD not on home oxygen, systolic CHF, Crohn's disease, recent pneumonia requiring intubation, hypertension, diabetes who was sent from [facility] for the management of severe C. difficile colitis.    In February, she was admitted to [facility] for pneumonia for which she required intubation.  She was discharged to skilled nursing facility.  In skilled nursing facility, she developed C. difficile and was started on oral vancomycin and was discharged home.  She took vancomycin for 7 days but could not continue more than that.  Her diarrhea worsened and she presented to [place] again. Pt transferred to [facility] for further workup    PT Comments    Pt making steady progress and tolerated short distance ambulation within room with RW and min guard. She remains limited secondary to fatigue and weakness. Pt would continue to benefit from skilled physical therapy services at this time while admitted and after d/c to address the below listed limitations in order to improve overall safety and independence with functional mobility.    Follow Up Recommendations  SNF;Supervision/Assistance - 24 hour     Equipment Recommendations  None recommended by PT    Recommendations for Other Services       Precautions / Restrictions Precautions Precautions: Fall Precaution Comments: rectal pouch Restrictions Weight Bearing Restrictions: No    Mobility  Bed Mobility Overal bed mobility: Needs Assistance Bed Mobility: Supine to Sit     Supine to sit: Min guard     General bed mobility comments: min guard with use of railing, increased time and effort needed  Transfers Overall transfer level: Needs assistance Equipment used: Rolling walker (2 wheeled) Transfers: Sit to/from Stand Sit to Stand:  From elevated surface;Min guard         General transfer comment: cueing for safe hand placement, bed in elevated position, min guard for safety  Ambulation/Gait Ambulation/Gait assistance: Min guard Gait Distance (Feet): 10 Feet Assistive device: Rolling walker (2 wheeled) Gait Pattern/deviations: Step-to pattern;Step-through pattern;Decreased step length - right;Decreased step length - left;Decreased stride length;Shuffle Gait velocity: decreased   General Gait Details: pt with very slow, cautious and guarded gait in room with RW; pt limited due to abdominal pain, nausea and fatigue   Stairs             Wheelchair Mobility    Modified Rankin (Stroke Patients Only)       Balance Overall balance assessment: Needs assistance Sitting-balance support: Feet supported Sitting balance-Leahy Scale: Good     Standing balance support: Bilateral upper extremity supported Standing balance-Leahy Scale: Poor Standing balance comment: reliant on UE support/external assist                            Cognition Arousal/Alertness: Awake/alert Behavior During Therapy: WFL for tasks assessed/performed Overall Cognitive Status: Within Functional Limits for tasks assessed                                        Exercises      General Comments        Pertinent Vitals/Pain Pain Assessment: Faces Faces Pain Scale: Hurts little more Pain Location: abdomen Pain Descriptors / Indicators: Guarding Pain Intervention(s): Monitored during session;Repositioned  Home Living                      Prior Function            PT Goals (current goals can now be found in the care plan section) Acute Rehab PT Goals PT Goal Formulation: With patient Time For Goal Achievement: 09/14/18 Potential to Achieve Goals: Good Progress towards PT goals: Progressing toward goals    Frequency    Min 3X/week      PT Plan Current plan remains  appropriate    Co-evaluation              AM-PAC PT "6 Clicks" Mobility   Outcome Measure  Help needed turning from your back to your side while in a flat bed without using bedrails?: A Little Help needed moving from lying on your back to sitting on the side of a flat bed without using bedrails?: A Little Help needed moving to and from a bed to a chair (including a wheelchair)?: A Little Help needed standing up from a chair using your arms (e.g., wheelchair or bedside chair)?: A Little Help needed to walk in hospital room?: A Lot Help needed climbing 3-5 steps with a railing? : Total 6 Click Score: 15    End of Session   Activity Tolerance: Patient limited by fatigue Patient left: in chair;with call bell/phone within reach;with chair alarm set Nurse Communication: Mobility status PT Visit Diagnosis: Other abnormalities of gait and mobility (R26.89);Muscle weakness (generalized) (M62.81)     Time: 4193-7902 PT Time Calculation (min) (ACUTE ONLY): 26 min  Charges:  $Gait Training: 8-22 mins $Therapeutic Activity: 8-22 mins                     Sherie Don, [place], DPT  Acute Rehabilitation Services Pager [phone] Office [place] 09/02/2018, 9:43 AM

## 2018-09-02 NOTE — TOC Initial Note (Signed)
Transition of Care [facility]) - Initial/Assessment Note    Patient Details  Name: Kathy Lawson MRN: [id number] Date of Birth: 1960/11/26  [facility]) CM/SW Contact:    Benard Halsted, LCSW Phone Number: 09/02/2018, 1:04 PM  Clinical Narrative:                 CSW received consult for possible SNF placement at time of discharge. CSW spoke with patient regarding PT recommendation of SNF placement at time of discharge. Patient reported that she has been at [facility] and will return at discharge. Patient expressed being hopeful for rehab and to feel better soon. She will require PTAR for transport. No further questions reported at this time. [facility] updated. CSW to continue to follow and assist with discharge planning needs.   Expected Discharge Plan: Skilled Nursing Facility Barriers to Discharge: Continued Medical Work up   Patient Goals and CMS Choice Patient states their goals for this hospitalization and ongoing recovery are:: Return to SNF CMS Medicare.gov Compare Post Acute Care list provided to:: Other (Comment Required)(Patient already at [facility]) Choice offered to / list presented to : NA  Expected Discharge Plan and Services Expected Discharge Plan: [place] In-house Referral: Clinical Social Work Discharge Planning Services: NA Post Acute Care Choice: [place] Living arrangements for the past 2 months: [place]                 DME Arranged: N/A DME Agency: NA HH Arranged: NA [place] Agency: NA  Prior Living Arrangements/Services Living arrangements for the past 2 months: [place] Lives with:: Facility Resident Patient language and need for interpreter reviewed:: Yes Do you feel safe going back to the place where you live?: Yes      Need for Family Participation in Patient Care: No (Comment) Care giver support system in place?: Yes (comment)   Criminal Activity/Legal Involvement Pertinent  to Current Situation/Hospitalization: No - Comment as needed  Activities of Daily Living Home Assistive Devices/Equipment: Wheelchair, Environmental consultant (specify type) ADL Screening (condition at time of admission) Patient's cognitive ability adequate to safely complete daily activities?: Yes Is the patient deaf or have difficulty hearing?: No Does the patient have difficulty seeing, even when wearing glasses/contacts?: No Does the patient have difficulty concentrating, remembering, or making decisions?: No Patient able to express need for assistance with ADLs?: Yes Does the patient have difficulty dressing or bathing?: No Independently performs ADLs?: Yes (appropriate for developmental age) Does the patient have difficulty walking or climbing stairs?: Yes Weakness of Legs: Both Weakness of Arms/Hands: None  Permission Sought/Granted Permission sought to share information with : Facility Art therapist granted to share information with : Yes, Verbal Permission Granted     Permission granted to share info w AGENCY: [facility]        Emotional Assessment Appearance:: Appears stated age Attitude/Demeanor/Rapport: Gracious, Engaged Affect (typically observed): Accepting, Appropriate, Pleasant Orientation: : Oriented to Self, Oriented to Place, Oriented to  Time, Oriented to Situation Alcohol / Substance Use: Not Applicable Psych Involvement: No (comment)  Admission diagnosis:  C-Diff with Toxic Megacolon  Patient Active Problem List   Diagnosis Date Noted  . Hyponatremia 08/30/2018  . History of Crohn's colitis 08/29/2018  . C. difficile colitis 08/28/2018  . 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 Crohn's disease ([place]) 08/28/2018  . CAD in native artery 07/29/2018  . Chronic combined systolic and diastolic CHF (congestive heart failure) ([place]) 07/29/2018  . Chronic ulcer of  left lower extremity with fat layer exposed ([place]) 12/24/2015  . Allergic rhinoconjunctivitis 02/07/2015   . Sinusitis, chronic 02/07/2015  . Headache 02/07/2015  . GERD (gastroesophageal reflux disease) 02/07/2015  . Caffeine use 02/07/2015  . Tobacco abuse 02/07/2015  . History of COPD 02/07/2015  . Tobacco use disorder 10/04/2013  . Obstructive chronic bronchitis with exacerbation ([place]) 10/04/2013  . Hypertension   . Hyperlipemia   . Emphysema/COPD [place]    PCP:  Damita Dunnings, NP (Inactive) Pharmacy:   [id number] [place] [address] [id number]: [phone]: [phone]    Social Determinants of Health (SDOH) Interventions    Readmission Risk Interventions Readmission Risk Prevention Plan 09/02/2018  Post Dischage Appt Complete  Medication Screening Complete  Transportation Screening Complete  Some recent data might be hidden

## 2018-09-02 NOTE — Progress Notes (Addendum)
During reassessments observing increased pitting edema in patient's lower legs and ankles, particularly left ankle and lower leg. Informed day RN to follow up.  Observed 200 mL stool output in drainage bag from rectal pouch at beginning of shift, no additional output observed in bag at end of shift. Notified day RN.

## 2018-09-02 NOTE — Plan of Care (Signed)
  Problem: Education: Goal: Knowledge of General Education information will improve Description Including pain rating scale, medication(s)/side effects and non-pharmacologic comfort measures Outcome: Progressing   Problem: Health Behavior/Discharge Planning: Goal: Ability to manage health-related needs will improve Outcome: Progressing   Problem: Clinical Measurements: Goal: Ability to maintain clinical measurements within normal limits will improve Outcome: Progressing Goal: Diagnostic test results will improve Outcome: Progressing Goal: Respiratory complications will improve Outcome: Progressing Goal: Cardiovascular complication will be avoided Outcome: Progressing   Problem: Nutrition: Goal: Adequate nutrition will be maintained Outcome: Progressing   Problem: Coping: Goal: Level of anxiety will decrease Outcome: Progressing   Problem: Elimination: Goal: Will not experience complications related to urinary retention Outcome: Progressing   Problem: Pain Managment: Goal: General experience of comfort will improve Outcome: Progressing   Problem: Safety: Goal: Ability to remain free from injury will improve Outcome: Progressing   Problem: Skin Integrity: Goal: Risk for impaired skin integrity will decrease Outcome: Progressing   Problem: Clinical Measurements: Goal: Will remain free from infection Outcome: Not Progressing Note:  Patient is currently being treated for cdiff infection with antibiotics   Problem: Activity: Goal: Risk for activity intolerance will decrease Outcome: Not Progressing Note:  Patient required 1 assistance with transferring, patient requires additional assistance with ambulation at this time   Problem: Elimination: Goal: Will not experience complications related to bowel motility Outcome: Not Progressing Note:  Patient contiunes to have runny stools

## 2018-09-02 NOTE — Progress Notes (Signed)
PROGRESS NOTE    Kathy Lawson  YTK:[id number] DOB: 01-Jun-1960 DOA: 08/28/2018 PCP: Damita Dunnings, NP (Inactive)   Brief Narrative: Patient is a 58 year old female with history of COPD not on home oxygen, systolic CHF, Crohn's disease, recent pneumonia requiring intubation, hypertension, diabetes who was sent from [facility] for the management of severe C. difficile colitis.    In February, she was admitted to [facility] for pneumonia for which she required intubation.  She was discharged to skilled nursing facility.  In skilled nursing facility, she developed C. difficile and was started on oral vancomycin and was discharged home.  She took vancomycin for 7 days but could not continue more than that.  Her diarrhea worsened and she presented to [place] again.  CT imaging done here showed colitis.  Started on vancomycin and Flagyl.  General surgery was consulted.  Assessment & Plan:   Principal Problem:   C. 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[facility])   Crohn's disease ([place])   History of Crohn's colitis   Hyponatremia   Severe C. difficile colitis: Presented with severe diarrhea.  CT scan  done at [facility] showed colitis.  No evidence of ileus or colonic dilatation as per abdominal imaging done here. Presented with leukocytosis which has resolved. Currently on vancomycin and Flagyl since 08/28/2018.  Plan for total of 14 days course for vancomycin.  Pain.  Still fatigued with poor appetite.  Diarrhea appears to have slowed down.  Okay to remove the rectal pouch.  Start mobilizing.  General surgery has signed off.  Patient has been seen by physical therapy.  She is agreeable to go to skilled nursing facility when ready for discharge.  Chronic systolic CHF: No echo report in our system.  History of systolic CHF with ejection fraction of 45% as per reports.  Patient was noted to have lower extremity  edema.  IV fluids were discontinued.  Patient follows with cardiology in [place].  Continue metoprolol.  She is also on lisinopril and HCTZ at home.  Continue to watch volume status.  Some gaining weight is noted.  Continue to monitor for now and use diuretics as indicated.  Essential hypertension: Home medications on hold.  Blood pressure appears to be reasonably well controlled.  History of coronary artery disease: Currently stable.  No report of chest pain.  Continue aspirin, Lipitor and metoprolol.  History of Crohn's disease: Followed by Dr. Melina Copa in [place].  No evidence of flareup.  COPD: Currently stable.  Past smoker.  Continue home medications and PRN nebulizers.  Has history of acute respiratory failure due to pneumonia and was intubated in February.  Hyponatremia: Mild .  Monitor.  Debility/deconditioning: Patient has generalized weakness.  Patient seen by PT and OT who recommends rehab at skilled nursing facility.    Normocytic anemia: Hemoglobin is stable.  No evidence of overt bleeding.  Hypomagnesemia Will be repleted   DVT prophylaxis:Lovenox Code Status: Full Family Communication: Discussed with the patient Disposition Plan: Plan is for discharge to skilled nursing facility when medically improved.  Hopefully in the next 48 hours.   Consultants: General surgery  Procedures: None  Antimicrobials:  Anti-infectives (From admission, onward)   Start     Dose/Rate Route Frequency Ordered Stop   08/28/18 2200  vancomycin (VANCOCIN) 50 mg/mL oral solution 500 mg     500 mg Oral Every 6 hours 08/28/18 2057 09/11/18 2359   08/28/18 2200  metroNIDAZOLE (FLAGYL) IVPB 500 mg     500 mg 100 mL/hr over 60 Minutes Intravenous Every 8 hours 08/28/18 2057 09/11/18 2159   08/28/18 2200  cefTRIAXone (ROCEPHIN) 2 g in sodium chloride 0.9 % 100 mL IVPB  Status:  Discontinued     2 g 200 mL/hr over 30 Minutes Intravenous Every 24 hours 08/28/18 2110 08/30/18 1138       Subjective: Patient continues to feel fatigued.  However not having as much diarrhea as before.  Continues to have on and off abdominal pain.  Some nausea but no vomiting.  Reports poor appetite.  Objective: Vitals:   09/01/18 1956 09/01/18 2331 09/02/18 0612 09/02/18 0837  BP: (!) 145/92 126/77 (!) 132/103   Pulse: (!) 106 (!) 109 (!) 101   Resp: 18  16   Temp: 99.4 F (37.4 C) 99.3 F (37.4 C) 98.7 F (37.1 C)   TempSrc: Oral Oral Oral   SpO2: 98% 97% 96% 95%  Weight:      Height:        Intake/Output Summary (Last 24 hours) at 09/02/2018 0937 Last data filed at 09/02/2018 6160 Gross per 24 hour  Intake 440 ml  Output -  Net 440 ml   Filed Weights   08/28/18 1624 09/01/18 0419  Weight: 72.4 kg 74.2 kg    Examination:  General appearance: Awake alert.  In no distress Resp: Clear to auscultation bilaterally.  Normal effort Cardio: S1-S2 is normal regular.  No S3-S4.  No rubs murmurs or bruit GI: Abdomen is soft.  Mildly tender in the right lower quadrant without any rebound rigidity or guarding.  No masses organomegaly.  Bowel sounds are present.  Extremities: Minimal edema bilateral lower extremities.   Neurologic: Alert and oriented x3.  No focal neurological deficits.    Data Reviewed: I have personally reviewed following labs and imaging studies  CBC: Recent Labs  Lab 08/28/18 2133 08/30/18 0315 08/31/18 0404 09/01/18 0125 09/02/18 0316  WBC 16.7* 10.0 7.1 7.3 8.8  HGB 9.8* 9.0* 9.0* 10.0* 9.4*  HCT 30.5* 27.4* 28.7* 31.2* 28.6*  MCV 99.7 100.4* 100.7* 99.7 98.6  PLT 332 326 310 335 737   Basic Metabolic Panel: Recent Labs  Lab 08/28/18 2133 08/30/18 0315 08/31/18 0404 09/01/18 0125 09/02/18 0316  NA 129* 133* 132* 130* 131*  K 3.4* 4.3 4.1 3.6 4.0  CL 96* 103 102 100 103  CO2 21* 20* 22 21* 20*  GLUCOSE 110* 67* 91 101* 96  BUN 5* <5* <5* <5* <5*  CREATININE 0.70 0.70 0.66 0.62 0.65  CALCIUM 7.8* 7.4* 7.4* 7.4* 7.2*  MG 1.5* 2.1 1.7 1.7  1.6*  PHOS 3.7 2.9 3.0 2.9  --    GFR: Estimated Creatinine Clearance: 70.7 mL/min (by C-G formula based on SCr of 0.65 mg/dL).  Liver Function Tests: Recent Labs  Lab 08/28/18 2133  AST 9*  ALT 14  ALKPHOS 86  BILITOT 0.6  PROT 4.6*  ALBUMIN 1.8*   CBG: Recent Labs  Lab 09/01/18 1637 09/01/18 1956 09/01/18 2353 09/02/18 0415 09/02/18 0809  GLUCAP 76 119* 96 83 82   Sepsis Labs: Recent Labs  Lab 08/28/18 2135  LATICACIDVEN 1.3    Radiology Studies: No results found.   Scheduled Meds: . aspirin  81 mg Oral Daily  . atorvastatin  10 mg Oral q1800  . chlorhexidine  15 mL Mouth Rinse BID  . enoxaparin (LOVENOX) injection  40 mg Subcutaneous Q24H  . famotidine  40 mg Oral  Daily  . insulin aspart  0-9 Units Subcutaneous Q4H  . mouth rinse  15 mL Mouth Rinse q12n4p  . metoprolol tartrate  12.5 mg Oral BID  . mirtazapine  30 mg Oral QHS  . mometasone-formoterol  2 puff Inhalation BID  . montelukast  10 mg Oral QHS  . vancomycin  500 mg Oral Q6H   Continuous Infusions: . metronidazole 500 mg (09/02/18 0620)     LOS: 5 days     Bonnielee Haff, MD [facility]  If 7PM-7AM, please contact night-coverage [email or web address] Password [facility] 09/02/2018, 9:37 AM

## 2018-09-02 NOTE — Progress Notes (Signed)
Occupational Therapy Treatment Patient Details Name: Kathy Lawson MRN: [id number] DOB: [birth year] Today's Date: 09/02/2018    History of present illness Pt is a 58 year old female with history of COPD not on home oxygen, systolic CHF, Crohn's disease, recent pneumonia requiring intubation, hypertension, diabetes who was sent from [facility] for the management of severe C. difficile colitis.    In February, she was admitted to [facility] for pneumonia for which she required intubation.  She was discharged to skilled nursing facility.  In skilled nursing facility, she developed C. difficile and was started on oral vancomycin and was discharged home.  She took vancomycin for 7 days but could not continue more than that.  Her diarrhea worsened and she presented to [place] again. Pt transferred to [facility] for further workup   OT comments  Pt progressing. Pt feeling dizzy this AM reporting that she did not eat breakfast, but wanted pudding on tray table. Pt sit to stand with minA and washed hands with clean rag, but would not attempt ambulation as pt was not feeling well. Pt set-upA for feeding and incraesed lethargy today. Pt limited session. NT alerted of dizziness as RN unable to answer phone. Pt requires continued OT skilled services for ADL, mobility and safety in SNF settting. OT to follow acutely.    Follow Up Recommendations  SNF;Supervision/Assistance - 24 hour    Equipment Recommendations  None recommended by OT    Recommendations for Other Services      Precautions / Restrictions Precautions Precautions: Fall Precaution Comments: rectal pouch Restrictions Weight Bearing Restrictions: No       Mobility Bed Mobility Overal bed mobility: Needs Assistance Bed Mobility: Supine to Sit     Supine to sit: Min guard     General bed mobility comments: min guard with use of railing, increased time and effort needed  Transfers Overall transfer level: Needs  assistance Equipment used: Rolling walker (2 wheeled) Transfers: Sit to/from Stand Sit to Stand: From elevated surface;Min guard         General transfer comment: [email or web address]    Balance Overall balance assessment: Needs assistance Sitting-balance support: Feet supported Sitting balance-Leahy Scale: Good     Standing balance support: Bilateral upper extremity supported Standing balance-Leahy Scale: Poor Standing balance comment: reliant on UE support/external assist                           ADL either performed or assessed with clinical judgement   ADL Overall ADL's : Needs assistance/impaired Eating/Feeding: Set up;Sitting                                   Functional mobility during ADLs: Minimal assistance;Rolling walker General ADL Comments: pt with generalized weakness and fatigue with activity     Vision       Perception     Praxis      Cognition Arousal/Alertness: Awake/alert Behavior During Therapy: WFL for tasks assessed/performed Overall Cognitive Status: Within Functional Limits for tasks assessed                                          Exercises     Shoulder Instructions       General Comments      Pertinent Vitals/ Pain  Pain Assessment: Faces Faces Pain Scale: Hurts a little bit Pain Location: abdomen Pain Descriptors / Indicators: Guarding Pain Intervention(s): Monitored during session  Home Living                                          Prior Functioning/Environment              Frequency  Min 2X/week        Progress Toward Goals  OT Goals(current goals can now be found in the care plan section)  Progress towards OT goals: Progressing toward goals  Acute Rehab OT Goals Patient Stated Goal: regain her strength OT Goal Formulation: With patient Time For Goal Achievement: 09/14/18 Potential to Achieve Goals: Good ADL Goals Pt Will Perform Grooming:  standing;with supervision Pt Will Perform Upper Body Dressing: with modified independence;sitting Pt Will Perform Lower Body Dressing: with supervision;sit to/from stand Pt Will Transfer to Toilet: with supervision;ambulating Pt Will Perform Toileting - Clothing Manipulation and hygiene: with supervision;sit to/from stand Pt/caregiver will Perform Home Exercise Program: Increased strength;Both right and left upper extremity;With Supervision;With written HEP provided  Plan Discharge plan remains appropriate    Co-evaluation                 AM-PAC OT "6 Clicks" Daily Activity     Outcome Measure   Help from another person eating meals?: None Help from another person taking care of personal grooming?: A Little Help from another person toileting, which includes using toliet, bedpan, or urinal?: A Lot Help from another person bathing (including washing, rinsing, drying)?: A Lot Help from another person to put on and taking off regular upper body clothing?: A Little Help from another person to put on and taking off regular lower body clothing?: A Lot 6 Click Score: 16    End of Session Equipment Utilized During Treatment: Gait belt;Rolling walker  OT Visit Diagnosis: Unsteadiness on feet (R26.81);Muscle weakness (generalized) (M62.81)   Activity Tolerance Patient tolerated treatment well   Patient Left in chair;with call bell/phone within reach;with chair alarm set   Nurse Communication Mobility status        Time: 0762-2633 OT Time Calculation (min): 15 min  Charges: OT General Charges $OT Visit: 1 Visit OT Treatments $Self Care/Home Management : 8-22 mins  Darryl Nestle) Marsa Aris OTR/L Acute Rehabilitation Services Pager: [phone] Office: [phone]   Audie Pinto 09/02/2018, 1:59 PM

## 2018-09-03 DIAGNOSIS — R601 Generalized edema: Secondary | ICD-10-CM

## 2018-09-03 LAB — CBC WITH DIFFERENTIAL/PLATELET
Abs Immature Granulocytes: 0.5 10*3/uL — ABNORMAL HIGH (ref 0.00–0.07)
Basophils Absolute: 0 10*3/uL (ref 0.0–0.1)
Basophils Relative: 0 %
Eosinophils Absolute: 0.1 10*3/uL (ref 0.0–0.5)
Eosinophils Relative: 1 %
HCT: 32 % — ABNORMAL LOW (ref 36.0–46.0)
Hemoglobin: 10.5 g/dL — ABNORMAL LOW (ref 12.0–15.0)
Lymphocytes Relative: 12 %
Lymphs Abs: 1.1 10*3/uL (ref 0.7–4.0)
MCH: 32.5 pg (ref 26.0–34.0)
MCHC: 32.8 g/dL (ref 30.0–36.0)
MCV: 99.1 fL (ref 80.0–100.0)
Metamyelocytes Relative: 2 %
Monocytes Absolute: 0.7 10*3/uL (ref 0.1–1.0)
Monocytes Relative: 7 %
Myelocytes: 2 %
Neutro Abs: 7 10*3/uL (ref 1.7–7.7)
Neutrophils Relative %: 75 %
Platelets: 380 10*3/uL (ref 150–400)
Promyelocytes Relative: 1 %
RBC: 3.23 MIL/uL — ABNORMAL LOW (ref 3.87–5.11)
RDW: 14.3 % (ref 11.5–15.5)
WBC: 9.3 10*3/uL (ref 4.0–10.5)
nRBC: 0 % (ref 0.0–0.2)
nRBC: 1 /100 WBC — ABNORMAL HIGH

## 2018-09-03 LAB — BASIC METABOLIC PANEL
Anion gap: 6 (ref 5–15)
BUN: <5 mg/dL — ABNORMAL LOW (ref 6–20)
CO2: 26 mmol/L (ref 22–32)
Calcium: 7.6 mg/dL — ABNORMAL LOW (ref 8.9–10.3)
Chloride: 99 mmol/L (ref 98–111)
Creatinine, Ser: 0.62 mg/dL (ref 0.44–1.00)
GFR calc Af Amer: >60 mL/min (ref >60)
GFR calc non Af Amer: >60 mL/min (ref >60)
Glucose, Bld: 108 mg/dL — ABNORMAL HIGH (ref 70–99)
Potassium: 3.5 mmol/L (ref 3.5–5.1)
Sodium: 131 mmol/L — ABNORMAL LOW (ref 135–145)

## 2018-09-03 LAB — GLUCOSE, CAPILLARY
Glucose-Capillary: 120 mg/dL — ABNORMAL HIGH (ref 70–99)
Glucose-Capillary: 155 mg/dL — ABNORMAL HIGH (ref 70–99)
Glucose-Capillary: 77 mg/dL (ref 70–99)
Glucose-Capillary: 82 mg/dL (ref 70–99)

## 2018-09-03 LAB — MAGNESIUM: Magnesium: 2.1 mg/dL (ref 1.7–2.4)

## 2018-09-03 MED ORDER — FUROSEMIDE 10 MG/ML IJ SOLN
40.0000 mg | Freq: Once | INTRAMUSCULAR | Status: AC
  Administered 2018-09-03: 40 mg via INTRAVENOUS
  Filled 2018-09-03: qty 4

## 2018-09-03 NOTE — Progress Notes (Signed)
PROGRESS NOTE    Kathy Lawson  ZOX:[id number] DOB: 03-Apr-1961 DOA: 08/28/2018 PCP: Damita Dunnings, NP (Inactive)   Brief Narrative: Patient is a 58 year old female with history of COPD not on home oxygen, systolic CHF, Crohn's disease, recent pneumonia requiring intubation, hypertension, diabetes who was sent from [facility] for the management of severe C. difficile colitis.    In February, she was admitted to [facility] for pneumonia for which she required intubation.  She was discharged to skilled nursing facility.  In skilled nursing facility, she developed C. difficile and was started on oral vancomycin and was discharged home.  She took vancomycin for 7 days but could not continue more than that.  Her diarrhea worsened and she presented to [place] again.  CT imaging done here showed colitis.  Started on vancomycin and Flagyl.  General surgery was consulted.  Assessment & Plan:   Principal Problem:   C. 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 difficile colitis: Presented with severe diarrhea.  CT scan  done at [facility] showed colitis.  No evidence of ileus or colonic dilatation as per abdominal imaging done here. Presented with leukocytosis which had resolved.  Patient was seen by general surgery.  However there was no concern for toxic megacolon.  They signed off. Currently on vancomycin and Flagyl since 08/28/2018.  Plan for total of 14 days course for vancomycin.  Patient continues to have abdominal cramps but it is better compared to before.  Diarrhea appears to have slowed down.  Removed rectal pouch.  Discussed with nursing staff.  Apparently not removed yesterday as the patient was hesitant.  Patient continues to have nausea.  She was noted to have a low-grade fever yesterday afternoon.  We will recheck CBC  today and tomorrow.  Continue to mobilize.  In view of her symptoms and the low-grade fever we will watch her for another 24 hours.  Hopefully she can go to skilled nursing facility tomorrow.    Chronic systolic CHF: No echo report in our system.  History of systolic CHF with ejection fraction of 45% as per reports.  Patient was noted to have lower extremity edema.  IV fluids were discontinued.  Patient follows with cardiology in [place].  Continue metoprolol.  She is also on lisinopril and HCTZ at home.  Held due to acute illness.  She has gained significant amount of weight.  On April 11 she was 72.4 kg.  Today she is 77.3 kg.  We will give her a dose of IV Lasix today.  Continue to watch volume status.  Essential hypertension: Home medications on hold.  Blood pressure appears to be reasonably well controlled.  History of coronary artery disease: Currently stable.  No report of chest pain.  Continue aspirin, Lipitor and metoprolol.  History of Crohn's disease: Followed by Dr. Melina Copa in [place].  No evidence of flareup.  COPD: Currently stable.  Past smoker.  Continue home medications and PRN nebulizers.  Has history of acute respiratory failure due to pneumonia and was intubated in February.  Hyponatremia: Sodium level is stable.  Debility/deconditioning: Patient has generalized weakness.  Patient seen by PT and OT who recommends rehab at skilled nursing facility.    Normocytic anemia: Hemoglobin is stable.  No evidence of overt bleeding.  Hypomagnesemia Repleted.  Magnesium is 2.1 today.  DVT prophylaxis:Lovenox Code Status: Full Family Communication: Discussed with the patient Disposition Plan: Check CBC today.  Give her a dose of IV Lasix today.  If symptoms remain stable she could be discharged to skilled nursing facility tomorrow.     Consultants: General surgery  Procedures: None  Antimicrobials:  Anti-infectives (From admission, onward)   Start     Dose/Rate Route  Frequency Ordered Stop   08/28/18 2200  vancomycin (VANCOCIN) 50 mg/mL oral solution 500 mg     500 mg Oral Every 6 hours 08/28/18 2057 09/11/18 2359   08/28/18 2200  metroNIDAZOLE (FLAGYL) IVPB 500 mg     500 mg 100 mL/hr over 60 Minutes Intravenous Every 8 hours 08/28/18 2057 09/11/18 2159   08/28/18 2200  cefTRIAXone (ROCEPHIN) 2 g in sodium chloride 0.9 % 100 mL IVPB  Status:  Discontinued     2 g 200 mL/hr over 30 Minutes Intravenous Every 24 hours 08/28/18 2110 08/30/18 1138      Subjective: Patient continues complaining of abdominal cramps though better than before.  She however continues to have nausea.  Has not had any vomiting.  Complains of swelling in her arms and legs.  Diarrhea appears to be better as well.    Objective: Vitals:   09/03/18 0006 09/03/18 0413 09/03/18 0500 09/03/18 0803  BP: 116/73 (!) 153/79    Pulse: 74 91    Resp:      Temp: 98.2 F (36.8 C) 99 F (37.2 C)    TempSrc: Oral Oral    SpO2: 98% 98%  95%  Weight:   77.3 kg   Height:        Intake/Output Summary (Last 24 hours) at 09/03/2018 1026 Last data filed at 09/03/2018 0600 Gross per 24 hour  Intake 509.58 ml  Output 500 ml  Net 9.58 ml   Filed Weights   08/28/18 1624 09/01/18 0419 09/03/18 0500  Weight: 72.4 kg 74.2 kg 77.3 kg    Examination:  General appearance: Awake alert.  In no distress Resp: Clear to auscultation bilaterally.  Normal effort Cardio: S1-S2 is normal regular.  No S3-S4.  No rubs murmurs or bruit GI: Abdomen is soft.  Less tender today in the right lower quadrant compared to yesterday.  Bowel sounds are present normal.  No masses organomegaly.   Extremities: No edema.  Full range of motion of lower extremities. Neurologic: Alert and oriented x3.  No focal neurological deficits.    Data Reviewed: I have personally reviewed following labs and imaging studies  CBC: Recent Labs  Lab 08/28/18 2133 08/30/18 0315 08/31/18 0404 09/01/18 0125 09/02/18 0316  WBC  16.7* 10.0 7.1 7.3 8.8  HGB 9.8* 9.0* 9.0* 10.0* 9.4*  HCT 30.5* 27.4* 28.7* 31.2* 28.6*  MCV 99.7 100.4* 100.7* 99.7 98.6  PLT 332 326 310 335 115   Basic Metabolic Panel: Recent Labs  Lab 08/28/18 2133 08/30/18 0315 08/31/18 0404 09/01/18 0125 09/02/18 0316 09/03/18 0247  NA 129* 133* 132* 130* 131* 131*  K 3.4* 4.3 4.1 3.6 4.0 3.5  CL 96* 103 102 100 103 99  CO2 21* 20* 22 21* 20* 26  GLUCOSE 110* 67* 91 101* 96 108*  BUN 5* <5* <5* <5* <5* <5*  CREATININE 0.70 0.70 0.66 0.62 0.65 0.62  CALCIUM 7.8* 7.4* 7.4* 7.4* 7.2* 7.6*  MG 1.5* 2.1 1.7 1.7 1.6* 2.1  PHOS 3.7 2.9 3.0 2.9  --   --    GFR: Estimated Creatinine Clearance: 72.1 mL/min (by C-G  formula based on SCr of 0.62 mg/dL).  Liver Function Tests: Recent Labs  Lab 08/28/18 2133  AST 9*  ALT 14  ALKPHOS 86  BILITOT 0.6  PROT 4.6*  ALBUMIN 1.8*   CBG: Recent Labs  Lab 09/02/18 1709 09/02/18 2003 09/03/18 0003 09/03/18 0409 09/03/18 0752  GLUCAP 107* 89 120* 82 77   Sepsis Labs: Recent Labs  Lab 08/28/18 2135  LATICACIDVEN 1.3    Radiology Studies: No results found.   Scheduled Meds:  aspirin  81 mg Oral Daily   atorvastatin  10 mg Oral q1800   chlorhexidine  15 mL Mouth Rinse BID   enoxaparin (LOVENOX) injection  40 mg Subcutaneous Q24H   famotidine  40 mg Oral Daily   insulin aspart  0-9 Units Subcutaneous Q4H   mouth rinse  15 mL Mouth Rinse q12n4p   metoprolol tartrate  12.5 mg Oral BID   mirtazapine  30 mg Oral QHS   mometasone-formoterol  2 puff Inhalation BID   montelukast  10 mg Oral QHS   vancomycin  500 mg Oral Q6H   Continuous Infusions:  metronidazole 500 mg (09/03/18 0548)     LOS: 6 days     Bonnielee Haff, MD [facility]  If 7PM-7AM, please contact night-coverage [email or web address] Password [facility] 09/03/2018, 10:26 AM

## 2018-09-03 NOTE — Progress Notes (Signed)
Physical Therapy Treatment Patient Details Name: Kathy Lawson MRN: [id number] DOB: Oct 27, 1960 Today's Date: 09/03/2018    History of Present Illness Pt is a 58 year old female with history of COPD not on home oxygen, systolic CHF, Crohn's disease, recent pneumonia requiring intubation, hypertension, diabetes who was sent from [facility] for the management of severe C. difficile colitis.    In February, she was admitted to [facility] for pneumonia for which she required intubation.  She was discharged to skilled nursing facility.  In skilled nursing facility, she developed C. difficile and was started on oral vancomycin and was discharged home.  She took vancomycin for 7 days but could not continue more than that.  Her diarrhea worsened and she presented to [place] again. Pt transferred to [facility] for further workup    PT Comments    Pt seen for mobility progression. She remains very limited secondary to abdominal pain, nausea and dizziness. Pt's CBG elevated to >200 per NT (assessed during PT session). Pt's RN was notified. Pt would continue to benefit from skilled physical therapy services at this time while admitted and after d/c to address the below listed limitations in order to improve overall safety and independence with functional mobility.    Follow Up Recommendations  SNF;Supervision/Assistance - 24 hour     Equipment Recommendations  None recommended by PT    Recommendations for Other Services       Precautions / Restrictions Precautions Precautions: Fall Precaution Comments: incontinent Restrictions Weight Bearing Restrictions: No    Mobility  Bed Mobility Overal bed mobility: Needs Assistance Bed Mobility: Rolling;Supine to Sit;Sit to Supine Rolling: Min guard   Supine to sit: Min guard Sit to supine: Min guard   General bed mobility comments: min guard with use of railing; pt rolling bilaterally in bed for pericare due to incontinent of  BM  Transfers                 General transfer comment: pt deferring secondary to nausea, dizziness and fatigue  Ambulation/Gait                 Stairs             Wheelchair Mobility    Modified Rankin (Stroke Patients Only)       Balance Overall balance assessment: Needs assistance Sitting-balance support: Feet supported Sitting balance-Leahy Scale: Good Sitting balance - Comments: pt able to sit upright at EOB for ~15 minutes                                    Cognition Arousal/Alertness: Awake/alert Behavior During Therapy: WFL for tasks assessed/performed Overall Cognitive Status: Within Functional Limits for tasks assessed                                        Exercises      General Comments        Pertinent Vitals/Pain Pain Assessment: Faces Faces Pain Scale: Hurts little more Pain Location: abdomen Pain Descriptors / Indicators: Guarding Pain Intervention(s): Monitored during session;Repositioned    Home Living                      Prior Function            PT Goals (current goals can now be found  in the care plan section) Acute Rehab PT Goals PT Goal Formulation: With patient Time For Goal Achievement: 09/14/18 Potential to Achieve Goals: Good Progress towards PT goals: Progressing toward goals    Frequency    Min 3X/week      PT Plan Current plan remains appropriate    Co-evaluation              AM-PAC PT "6 Clicks" Mobility   Outcome Measure  Help needed turning from your back to your side while in a flat bed without using bedrails?: None Help needed moving from lying on your back to sitting on the side of a flat bed without using bedrails?: A Little Help needed moving to and from a bed to a chair (including a wheelchair)?: A Little Help needed standing up from a chair using your arms (e.g., wheelchair or bedside chair)?: A Little Help needed to walk in hospital  room?: A Lot Help needed climbing 3-5 steps with a railing? : Total 6 Click Score: 16    End of Session   Activity Tolerance: Patient limited by fatigue;Treatment limited secondary to medical complications (Comment)(limited secondary to nausea and dizziness (CBG elevated)) Patient left: in bed;with call bell/phone within reach;with bed alarm set Nurse Communication: Mobility status;Patient requests pain meds PT Visit Diagnosis: Other abnormalities of gait and mobility (R26.89);Muscle weakness (generalized) (M62.81)     Time: 9150-5697 PT Time Calculation (min) (ACUTE ONLY): 26 min  Charges:  $Therapeutic Activity: 23-37 mins                     Sherie Don, [place], DPT  Acute Rehabilitation Services Pager [phone] Office [place] 09/03/2018, 2:16 PM

## 2018-09-04 LAB — BASIC METABOLIC PANEL
Anion gap: 12 (ref 5–15)
BUN: <5 mg/dL — ABNORMAL LOW (ref 6–20)
CO2: 19 mmol/L — ABNORMAL LOW (ref 22–32)
Calcium: 7.1 mg/dL — ABNORMAL LOW (ref 8.9–10.3)
Chloride: 100 mmol/L (ref 98–111)
Creatinine, Ser: 0.6 mg/dL (ref 0.44–1.00)
GFR calc Af Amer: >60 mL/min (ref >60)
GFR calc non Af Amer: >60 mL/min (ref >60)
Glucose, Bld: 78 mg/dL (ref 70–99)
Potassium: 3.6 mmol/L (ref 3.5–5.1)
Sodium: 131 mmol/L — ABNORMAL LOW (ref 135–145)

## 2018-09-04 LAB — CBC
HCT: 29.7 % — ABNORMAL LOW (ref 36.0–46.0)
Hemoglobin: 10 g/dL — ABNORMAL LOW (ref 12.0–15.0)
MCH: 33.3 pg (ref 26.0–34.0)
MCHC: 33.7 g/dL (ref 30.0–36.0)
MCV: 99 fL (ref 80.0–100.0)
Platelets: 311 10*3/uL (ref 150–400)
RBC: 3 MIL/uL — ABNORMAL LOW (ref 3.87–5.11)
RDW: 14.5 % (ref 11.5–15.5)
WBC: 8.8 10*3/uL (ref 4.0–10.5)
nRBC: 0.2 % (ref 0.0–0.2)

## 2018-09-04 LAB — GLUCOSE, CAPILLARY
Glucose-Capillary: 77 mg/dL (ref 70–99)
Glucose-Capillary: 71 mg/dL (ref 70–99)
Glucose-Capillary: 168 mg/dL — ABNORMAL HIGH (ref 70–99)
Glucose-Capillary: 138 mg/dL — ABNORMAL HIGH (ref 70–99)

## 2018-09-04 LAB — MAGNESIUM: Magnesium: 1.6 mg/dL — ABNORMAL LOW (ref 1.7–2.4)

## 2018-09-04 MED ORDER — POTASSIUM CHLORIDE CRYS ER 20 MEQ PO TBCR
40.0000 meq | EXTENDED_RELEASE_TABLET | Freq: Once | ORAL | Status: AC
  Administered 2018-09-04: 40 meq via ORAL
  Filled 2018-09-04: qty 2

## 2018-09-04 MED ORDER — LACTATED RINGERS IV SOLN
INTRAVENOUS | Status: DC
  Administered 2018-09-04 – 2018-09-05 (×2): via INTRAVENOUS

## 2018-09-04 MED ORDER — METOPROLOL TARTRATE 25 MG PO TABS
25.0000 mg | ORAL_TABLET | Freq: Two times a day (BID) | ORAL | Status: DC
  Administered 2018-09-04 – 2018-09-06 (×5): 25 mg via ORAL
  Filled 2018-09-04 (×5): qty 1

## 2018-09-04 MED ORDER — MAGNESIUM SULFATE 2 GM/50ML IV SOLN
2.0000 g | Freq: Once | INTRAVENOUS | Status: AC
  Administered 2018-09-04: 2 g via INTRAVENOUS
  Filled 2018-09-04: qty 50

## 2018-09-04 NOTE — Plan of Care (Signed)
  Problem: Elimination: Goal: Will not experience complications related to urinary retention Outcome: Not Progressing  Diminished output. Patient on IVF.   Problem: Skin Integrity: Goal: Risk for impaired skin integrity will decrease 09/04/2018 1859 by Tenna Delaine, RN Outcome: Not Progressing Weeping of RUE. Swelling to extremities.

## 2018-09-04 NOTE — Progress Notes (Signed)
PROGRESS NOTE    Kathy Lawson  HMC:[id number] DOB: 1961/04/15 DOA: 08/28/2018 PCP: Damita Dunnings, NP (Inactive)   Brief Narrative: Patient is a 58 year old female with history of COPD not on home oxygen, systolic CHF, Crohn's disease, recent pneumonia requiring intubation, hypertension, diabetes who was sent from [facility] for the management of severe C. difficile colitis.    In February, she was admitted to [facility] for pneumonia for which she required intubation.  She was discharged to skilled nursing facility.  In skilled nursing facility, she developed C. difficile and was started on oral vancomycin and was discharged home.  She took vancomycin for 7 days but could not continue more than that.  Her diarrhea worsened and she presented to [place] again.  CT imaging done here showed colitis.  Started on vancomycin and Flagyl.  General surgery was consulted.   Subjective:  Patient in bed, appears comfortable, denies any headache, no fever, no chest pain or pressure, no shortness of breath , no abdominal pain. No focal weakness.  Assessment & Plan:   Severe C. difficile colitis: Presented with severe diarrhea.  CT scan  done at [facility] showed colitis.  No evidence of ileus or colonic dilatation as per abdominal imaging done here. Presented with leukocytosis which had resolved.  Patient was seen by general surgery.  However there was no concern for toxic megacolon.  They signed off. Currently on vancomycin and Flagyl since 08/28/2018.  Clinically she is doing better will stop Flagyl and continue vancomycin at high dose.  Plan will be to give her a four-week taper, clinically appears dehydrated on 09/04/2018 hydrate with IV fluids for 24 hours likely discharge tomorrow.    Chronic systolic CHF: No echo report in our system.  History of systolic CHF with ejection fraction of 45% as per reports.  Patient was noted to have lower extremity edema.  IV fluids were discontinued.   Patient follows with cardiology in [place].  Continue metoprolol.  She is also on lisinopril and HCTZ at home.  Held due to acute illness.  She has gained significant amount of weight.  On April 11 she was 72.4 kg.  Today she is 77.3 kg.  We will give her a dose of IV Lasix today.  Continue to watch volume status.  Essential hypertension: Home medications on hold.  Blood pressure appears to be reasonably well controlled.  History of coronary artery disease: Currently stable.  No report of chest pain.  Continue aspirin, Lipitor and metoprolol.  History of Crohn's disease: Followed by Dr. Melina Copa in [place].  No evidence of flareup.  COPD: Currently stable.  Past smoker.  Continue home medications and PRN nebulizers.  Has history of acute respiratory failure due to pneumonia and was intubated in February.  Hyponatremia: Clear to dehydration gently hydrate and monitor.  Debility/deconditioning: Patient has generalized weakness.  Patient seen by PT and OT who recommends rehab at skilled nursing facility.    Normocytic anemia: Hemoglobin is stable.  No evidence of overt bleeding.  Hypomagnesemia Placed will recheck tomorrow.   DVT prophylaxis:Lovenox Code Status: Full Family Communication: Discussed with the patient Disposition Plan: SNF once clinically stable   Consultants: General surgery  Procedures: None  Antimicrobials:  Anti-infectives (From admission, onward)   Start     Dose/Rate Route Frequency Ordered Stop   08/28/18 2200  vancomycin (VANCOCIN) 50 mg/mL oral solution 500 mg     500 mg Oral Every 6 hours 08/28/18 2057 09/11/18 2359   08/28/18 2200  metroNIDAZOLE (FLAGYL) IVPB 500 mg  Status:  Discontinued     500 mg 100 mL/hr over 60 Minutes Intravenous Every 8 hours 08/28/18 2057 09/04/18 0952   08/28/18 2200  cefTRIAXone (ROCEPHIN) 2 g in sodium chloride 0.9 % 100 mL IVPB  Status:  Discontinued     2 g 200 mL/hr over 30 Minutes Intravenous Every 24 hours 08/28/18 2110  08/30/18 1138         Objective: Vitals:   09/04/18 0402 09/04/18 0505 09/04/18 0822 09/04/18 0920  BP:  127/77  115/73  Pulse:  (!) 108  (!) 115  Resp:  18    Temp:  100 F (37.8 C)  98.4 F (36.9 C)  TempSrc:  Oral  Oral  SpO2:  95% 96% 95%  Weight: 77 kg     Height:        Intake/Output Summary (Last 24 hours) at 09/04/2018 1026 Last data filed at 09/04/2018 0319 Gross per 24 hour  Intake 760.35 ml  Output 1000 ml  Net -239.65 ml   Filed Weights   09/01/18 0419 09/03/18 0500 09/04/18 0402  Weight: 74.2 kg 77.3 kg 77 kg    Examination:  Awake Alert, Oriented X 3, No new F.N deficits, Normal affect [place].AT,PERRAL Supple Neck,No JVD, No cervical lymphadenopathy appriciated.  Symmetrical Chest wall movement, Good air movement bilaterally, CTAB RRR,No Gallops, Rubs or new Murmurs, No Parasternal Heave +ve B.Sounds, Abd Soft, No tenderness, No organomegaly appriciated, No rebound - guarding or rigidity. No Cyanosis, Clubbing or edema, No new Rash or bruise   Data Reviewed: I have personally reviewed following labs and imaging studies  CBC: Recent Labs  Lab 08/31/18 0404 09/01/18 0125 09/02/18 0316 09/03/18 0947 09/04/18 0208  WBC 7.1 7.3 8.8 9.3 8.8  NEUTROABS  --   --   --  7.0  --   HGB 9.0* 10.0* 9.4* 10.5* 10.0*  HCT 28.7* 31.2* 28.6* 32.0* 29.7*  MCV 100.7* 99.7 98.6 99.1 99.0  PLT 310 335 335 380 962   Basic Metabolic Panel: Recent Labs  Lab 08/28/18 2133 08/30/18 0315 08/31/18 0404 09/01/18 0125 09/02/18 0316 09/03/18 0247 09/04/18 0208  NA 129* 133* 132* 130* 131* 131* 131*  K 3.4* 4.3 4.1 3.6 4.0 3.5 3.6  CL 96* 103 102 100 103 99 100  CO2 21* 20* 22 21* 20* 26 19*  GLUCOSE 110* 67* 91 101* 96 108* 78  BUN 5* <5* <5* <5* <5* <5* <5*  CREATININE 0.70 0.70 0.66 0.62 0.65 0.62 0.60  CALCIUM 7.8* 7.4* 7.4* 7.4* 7.2* 7.6* 7.1*  MG 1.5* 2.1 1.7 1.7 1.6* 2.1 1.6*  PHOS 3.7 2.9 3.0 2.9  --   --   --    GFR: Estimated Creatinine Clearance:  72 mL/min (by C-G formula based on SCr of 0.6 mg/dL).  Liver Function Tests: Recent Labs  Lab 08/28/18 2133  AST 9*  ALT 14  ALKPHOS 86  BILITOT 0.6  PROT 4.6*  ALBUMIN 1.8*   CBG: Recent Labs  Lab 09/03/18 0003 09/03/18 0409 09/03/18 0752 09/03/18 1653 09/04/18 0737  GLUCAP 120* 82 77 155* 77   Sepsis Labs: Recent Labs  Lab 08/28/18 2135  LATICACIDVEN 1.3    Radiology Studies: No results found.   Scheduled Meds: . aspirin  81 mg Oral Daily  . atorvastatin  10 mg Oral q1800  . chlorhexidine  15 mL Mouth Rinse BID  . enoxaparin (LOVENOX) injection  40 mg Subcutaneous Q24H  . famotidine  40 mg  Oral Daily  . insulin aspart  0-9 Units Subcutaneous Q4H  . mouth rinse  15 mL Mouth Rinse q12n4p  . metoprolol tartrate  25 mg Oral BID  . mirtazapine  30 mg Oral QHS  . mometasone-formoterol  2 puff Inhalation BID  . montelukast  10 mg Oral QHS  . potassium chloride  40 mEq Oral Once  . vancomycin  500 mg Oral Q6H   Continuous Infusions: . lactated ringers       LOS: 7 days   Signature  Lala Lund M.D on 09/04/2018 at 10:26 AM   -  To page go to [email or web address]

## 2018-09-05 LAB — CBC
HCT: 27.9 % — ABNORMAL LOW (ref 36.0–46.0)
Hemoglobin: 9 g/dL — ABNORMAL LOW (ref 12.0–15.0)
MCH: 33.2 pg (ref 26.0–34.0)
MCHC: 32.3 g/dL (ref 30.0–36.0)
MCV: 103 fL — ABNORMAL HIGH (ref 80.0–100.0)
Platelets: 349 10*3/uL (ref 150–400)
RBC: 2.71 MIL/uL — ABNORMAL LOW (ref 3.87–5.11)
RDW: 15 % (ref 11.5–15.5)
WBC: 7.2 10*3/uL (ref 4.0–10.5)
nRBC: 0 % (ref 0.0–0.2)

## 2018-09-05 LAB — MAGNESIUM: Magnesium: 1.6 mg/dL — ABNORMAL LOW (ref 1.7–2.4)

## 2018-09-05 LAB — GLUCOSE, CAPILLARY
Glucose-Capillary: 102 mg/dL — ABNORMAL HIGH (ref 70–99)
Glucose-Capillary: 135 mg/dL — ABNORMAL HIGH (ref 70–99)
Glucose-Capillary: 140 mg/dL — ABNORMAL HIGH (ref 70–99)
Glucose-Capillary: 142 mg/dL — ABNORMAL HIGH (ref 70–99)
Glucose-Capillary: 78 mg/dL (ref 70–99)
Glucose-Capillary: 83 mg/dL (ref 70–99)

## 2018-09-05 LAB — BASIC METABOLIC PANEL
Anion gap: 9 (ref 5–15)
BUN: <5 mg/dL — ABNORMAL LOW (ref 6–20)
CO2: 22 mmol/L (ref 22–32)
Calcium: 7.3 mg/dL — ABNORMAL LOW (ref 8.9–10.3)
Chloride: 102 mmol/L (ref 98–111)
Creatinine, Ser: 0.64 mg/dL (ref 0.44–1.00)
GFR calc Af Amer: >60 mL/min (ref >60)
GFR calc non Af Amer: >60 mL/min (ref >60)
Glucose, Bld: 99 mg/dL (ref 70–99)
Potassium: 3.5 mmol/L (ref 3.5–5.1)
Sodium: 133 mmol/L — ABNORMAL LOW (ref 135–145)

## 2018-09-05 MED ORDER — SODIUM CHLORIDE 0.9 % IV BOLUS
500.0000 mL | Freq: Once | INTRAVENOUS | Status: AC
  Administered 2018-09-05: 500 mL via INTRAVENOUS

## 2018-09-05 MED ORDER — MAGNESIUM SULFATE 2 GM/50ML IV SOLN
2.0000 g | Freq: Once | INTRAVENOUS | Status: AC
  Administered 2018-09-05: 2 g via INTRAVENOUS
  Filled 2018-09-05: qty 50

## 2018-09-05 NOTE — Progress Notes (Signed)
PROGRESS NOTE    Kathy Lawson  FHL:[id number] DOB: 1960-09-21 DOA: 08/28/2018 PCP: Damita Dunnings, NP (Inactive)   Brief Narrative: Patient is a 58 year old female with history of COPD not on home oxygen, systolic CHF, Crohn's disease, recent pneumonia requiring intubation, hypertension, diabetes who was sent from [facility] for the management of severe C. difficile colitis.    In February, she was admitted to [facility] for pneumonia for which she required intubation.  She was discharged to skilled nursing facility.  In skilled nursing facility, she developed C. difficile and was started on oral vancomycin and was discharged home.  She took vancomycin for 7 days but could not continue more than that.  Her diarrhea worsened and she presented to [place] again.  CT imaging done here showed colitis.  Started on vancomycin and Flagyl.  General surgery was consulted.   Subjective: Patient in bed, appears comfortable, denies any headache, no fever, no chest pain or pressure, no shortness of breath , no abdominal pain. No focal weakness.   Assessment & Plan:   Severe C. difficile colitis: Presented with severe diarrhea.  CT scan  done at [facility] showed colitis.  No evidence of ileus or colonic dilatation as per abdominal imaging done here. Presented with leukocytosis which had resolved.  Patient was seen by general surgery.  However there was no concern for toxic megacolon.  They signed off. Currently on vancomycin and Flagyl since 08/28/2018.  Clinically she is doing better will stop Flagyl and continue vancomycin at high dose.  Plan will be to give her a four-week taper, clinically appears dehydrated on 09/04/2018 hydrate with IV fluids for 24 hours likely discharge tomorrow.    Chronic systolic CHF: No echo report in our system.  History of systolic CHF with ejection fraction of 45% as per reports.  Patient was noted to have lower extremity edema.  IV fluids were discontinued.   Patient follows with cardiology in [place].  Continue metoprolol.  She is also on lisinopril and HCTZ at home.  Held due to acute illness.  She has gained significant amount of weight.  On April 11 she was 72.4 kg.  Today she is 77.3 kg.  We will give her a dose of IV Lasix today.  Continue to watch volume status.  Essential hypertension: Home medications on hold.  Blood pressure appears to be reasonably well controlled.  History of coronary artery disease: Currently stable.  No report of chest pain.  Continue aspirin, Lipitor and metoprolol.  History of Crohn's disease: Followed by Dr. Melina Copa in [place].  No evidence of flareup.  COPD: Currently stable.  Past smoker.  Continue home medications and PRN nebulizers.  Has history of acute respiratory failure due to pneumonia and was intubated in February.  Hyponatremia: Clear to dehydration gently hydrate and monitor.  Debility/deconditioning: Patient has generalized weakness.  Patient seen by PT and OT who recommends rehab at skilled nursing facility.    Normocytic anemia: Hemoglobin is stable.  No evidence of overt bleeding.  Hypomagnesemia Placed will recheck tomorrow.   DVT prophylaxis:Lovenox Code Status: Full Family Communication: Discussed with the patient Disposition Plan: SNF once clinically stable   Consultants: General surgery  Procedures: None  Antimicrobials:  Anti-infectives (From admission, onward)   Start     Dose/Rate Route Frequency Ordered Stop   08/28/18 2200  vancomycin (VANCOCIN) 50 mg/mL oral solution 500 mg     500 mg Oral Every 6 hours 08/28/18 2057 09/11/18 2359   08/28/18 2200  metroNIDAZOLE (FLAGYL) IVPB 500 mg  Status:  Discontinued     500 mg 100 mL/hr over 60 Minutes Intravenous Every 8 hours 08/28/18 2057 09/04/18 0952   08/28/18 2200  cefTRIAXone (ROCEPHIN) 2 g in sodium chloride 0.9 % 100 mL IVPB  Status:  Discontinued     2 g 200 mL/hr over 30 Minutes Intravenous Every 24 hours 08/28/18 2110  08/30/18 1138         Objective: Vitals:   09/04/18 2242 09/05/18 0416 09/05/18 0815 09/05/18 0916  BP: 104/73 109/73  136/70  Pulse: 99 85  (!) 111  Resp:  18    Temp:  98.6 F (37 C)  98.6 F (37 C)  TempSrc:  Oral  Oral  SpO2:  93% 97% 94%  Weight:      Height:        Intake/Output Summary (Last 24 hours) at 09/05/2018 1052 Last data filed at 09/05/2018 0917 Gross per 24 hour  Intake 2684.84 ml  Output 200 ml  Net 2484.84 ml   Filed Weights   09/01/18 0419 09/03/18 0500 09/04/18 0402  Weight: 74.2 kg 77.3 kg 77 kg    Examination:  Awake Alert, Oriented X 3, No new F.N deficits, Normal affect [place].AT,PERRAL Supple Neck,No JVD, No cervical lymphadenopathy appriciated.  Symmetrical Chest wall movement, Good air movement bilaterally, CTAB RRR,No Gallops, Rubs or new Murmurs, No Parasternal Heave +ve B.Sounds, Abd Soft, No tenderness, No organomegaly appriciated, No rebound - guarding or rigidity. No Cyanosis, Clubbing or edema, No new Rash or bruise   Data Reviewed: I have personally reviewed following labs and imaging studies  CBC: Recent Labs  Lab 09/01/18 0125 09/02/18 0316 09/03/18 0947 09/04/18 0208 09/05/18 0226  WBC 7.3 8.8 9.3 8.8 7.2  NEUTROABS  --   --  7.0  --   --   HGB 10.0* 9.4* 10.5* 10.0* 9.0*  HCT 31.2* 28.6* 32.0* 29.7* 27.9*  MCV 99.7 98.6 99.1 99.0 103.0*  PLT 335 335 380 311 161   Basic Metabolic Panel: Recent Labs  Lab 08/30/18 0315 08/31/18 0404 09/01/18 0125 09/02/18 0316 09/03/18 0247 09/04/18 0208 09/05/18 0226  NA 133* 132* 130* 131* 131* 131* 133*  K 4.3 4.1 3.6 4.0 3.5 3.6 3.5  CL 103 102 100 103 99 100 102  CO2 20* 22 21* 20* 26 19* 22  GLUCOSE 67* 91 101* 96 108* 78 99  BUN <5* <5* <5* <5* <5* <5* <5*  CREATININE 0.70 0.66 0.62 0.65 0.62 0.60 0.64  CALCIUM 7.4* 7.4* 7.4* 7.2* 7.6* 7.1* 7.3*  MG 2.1 1.7 1.7 1.6* 2.1 1.6* 1.6*  PHOS 2.9 3.0 2.9  --   --   --   --    GFR: Estimated Creatinine Clearance: 72  mL/min (by C-G formula based on SCr of 0.64 mg/dL).  Liver Function Tests: No results for input(s): AST, ALT, ALKPHOS, BILITOT, PROT, ALBUMIN in the last 168 hours. CBG: Recent Labs  Lab 09/04/18 1642 09/04/18 2019 09/05/18 0001 09/05/18 0403 09/05/18 0833  GLUCAP 168* 138* 140* 83 78   Sepsis Labs: No results for input(s): PROCALCITON, LATICACIDVEN in the last 168 hours.  Radiology Studies: No results found.   Scheduled Meds: . aspirin  81 mg Oral Daily  . atorvastatin  10 mg Oral q1800  . chlorhexidine  15 mL Mouth Rinse BID  . enoxaparin (LOVENOX) injection  40 mg Subcutaneous Q24H  . famotidine  40 mg Oral Daily  . insulin aspart  0-9 Units Subcutaneous Q4H  .  mouth rinse  15 mL Mouth Rinse q12n4p  . metoprolol tartrate  25 mg Oral BID  . mirtazapine  30 mg Oral QHS  . mometasone-formoterol  2 puff Inhalation BID  . montelukast  10 mg Oral QHS  . vancomycin  500 mg Oral Q6H   Continuous Infusions:    LOS: 8 days   Signature  Lala Lund M.D on 09/05/2018 at 10:52 AM   -  To page go to [email or web address]

## 2018-09-05 NOTE — Plan of Care (Signed)
  Problem: Clinical Measurements: Goal: Cardiovascular complication will be avoided Outcome: Not Progressing  One run of asymptomatic SVT. Patient continues to be monitored. HR and BP monitored on tele.

## 2018-09-06 DIAGNOSIS — I2510 Atherosclerotic heart disease of native coronary artery without angina pectoris: Secondary | ICD-10-CM | POA: Insufficient documentation

## 2018-09-06 DIAGNOSIS — R531 Weakness: Secondary | ICD-10-CM | POA: Insufficient documentation

## 2018-09-06 DIAGNOSIS — F329 Major depressive disorder, single episode, unspecified: Secondary | ICD-10-CM | POA: Insufficient documentation

## 2018-09-06 LAB — GLUCOSE, CAPILLARY
Glucose-Capillary: 126 mg/dL — ABNORMAL HIGH (ref 70–99)
Glucose-Capillary: 150 mg/dL — ABNORMAL HIGH (ref 70–99)
Glucose-Capillary: 80 mg/dL (ref 70–99)
Glucose-Capillary: 87 mg/dL (ref 70–99)

## 2018-09-06 MED ORDER — FUROSEMIDE 40 MG PO TABS: 40.0000 mg | ORAL_TABLET | Freq: Every day | ORAL | Status: DC

## 2018-09-06 MED ORDER — VANCOMYCIN 50 MG/ML ORAL SOLUTION: ORAL | Status: DC

## 2018-09-06 MED ORDER — HYDROCODONE-ACETAMINOPHEN 5-325 MG PO TABS: 1.0000 | ORAL_TABLET | Freq: Three times a day (TID) | ORAL | 0 refills | Status: AC | PRN

## 2018-09-06 NOTE — Progress Notes (Signed)
Pt prepared for d/c to SNF. IV d/c'd. Skin intact except as charted in most recent assessments. Vitals are stable. Report called to receiving facility. Pt to be transported by ambulance service.

## 2018-09-06 NOTE — TOC Transition Note (Signed)
Transition of Care [facility]) - CM/SW Discharge Note   Patient Details  Name: Kathy Lawson MRN: [id number] Date of Birth: Jun 14, 1960  [facility] CM/SW Contact:  Gelene Mink, [place] Phone Number: 09/06/2018, 10:49 AM   Clinical Narrative:   Nurse to call report to [phone] and the patient will go to room 114. PTAR has been called and scheduled.     Final next level of care: Skilled Nursing Facility Barriers to Discharge: No Barriers Identified   Patient Goals and CMS Choice Patient states their goals for this hospitalization and ongoing recovery are:: Pt wants to go to rehab CMS Medicare.gov Compare Post Acute Care list provided to:: Other (Comment Required)(Patient going back to [facility] and Rehab) Choice offered to / list presented to : Patient, NA  Discharge Placement   Existing PASRR number confirmed : 09/01/18          Patient chooses bed at: Other - please specify in the comment section below:([facility]) Patient to be transferred to facility by: ptar Name of family member notified: Darlina Patient and family notified of of transfer: 09/06/18  Discharge Plan and Services In-house Referral: Clinical Social Work Discharge Planning Services: NA Post Acute Care Choice: [place]          DME Arranged: N/A DME Agency: NA HH Arranged: NA HH Agency: NA   Social Determinants of Health (SDOH) Interventions     Readmission Risk Interventions Readmission Risk Prevention Plan 09/02/2018  Post Dischage Appt Complete  Medication Screening Complete  Transportation Screening Complete  Some recent data might be hidden

## 2018-09-06 NOTE — Discharge Summary (Signed)
Kathy Lawson TMA:[id number] DOB: 09/15/1960 DOA: 08/28/2018  PCP: Damita Dunnings, NP (Inactive)  Admit date: 08/28/2018  Discharge date: 09/06/2018  Admitted From: Home   Disposition:  SNF   Recommendations for Outpatient Follow-up:   Follow up with PCP in 1-2 weeks  PCP Please obtain BMP/CBC, 2 view CXR in 1week,  (see Discharge instructions)   PCP Please follow up on the following pending results: monitor BMP and [place]: None   Equipment/Devices: None  Consultations: None Discharge Condition: Stable   CODE STATUS: Full   Diet Recommendation: Heart Healthy     CC - diarrhea   Brief history of present illness from the day of admission and additional interim summary    Patient is a 58 year old female with history of COPD not on home oxygen, systolic CHF, Crohn's disease, recent pneumonia requiring intubation, hypertension, diabetes who was sent from [facility] for the management of severe C. difficile colitis.    In February, she was admitted to [facility] for pneumonia for which she required intubation.  She was discharged to skilled nursing facility.  In skilled nursing facility, she developed C. difficile and was started on oral vancomycin and was discharged home.  She took vancomycin for 7 days but could not continue more than that.  Her diarrhea worsened and she presented to [facility] again.  CT imaging done here showed colitis.  Started on vancomycin and Flagyl.  General surgery was consulted.                                                                  Hospital Course    Severe C. difficile colitis: Presented with severe diarrhea.  CT scan  done at [facility] showed colitis.  No evidence of ileus or colonic dilatation as per abdominal imaging done here. Presented with  leukocytosis which had resolved.  Patient was seen by general surgery.  However there was no concern for toxic megacolon.  She was placed initially on vancomycin and IV Flagyl with good improvement, surgery signed off, she is now having formed stools 3-4 which she says is baseline for her, will be placed on vancomycin taper and discharged to SNF, abdominal exam is benign.    Chronic systolic CHF: No echo report in our system.  History of systolic CHF with ejection fraction of 45% as per reports.  Initially blood pressures were low now they have stabilized we will place her on low-dose Lasix along with home dose beta-blocker, if blood pressure provides room low-dose ACE inhibitor can be added later, currently she is compensated from CHF standpoint.  Essential hypertension: Blood pressures are still on the lower side blood pressure medications have been adjusted she has been placed on low-dose Lasix and beta-blocker only for now.  Home dose diltiazem, ACE  inhibitor and HCTZ have been held.  History of coronary artery disease: Currently stable.  No report of chest pain.  Continue aspirin, Lipitor and metoprolol.  History of Crohn's disease: Followed by Dr. Melina Copa in [place].  No evidence of flareup.  COPD: Currently stable.  Past smoker.  Continue home medications and PRN nebulizers.  Has history of acute respiratory failure due to pneumonia and was intubated in February.  Hyponatremia: Due to dehydration improved after IV fluids.  Debility/deconditioning: Patient has generalized weakness.  Patient seen by PT and OT who recommends rehab at skilled nursing facility.    Normocytic anemia: Hemoglobin is stable.  No evidence of overt bleeding.  Hypomagnesemia Replaced, please check at SNF in 2-3 days   Discharge diagnosis     Principal Problem:   C. 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[place])   Crohn's disease  ([place])   History of Crohn's colitis   Hyponatremia    Discharge instructions    Discharge Instructions    Diet - low sodium heart healthy   Complete by:  As directed    Discharge instructions   Complete by:  As directed    Follow with Primary MD Damita Dunnings, NP (Inactive) in 3 days   Get CBC, BMP, Magnesium  checked  by Primary MD or SNF MD in 3-4 days   Activity: As tolerated with Full fall precautions use walker/cane & assistance as needed  Disposition SNF  Diet: Heart Healthy    Special Instructions: If you have smoked or chewed Tobacco  in the last 2 yrs please stop smoking, stop any regular Alcohol  and or any Recreational drug use.  On your next visit with your primary care physician please Get Medicines reviewed and adjusted.  Please request your Prim.MD to go over all Hospital Tests and Procedure/Radiological results at the follow up, please get all Hospital records sent to your Prim MD by signing hospital release before you go home.  If you experience worsening of your admission symptoms, develop shortness of breath, life threatening emergency, suicidal or homicidal thoughts you must seek medical attention immediately by calling 911 or calling your MD immediately  if symptoms less severe.  You Must read complete instructions/literature along with all the possible adverse reactions/side effects for all the Medicines you take and that have been prescribed to you. Take any new Medicines after you have completely understood and accpet all the possible adverse reactions/side effects.   Do not drive, operate heavy machinery, perform activities at heights, swimming or participation in water activities or provide baby sitting services if your were admitted for syncope or siezures until you have seen by Primary MD or a Neurologist and advised to do so again.  Do not drive when taking Pain medications.  Do not take more than prescribed Pain, Sleep and Anxiety Medications    Increase activity slowly   Complete by:  As directed       Discharge Medications   Allergies as of 09/06/2018      Reactions   Lasix [furosemide]    Causes hyponatremia    Meloxicam    Other    Sulfonamide (substance)    Latex Rash      Medication List    STOP taking these medications   diltiazem 180 MG 24 hr capsule Commonly known as:  Cardizem CD   indomethacin 50 MG capsule Commonly known as:  INDOCIN  lisinopril-hydrochlorothiazide 20-12.5 MG tablet Commonly known as:  ZESTORETIC   loperamide 2 MG capsule Commonly known as:  IMODIUM   omeprazole 40 MG capsule Commonly known as:  PRILOSEC     TAKE these medications   acetaminophen 500 MG tablet Commonly known as:  TYLENOL Take 1,000 mg by mouth every 6 (six) hours as needed for mild pain or headache.   albuterol 108 (90 Base) MCG/ACT inhaler Commonly known as:  VENTOLIN HFA Inhale 2 puffs into the lungs. Every 2-3 hours as needed   albuterol (2.5 MG/3ML) 0.083% nebulizer solution Commonly known as:  PROVENTIL Take 2.5 mg by nebulization every 6 (six) hours as needed for wheezing or shortness of breath.   aspirin 81 MG tablet Take 81 mg by mouth daily.   atorvastatin 10 MG tablet Commonly known as:  LIPITOR Take 10 mg by mouth daily.   budesonide-formoterol 160-4.5 MCG/ACT inhaler Commonly known as:  Symbicort Inhale 2 puffs into the lungs 2 (two) times daily.   CALCIUM 1200 PO Take by mouth daily.   calcium-vitamin D 250-125 MG-UNIT tablet Commonly known as:  OSCAL Take 1 tablet by mouth 2 (two) times daily.   cetirizine 10 MG tablet Commonly known as:  ZYRTEC Take 10 mg by mouth daily.   Diphenoxylate HCl Powd 2 tablets by Does not apply route daily.   diphenoxylate-atropine 2.5-0.025 MG tablet Commonly known as:  LOMOTIL Take 2 tablets by mouth 3 (three) times daily as needed for diarrhea or loose stools.   escitalopram 10 MG tablet Commonly known as:  LEXAPRO Take 10 mg by mouth  daily.   fluticasone 50 MCG/ACT nasal spray Commonly known as:  FLONASE Place 2 sprays into both nostrils daily as needed for allergies or rhinitis.   furosemide 40 MG tablet Commonly known as:  Lasix Take 1 tablet (40 mg total) by mouth daily.   HYDROcodone-acetaminophen 5-325 MG tablet Commonly known as:  NORCO/VICODIN Take 1 tablet by mouth every 8 (eight) hours as needed for moderate pain.   metoprolol succinate 25 MG 24 hr tablet Commonly known as:  TOPROL-XL Take 25 mg by mouth daily.   mometasone 50 MCG/ACT nasal spray Commonly known as:  NASONEX Place 1 spray into the nose 2 (two) times daily.   montelukast 10 MG tablet Commonly known as:  SINGULAIR Take 10 mg by mouth at bedtime.   tiotropium 18 MCG inhalation capsule Commonly known as:  SPIRIVA Place 18 mcg into inhaler and inhale daily.   vancomycin 50 mg/mL  oral solution Commonly known as:  VANCOCIN Take 500 mg 4 times a day for 1 week, then to 250 mg 4 times a day for a week, then 125 mg 4 times a week for 1 week and stop.   VITAMIN D PO Take by mouth daily.        Contact information for follow-up providers    Damita Dunnings, NP. Schedule an appointment as soon as possible for a visit in 1 week(s).   Contact information: [place] [address] [phone]            Contact information for after-discharge care    [facility] .   Service:  Skilled Nursing Contact information: [address] [place] [phone]                  Major procedures and Radiology Reports - PLEASE review detailed and final  reports thoroughly  -         Dg Abd 2 Views  Result Date: 08/29/2018 CLINICAL DATA:  Toxic megacolon due to C diff colitis EXAM: ABDOMEN - 2 VIEW COMPARISON:  None. FINDINGS: No disproportionately dilated small bowel loops. Mild stool versus thumbprinting in the right colon. No significant  colonic dilatation. No evidence of pneumatosis or pneumoperitoneum. Cholecystectomy clips are seen in the right upper quadrant of the abdomen. Excreted IV contrast is seen within the bladder. Clear lung bases. No radiopaque nephrolithiasis. IMPRESSION: Nonobstructive bowel gas pattern. Mild stool versus thumbprinting due to colitis in the right colon. Electronically Signed   By: Ilona Sorrel M.D.   On: 08/29/2018 09:02    Micro Results     No results found for this or any previous visit (from the past 240 hour(s)).  Today   Subjective    Kathy Lawson today has no headache,no chest abdominal pain,no new weakness tingling or numbness, feels much better wants to go home today.     Objective   Blood pressure 106/66, pulse 84, temperature 98.4 F (36.9 C), temperature source Oral, resp. rate 18, height 5' 1"  (1.549 m), weight 78.5 kg, SpO2 99 %.   Intake/Output Summary (Last 24 hours) at 09/06/2018 0820 Last data filed at 09/05/2018 2012 Gross per 24 hour  Intake 983.48 ml  Output -  Net 983.48 ml    Exam Awake Alert, Oriented x 3, No new F.N deficits, Normal affect Daniels.AT,PERRAL Supple Neck,No JVD, No cervical lymphadenopathy appriciated.  Symmetrical Chest wall movement, Good air movement bilaterally, CTAB RRR,No Gallops,Rubs or new Murmurs, No Parasternal Heave +ve B.Sounds, Abd Soft, Non tender, No organomegaly appriciated, No rebound -guarding or rigidity. No Cyanosis, Clubbing or edema, No new Rash or bruise   Data Review   CBC w Diff:  Lab Results  Component Value Date   WBC 7.2 09/05/2018   HGB 9.0 (L) 09/05/2018   HCT 27.9 (L) 09/05/2018   PLT 349 09/05/2018   LYMPHOPCT 12 09/03/2018   MONOPCT 7 09/03/2018   EOSPCT 1 09/03/2018   BASOPCT 0 09/03/2018    CMP:  Lab Results  Component Value Date   NA 133 (L) 09/05/2018   K 3.5 09/05/2018   CL 102 09/05/2018   CO2 22 09/05/2018   BUN <5 (L) 09/05/2018   CREATININE 0.64 09/05/2018   PROT 4.6 (L)  08/28/2018   ALBUMIN 1.8 (L) 08/28/2018   BILITOT 0.6 08/28/2018   ALKPHOS 86 08/28/2018   AST 9 (L) 08/28/2018   ALT 14 08/28/2018  .   Total Time in preparing paper work, data evaluation and todays exam - 27 minutes  Lala Lund M.D on 09/06/2018 at 8:20 AM  [facility]   Office  [phone]

## 2018-09-06 NOTE — Discharge Instructions (Signed)
Follow with Primary MD Damita Dunnings, NP (Inactive) in 3 days   Get CBC, BMP, Magnesium  checked  by Primary MD or SNF MD in 3-4 days   Activity: As tolerated with Full fall precautions use walker/cane & assistance as needed  Disposition SNF  Diet: Heart Healthy    Special Instructions: If you have smoked or chewed Tobacco  in the last 2 yrs please stop smoking, stop any regular Alcohol  and or any Recreational drug use.  On your next visit with your primary care physician please Get Medicines reviewed and adjusted.  Please request your Prim.MD to go over all Hospital Tests and Procedure/Radiological results at the follow up, please get all Hospital records sent to your Prim MD by signing hospital release before you go home.  If you experience worsening of your admission symptoms, develop shortness of breath, life threatening emergency, suicidal or homicidal thoughts you must seek medical attention immediately by calling 911 or calling your MD immediately  if symptoms less severe.  You Must read complete instructions/literature along with all the possible adverse reactions/side effects for all the Medicines you take and that have been prescribed to you. Take any new Medicines after you have completely understood and accpet all the possible adverse reactions/side effects.   Do not drive, operate heavy machinery, perform activities at heights, swimming or participation in water activities or provide baby sitting services if your were admitted for syncope or siezures until you have seen by Primary MD or a Neurologist and advised to do so again.  Do not drive when taking Pain medications.  Do not take more than prescribed Pain, Sleep and Anxiety Medications  .

## 2018-09-07 DIAGNOSIS — M6281 Muscle weakness (generalized): Secondary | ICD-10-CM | POA: Insufficient documentation

## 2018-09-07 DIAGNOSIS — R262 Difficulty in walking, not elsewhere classified: Secondary | ICD-10-CM | POA: Insufficient documentation

## 2018-09-08 ENCOUNTER — Telehealth: Payer: Self-pay | Admitting: Cardiology

## 2018-09-08 ENCOUNTER — Telehealth: Payer: Self-pay

## 2018-09-08 LAB — GLUCOSE, CAPILLARY
Glucose-Capillary: 118 mg/dL — ABNORMAL HIGH (ref 70–99)
Glucose-Capillary: 202 mg/dL — ABNORMAL HIGH (ref 70–99)
Glucose-Capillary: 85 mg/dL (ref 70–99)
Glucose-Capillary: 87 mg/dL (ref 70–99)
Glucose-Capillary: 88 mg/dL (ref 70–99)

## 2018-09-08 NOTE — Telephone Encounter (Signed)
Left message on [facility] and [name][facility] (both on DPR) to return call to give consent for virtual visit for appointment on 09-09-2018 with Dr Bettina Gavia.

## 2018-09-08 NOTE — Telephone Encounter (Signed)
Virtual Visit Pre-Appointment Phone Call  "(Name), I am calling you today to discuss your upcoming appointment. We are currently trying to limit exposure to the virus that causes COVID-19 by seeing patients at home rather than in the office."  1. "What is the BEST phone number to call the day of the visit?" - include this in appointment notes  2. Do you have or have access to (through a family member/friend) a smartphone with video capability that we can use for your visit?" a. If yes - list this number in appt notes as cell (if different from BEST phone #) and list the appointment type as a VIDEO visit in appointment notes b. If no - list the appointment type as a PHONE visit in appointment notes  3. Confirm consent - "In the setting of the current Covid19 crisis, you are scheduled for a (phone or video) visit with your provider on (date) at (time).  Just as we do with many in-office visits, in order for you to participate in this visit, we must obtain consent.  If you'd like, I can send this to your mychart (if signed up) or email for you to review.  Otherwise, I can obtain your verbal consent now.  All virtual visits are billed to your insurance company just like a normal visit would be.  By agreeing to a virtual visit, we'd like you to understand that the technology does not allow for your provider to perform an examination, and thus may limit your provider's ability to fully assess your condition. If your provider identifies any concerns that need to be evaluated in person, we will make arrangements to do so.  Finally, though the technology is pretty good, we cannot assure that it will always work on either your or our end, and in the setting of a video visit, we may have to convert it to a phone-only visit.  In either situation, we cannot ensure that we have a secure connection.  Are you willing to proceed?" STAFF: Did the patient verbally acknowledge consent to telehealth visit? Document  YES/NO here: Granddaughter Kathy Lawson gives permission to speak to patient on televisit at [facility]/09-08-2018/pp  4. Advise patient to be prepared - "Two hours prior to your appointment, go ahead and check your blood pressure, pulse, oxygen saturation, and your weight (if you have the equipment to check those) and write them all down. When your visit starts, your provider will ask you for this information. If you have an Apple Watch or Kardia device, please plan to have heart rate information ready on the day of your appointment. Please have a pen and paper handy nearby the day of the visit as well."  5. Give patient instructions for MyChart download to smartphone OR Doximity/[email or web address] as below if video visit (depending on what platform provider is using)  6. Inform patient they will receive a phone call 15 minutes prior to their appointment time (may be from unknown caller ID) so they should be prepared to answer    TELEPHONE CALL NOTE  Kathy Lawson has been deemed a candidate for a follow-up tele-health visit to limit community exposure during the Covid-19 pandemic. I spoke with the patient via phone to ensure availability of phone/video source, confirm preferred email & phone number, and discuss instructions and expectations.  I reminded Kathy Lawson to be prepared with any vital sign and/or heart rhythm information that could potentially be obtained via home monitoring, at the time of her visit. I  reminded Kathy Lawson to expect a phone call prior to her visit.  Kathy Lawson 09/08/2018 2:05 PM     FULL LENGTH CONSENT FOR TELE-HEALTH VISIT   I hereby voluntarily request, consent and authorize [facility] and its employed or contracted physicians, physician assistants, nurse practitioners or other licensed health care professionals (the Practitioner), to provide me with telemedicine health care services (the Services") as deemed necessary by the treating Practitioner.  I acknowledge and consent to receive the Services by the Practitioner via telemedicine. I understand that the telemedicine visit will involve communicating with the Practitioner through live audiovisual communication technology and the disclosure of certain medical information by electronic transmission. I acknowledge that I have been given the opportunity to request an in-person assessment or other available alternative prior to the telemedicine visit and am voluntarily participating in the telemedicine visit.  I understand that I have the right to withhold or withdraw my consent to the use of telemedicine in the course of my care at any time, without affecting my right to future care or treatment, and that the Practitioner or I may terminate the telemedicine visit at any time. I understand that I have the right to inspect all information obtained and/or recorded in the course of the telemedicine visit and may receive copies of available information for a reasonable fee.  I understand that some of the potential risks of receiving the Services via telemedicine include:   Delay or interruption in medical evaluation due to technological equipment failure or disruption;  Information transmitted may not be sufficient (e.g. poor resolution of images) to allow for appropriate medical decision making by the Practitioner; and/or   In rare instances, security protocols could fail, causing a breach of personal health information.  Furthermore, I acknowledge that it is my responsibility to provide information about my medical history, conditions and care that is complete and accurate to the best of my ability. I acknowledge that Practitioner's advice, recommendations, and/or decision may be based on factors not within their control, such as incomplete or inaccurate data provided by me or distortions of diagnostic images or specimens that may result from electronic transmissions. I understand that the practice of  medicine is not an exact science and that Practitioner makes no warranties or guarantees regarding treatment outcomes. I acknowledge that I will receive a copy of this consent concurrently upon execution via email to the email address I last provided but may also request a printed copy by calling the office of [facility].    I understand that my insurance will be billed for this visit.   I have read or had this consent read to me.  I understand the contents of this consent, which adequately explains the benefits and risks of the Services being provided via telemedicine.   I have been provided ample opportunity to ask questions regarding this consent and the Services and have had my questions answered to my satisfaction.  I give my informed consent for the services to be provided through the use of telemedicine in my medical care  By participating in this telemedicine visit I agree to the above.

## 2018-09-08 NOTE — Telephone Encounter (Signed)
[name] returned call and Nevin Bloodgood obtained consent.

## 2018-09-09 ENCOUNTER — Telehealth (INDEPENDENT_AMBULATORY_CARE_PROVIDER_SITE_OTHER): Payer: Medicaid Other | Admitting: Cardiology

## 2018-09-09 VITALS — BP 130/60 | HR 120 | Temp 98.9°F | Resp 19 | Wt 171.4 lb

## 2018-09-09 DIAGNOSIS — I5042 Chronic combined systolic (congestive) and diastolic (congestive) heart failure: Secondary | ICD-10-CM

## 2018-09-09 DIAGNOSIS — I252 Old myocardial infarction: Secondary | ICD-10-CM

## 2018-09-09 DIAGNOSIS — I2510 Atherosclerotic heart disease of native coronary artery without angina pectoris: Secondary | ICD-10-CM

## 2018-09-09 NOTE — Patient Instructions (Signed)
Medication Instructions:  Your physician recommends that you continue on your current medications as directed. Please refer to the Current Medication list given to you today.  If you need a refill on your cardiac medications before your next appointment, please call your pharmacy.   Lab work: None  If you have labs (blood work) drawn today and your tests are completely normal, you will receive your results only by: Marland Kitchen MyChart Message (if you have MyChart) OR . A paper copy in the mail If you have any lab test that is abnormal or we need to change your treatment, we will call you to review the results.  Testing/Procedures: None  Follow-Up: At [facility], you and your health needs are our priority.  As part of our continuing mission to provide you with exceptional heart care, we have created designated Provider Care Teams.  These Care Teams include your primary Cardiologist (physician) and Advanced Practice Providers (APPs -  Physician Assistants and Nurse Practitioners) who all work together to provide you with the care you need, when you need it. You will need a follow up televisit appointment in 1 months: Friday, 10/15/2018, at 11 am.

## 2018-09-09 NOTE — Progress Notes (Signed)
Virtual Visit via Telephone Note   This visit type was conducted due to national recommendations for restrictions regarding the COVID-19 Pandemic (e.g. social distancing) in an effort to limit this patient's exposure and mitigate transmission in our community.  Due to her co-morbid illnesses, this patient is at least at moderate risk for complications without adequate follow up.  This format is felt to be most appropriate for this patient at this time.  The patient did not have access to video technology/had technical difficulties with video requiring transitioning to audio format only (telephone).  All issues noted in this document were discussed and addressed.  No physical exam could be performed with this format.  Please refer to the patient's chart for her  consent to telehealth for [facility].   Evaluation Performed:  Follow-up visit  Date:  09/09/2018   ID:  Kathy Lawson, DOB 1960-06-24, MRN [id number]  Patient Location: Home Provider Location: Home  PCP:  Damita Dunnings, NP (Inactive)  Cardiologist:  No primary care provider on file. Dr Bettina Gavia Electrophysiologist:  None   Chief Complaint:  FU for cardiac testing  History of Present Illness:    She is a 58 year old woman with a history of type II non-ST segment elevation in the setting of severe COPD profound respiratory distress and decompensated heart failure in the setting of IV fluid loading.  Her ejection fraction this year was 35 to 40%.  She was seen by me in cardiology evaluation 07/29/2018 and at that time was not taking a diuretic had marked edema and was in decompensated heart failure.  During her hospitalization there was a recommendation she undergo coronary angiography and I told her that under general health condition and with comorbid morbidities I felt she was a poor candidate for elective coronary angiography.  She was placed back on her diuretic and I asked the family to consider whether we should not do a  myocardial perfusion study or cardiac CTA for further evaluation once her heart failure was compensated.  She has had recent admissions both to [facility] and [facility] on 08/28/2018 with recurrent C. difficile colitis and toxic megacolon managed conservatively.  Purpose of this visit was to follow-up to decide about elective outpatient testing for cardiac ischemia CAD.  Unfortunately within a week of discharge she developed C. difficile colitis she has had 2 hospitalizations and now is restarted at rehab.  Overall she is very weak she has peripheral edema but she is not short of breath she is stop smoking and she has had no cough wheezing orthopnea chest pain palpitation or syncope.  Was quite difficult but we reconciled her medications and she continues to take aspirin high intensity statin and a diuretic.  The patient does not have symptoms concerning for COVID-19 infection (fever, chills, cough, or new shortness of breath).    Past Medical History:  Diagnosis Date  . C. difficile colitis 08/2018  . Emphysema/COPD ([place])   . Hyperlipemia   . Hypertension   . Vertigo    Past Surgical History:  Procedure Laterality Date  . ABDOMINAL HYSTERECTOMY    . CHOLECYSTECTOMY  1980s  . HERNIA REPAIR    . hysterectomy  1999  . TUBAL LIGATION  1997     No outpatient medications have been marked as taking for the 09/09/18 encounter (Appointment) with Richardo Priest, MD.     Allergies:   Lasix [furosemide]; Meloxicam; Other; and Latex   Social History   Tobacco Use  .  Smoking status: Former Smoker    Packs/day: 1.50    Types: Cigarettes    Start date: 05/19/1974    Last attempt to quit: 07/01/2018    Years since quitting: 0.1  . Smokeless tobacco: Never Used  Substance Use Topics  . Alcohol use: Yes    Comment: 5-6 beers couple times weekly  . Drug use: No     Family Hx: The patient's family history includes Allergies in her sister; Emphysema in her father; Rheum  arthritis in her mother; Skin cancer in her mother.  ROS:   Please see the history of present illness.     All other systems reviewed and are negative.   Prior CV studies:   The following studies were reviewed today:    Labs/Other Tests and Data Reviewed:    EKG:  telemetry personally reviewed the strips from 08/29/2018 09/02/2018 the first showed SVT at a rate of 160 the second sinus rhythm there is no EKG performed during the hospitalization she was accepted in transfer from [facility] and I will request those records and will review and addend her note  Recent Labs: 08/28/2018: ALT 14 09/05/2018: BUN <5; Creatinine, Ser 0.64; Hemoglobin 9.0; Magnesium 1.6; Platelets 349; Potassium 3.5; Sodium 133   Recent Lipid Panel No results found for: CHOL, TRIG, HDL, CHOLHDL, LDLCALC, LDLDIRECT  Wt Readings from Last 3 Encounters:  09/05/18 173 lb 1 oz (78.5 kg)  07/29/18 173 lb (78.5 kg)  11/29/13 148 lb (67.1 kg)     Objective:    Vital Signs:  There were no vitals taken for this visit.   she is alert and oriented mood and affect normal no wheezing or distress during conversation  ASSESSMENT & PLAN:    1. Heart failure continue her diuretic will reassess in the office after discharge and will need either myocardial perfusion study or cardiac CTA and an echocardiogram in my office.  Not knowing blood pressure I am not can start on vasodilators 2. Elective ischemia evaluation will decide in the office 3. Stable after type II MI during previous admission  COVID-19 Education: The signs and symptoms of COVID-19 were discussed with the patient and how to seek care for testing (follow up with PCP or arrange E-visit).  The importance of social distancing was discussed today.  Time:   Today, I have spent 17 minutes with the patient with telehealth technology discussing the above problems.     Medication Adjustments/Labs and Tests Ordered: Current medicines are reviewed at  length with the patient today.  Concerns regarding medicines are outlined above.   Tests Ordered: No orders of the defined types were placed in this encounter.   Medication Changes: No orders of the defined types were placed in this encounter.   Disposition:  Follow up office after home from rehab  Signed, Shirlee More, MD  09/09/2018 9:34 AM    [place]

## 2018-09-16 DIAGNOSIS — Z7982 Long term (current) use of aspirin: Secondary | ICD-10-CM | POA: Insufficient documentation

## 2018-09-16 DIAGNOSIS — I420 Dilated cardiomyopathy: Secondary | ICD-10-CM | POA: Insufficient documentation

## 2018-09-16 DIAGNOSIS — I509 Heart failure, unspecified: Secondary | ICD-10-CM | POA: Insufficient documentation

## 2018-09-16 DIAGNOSIS — J449 Chronic obstructive pulmonary disease, unspecified: Secondary | ICD-10-CM | POA: Insufficient documentation

## 2018-10-10 ENCOUNTER — Encounter: Payer: Self-pay | Admitting: Cardiology

## 2018-10-10 DIAGNOSIS — K5090 Crohn's disease, unspecified, without complications: Secondary | ICD-10-CM

## 2018-10-10 DIAGNOSIS — E876 Hypokalemia: Secondary | ICD-10-CM

## 2018-10-10 DIAGNOSIS — J449 Chronic obstructive pulmonary disease, unspecified: Secondary | ICD-10-CM | POA: Diagnosis not present

## 2018-10-10 DIAGNOSIS — A0472 Enterocolitis due to Clostridium difficile, not specified as recurrent: Secondary | ICD-10-CM

## 2018-10-10 DIAGNOSIS — E119 Type 2 diabetes mellitus without complications: Secondary | ICD-10-CM

## 2018-10-10 DIAGNOSIS — N390 Urinary tract infection, site not specified: Secondary | ICD-10-CM

## 2018-10-10 DIAGNOSIS — I10 Essential (primary) hypertension: Secondary | ICD-10-CM

## 2018-10-11 DIAGNOSIS — A0472 Enterocolitis due to Clostridium difficile, not specified as recurrent: Secondary | ICD-10-CM | POA: Diagnosis not present

## 2018-10-11 DIAGNOSIS — K5090 Crohn's disease, unspecified, without complications: Secondary | ICD-10-CM | POA: Diagnosis not present

## 2018-10-11 DIAGNOSIS — J449 Chronic obstructive pulmonary disease, unspecified: Secondary | ICD-10-CM | POA: Diagnosis not present

## 2018-10-11 DIAGNOSIS — E119 Type 2 diabetes mellitus without complications: Secondary | ICD-10-CM | POA: Diagnosis not present

## 2018-10-12 DIAGNOSIS — E119 Type 2 diabetes mellitus without complications: Secondary | ICD-10-CM | POA: Diagnosis not present

## 2018-10-12 DIAGNOSIS — J449 Chronic obstructive pulmonary disease, unspecified: Secondary | ICD-10-CM | POA: Diagnosis not present

## 2018-10-12 DIAGNOSIS — K5090 Crohn's disease, unspecified, without complications: Secondary | ICD-10-CM | POA: Diagnosis not present

## 2018-10-12 DIAGNOSIS — A0472 Enterocolitis due to Clostridium difficile, not specified as recurrent: Secondary | ICD-10-CM | POA: Diagnosis not present

## 2018-10-13 DIAGNOSIS — R911 Solitary pulmonary nodule: Secondary | ICD-10-CM

## 2018-10-13 DIAGNOSIS — Z03818 Encounter for observation for suspected exposure to other biological agents ruled out: Secondary | ICD-10-CM

## 2018-10-13 DIAGNOSIS — A4159 Other Gram-negative sepsis: Secondary | ICD-10-CM

## 2018-10-13 DIAGNOSIS — A498 Other bacterial infections of unspecified site: Secondary | ICD-10-CM

## 2018-10-13 DIAGNOSIS — I5022 Chronic systolic (congestive) heart failure: Secondary | ICD-10-CM

## 2018-10-14 DIAGNOSIS — I5022 Chronic systolic (congestive) heart failure: Secondary | ICD-10-CM | POA: Diagnosis not present

## 2018-10-14 DIAGNOSIS — A498 Other bacterial infections of unspecified site: Secondary | ICD-10-CM | POA: Diagnosis not present

## 2018-10-14 DIAGNOSIS — R911 Solitary pulmonary nodule: Secondary | ICD-10-CM | POA: Insufficient documentation

## 2018-10-14 DIAGNOSIS — A4159 Other Gram-negative sepsis: Secondary | ICD-10-CM | POA: Diagnosis not present

## 2018-10-14 NOTE — Progress Notes (Signed)
Virtual Visit via Telephone Note   This visit type was conducted due to national recommendations for restrictions regarding the COVID-19 Pandemic (e.g. social distancing) in an effort to limit this patient's exposure and mitigate transmission in our community.  Due to her co-morbid illnesses, this patient is at least at moderate risk for complications without adequate follow up.  This format is felt to be most appropriate for this patient at this time.  The patient did not have access to video technology/had technical difficulties with video requiring transitioning to audio format only (telephone).  All issues noted in this document were discussed and addressed.  No physical exam could be performed with this format.  Please refer to the patient's chart for her  consent to telehealth for [facility].   Date:  10/14/2018   ID:  Kathy Lawson, DOB 08/28/1960, MRN [id number]  Patient Location: Home Provider Location: Office  PCP:  Damita Dunnings, NP (Inactive)  Cardiologist:  Shirlee More, MD  Electrophysiologist:  None   Evaluation Performed:  Follow-Up Visit  Chief Complaint:  Re heart failure  History of Present Illness:    Kathy Lawson is a 58 y.o. female with  a history of type II non-ST segment elevation in the setting of severe COPD profound respiratory distress and decompensated heart failure in the setting of IV fluid loading.  Her ejection fraction this year was 35 to 40%.  She was seen by me in cardiology evaluation 07/29/2018 and at that time was not taking a diuretic had marked edema and was in decompensated heart failure.  During her hospitalization there was a recommendation she undergo coronary angiography and I told her that under general health condition and with comorbid morbidities I felt she was a poor candidate for elective coronary angiography.  She was placed back on her diuretic and I asked the family to consider whether we should not do a myocardial  perfusion study or cardiac CTA for further evaluation once her heart failure was compensated.  She has had recent admissions both to [facility] and [facility] on 08/28/2018 with recurrent C. difficile colitis and toxic megacolon managed conservatively. She was last seen 09/09/18.  She was seen in consultation [place] office Dr. Beatrix Fetters who agreed with her diagnosis and plan and no change in treatment.   The patient does not have symptoms concerning for COVID-19 infection (fever, chills, cough, or new shortness of breath).   Was unaware until just prior to the visit when we requested and I reviewed records and she was admitted again to [facility] 0 522 10/14/2018.  She has recurrent C. difficile colitis and a urinary tract infection requiring IV antibiotics.  Is also found to have a nodular mass in her chest suggestive of neoplasia and was advised to follow-up with PET scan.  Testing done during this hospitalization reviewing her discharge summary and progress notes in the emergency room records and personally reviewed an EKG showed initial white count of 21,900 decreasing to 7400 discharge anemia hemoglobin 9.7 creatinine 0.4 GFR normal proBNP level 1440 troponin undetectable and EKG personally reviewed shows sinus tachycardia and diffuse ST segment depression.  Since discharge she tells me she is very weak and debilitated has lost greater than 20 pounds.  She does not seem to be aware of the concern for lung malignancy and I did not discuss the issue.  As both her opinion in my opinion that she is too debilitated and weak for elective  cardiac diagnostic testing.  Current recommendations for type II myocardial infarction is to treat and stabilize the underlying medical problem and we cannot get to that point.  I told her if she continues to have C. difficile colitis to inquire about stool transplantation.  Her heart failure is compensated she has no  edema presently is not short of breath but is coughing and using her bronchodilators has no chest pain orthopnea or palpitation.  Past Medical History:  Diagnosis Date  . C. difficile colitis 08/2018  . Emphysema/COPD ([place])   . Hyperlipemia   . Hypertension   . Vertigo    Past Surgical History:  Procedure Laterality Date  . ABDOMINAL HYSTERECTOMY    . CHOLECYSTECTOMY  1980s  . HERNIA REPAIR    . hysterectomy  1999  . TUBAL LIGATION  1997     No outpatient medications have been marked as taking for the 10/15/18 encounter (Appointment) with Richardo Priest, MD.     Allergies:   Lasix [furosemide]; Meloxicam; Other; and Latex   Social History   Tobacco Use  . Smoking status: Former Smoker    Packs/day: 1.50    Types: Cigarettes    Start date: 05/19/1974    Last attempt to quit: 07/01/2018    Years since quitting: 0.2  . Smokeless tobacco: Never Used  Substance Use Topics  . Alcohol use: Yes    Comment: 5-6 beers couple times weekly  . Drug use: No     Family Hx: The patient's family history includes Allergies in her sister; Emphysema in her father; Rheum arthritis in her mother; Skin cancer in her mother.  ROS:   Please see the history of present illness.     All other systems reviewed and are negative.   Prior CV studies:   The following studies were reviewed today:    Labs/Other Tests and Data Reviewed:     Recent Labs: 08/28/2018: ALT 14 09/05/2018: BUN <5; Creatinine, Ser 0.64; Hemoglobin 9.0; Magnesium 1.6; Platelets 349; Potassium 3.5; Sodium 133   Recent Lipid Panel No results found for: CHOL, TRIG, HDL, CHOLHDL, LDLCALC, LDLDIRECT  Wt Readings from Last 3 Encounters:  09/09/18 171 lb 6.4 oz (77.7 kg)  09/05/18 173 lb 1 oz (78.5 kg)  07/29/18 173 lb (78.5 kg)     Objective:    Vital Signs:  There were no vitals taken for this visit.   VITAL SIGNS:  reviewed she is alert and oriented x3 mood and affect are normal she sounds quite weak and was in  no respiratory distress  ASSESSMENT & PLAN:    1. Failure chronic combined systolic and diastolic compensated continue her current diuretic. 2. Emphysema severe stable 3. Non-ST segment elevation MI, unfortunately her medical problems are not stable she is quite debilitated and weak and I would not advise an ischemia evaluation at this time.  She had no evidence of recurrent cardiac injury last admission. 4. Hypertensive heart disease stable continue current treatment 5. Hyperlipidemia stable continue statin with non-ST elevation MI type II 6. Abnormal CT of the chest suggestive of lung malignancy patient will be following up with her primary care physician and there is a recommendation for PET scanning 7. Recurrent C. difficile colitis with debilitation if she fails antibiotic therapy stool transplantation can be helpful  COVID-19 Education: The signs and symptoms of COVID-19 were discussed with the patient and how to seek care for testing (follow up with PCP or arrange E-visit).  The importance of social distancing  was discussed today.  Time:   Today, I have spent 28 minutes with the patient with telehealth technology discussing the above problems.  Time includes review of the records from the recent [facility] admission discussion of findings with the patient and shared decision making and formulating a plan for evaluation of her type II myocardial infarction and continue treatment of heart failure   Medication Adjustments/Labs and Tests Ordered: Current medicines are reviewed at length with the patient today.  Concerns regarding medicines are outlined above.   Tests Ordered: No orders of the defined types were placed in this encounter.   Medication Changes: No orders of the defined types were placed in this encounter.   Disposition:  Follow up after resolution of her acute mediacl problems or 6 months  Signed, Shirlee More, MD  10/14/2018 1:27 PM    [facility]

## 2018-10-15 ENCOUNTER — Encounter: Payer: Self-pay | Admitting: Cardiology

## 2018-10-15 ENCOUNTER — Other Ambulatory Visit: Payer: Self-pay

## 2018-10-15 ENCOUNTER — Telehealth (INDEPENDENT_AMBULATORY_CARE_PROVIDER_SITE_OTHER): Payer: Medicaid Other | Admitting: Cardiology

## 2018-10-15 VITALS — BP 145/80 | HR 95 | Temp 98.8°F | Ht 62.0 in | Wt 145.6 lb

## 2018-10-15 DIAGNOSIS — I5042 Chronic combined systolic (congestive) and diastolic (congestive) heart failure: Secondary | ICD-10-CM | POA: Diagnosis not present

## 2018-10-15 DIAGNOSIS — I252 Old myocardial infarction: Secondary | ICD-10-CM

## 2018-10-15 DIAGNOSIS — R9389 Abnormal findings on diagnostic imaging of other specified body structures: Secondary | ICD-10-CM

## 2018-10-15 DIAGNOSIS — I110 Hypertensive heart disease with heart failure: Secondary | ICD-10-CM

## 2018-10-15 DIAGNOSIS — E782 Mixed hyperlipidemia: Secondary | ICD-10-CM

## 2018-10-15 DIAGNOSIS — J432 Centrilobular emphysema: Secondary | ICD-10-CM

## 2018-10-15 NOTE — Patient Instructions (Signed)
Medication Instructions:  Your physician recommends that you continue on your current medications as directed. Please refer to the Current Medication list given to you today.  If you need a refill on your cardiac medications before your next appointment, please call your pharmacy.   Lab work: None If you have labs (blood work) drawn today and your tests are completely normal, you will receive your results only by: Marland Kitchen MyChart Message (if you have MyChart) OR . A paper copy in the mail If you have any lab test that is abnormal or we need to change your treatment, we will call you to review the results.  Testing/Procedures: None  Follow-Up: At [facility], you and your health needs are our priority.  As part of our continuing mission to provide you with exceptional heart care, we have created designated Provider Care Teams.  These Care Teams include your primary Cardiologist (physician) and Advanced Practice Providers (APPs -  Physician Assistants and Nurse Practitioners) who all work together to provide you with the care you need, when you need it. You will need a follow up appointment in 6 months: Monday, 04/18/2019, at 11:00 am in the [place] office.

## 2018-12-01 ENCOUNTER — Telehealth: Payer: Self-pay | Admitting: Gastroenterology

## 2018-12-01 NOTE — Telephone Encounter (Signed)
Patient was referred over to [place] by [facility] for office visit for recurrent cdiff *3episodes. Patient has not gi hx per facility. Doc of the day 11/30/2018 3:51pm Dr.Cirigliano records will be sent over to doc for review.

## 2018-12-08 NOTE — Telephone Encounter (Signed)
Dr. Bryan Lemma reviewed records and okay to schedule OV.  Called patient and left message to call back to schedule

## 2018-12-30 DIAGNOSIS — M25519 Pain in unspecified shoulder: Secondary | ICD-10-CM | POA: Insufficient documentation

## 2018-12-30 DIAGNOSIS — M799 Soft tissue disorder, unspecified: Secondary | ICD-10-CM | POA: Insufficient documentation

## 2019-03-17 DIAGNOSIS — I361 Nonrheumatic tricuspid (valve) insufficiency: Secondary | ICD-10-CM

## 2019-03-17 DIAGNOSIS — K5090 Crohn's disease, unspecified, without complications: Secondary | ICD-10-CM | POA: Diagnosis not present

## 2019-03-17 DIAGNOSIS — N12 Tubulo-interstitial nephritis, not specified as acute or chronic: Secondary | ICD-10-CM | POA: Diagnosis not present

## 2019-03-17 DIAGNOSIS — A419 Sepsis, unspecified organism: Secondary | ICD-10-CM | POA: Diagnosis not present

## 2019-03-17 DIAGNOSIS — R109 Unspecified abdominal pain: Secondary | ICD-10-CM | POA: Diagnosis not present

## 2019-03-18 DIAGNOSIS — A419 Sepsis, unspecified organism: Secondary | ICD-10-CM | POA: Diagnosis not present

## 2019-03-18 DIAGNOSIS — N12 Tubulo-interstitial nephritis, not specified as acute or chronic: Secondary | ICD-10-CM | POA: Diagnosis not present

## 2019-03-18 DIAGNOSIS — K5090 Crohn's disease, unspecified, without complications: Secondary | ICD-10-CM | POA: Diagnosis not present

## 2019-03-18 DIAGNOSIS — R109 Unspecified abdominal pain: Secondary | ICD-10-CM | POA: Diagnosis not present

## 2019-03-19 DIAGNOSIS — K5090 Crohn's disease, unspecified, without complications: Secondary | ICD-10-CM | POA: Diagnosis not present

## 2019-03-19 DIAGNOSIS — N12 Tubulo-interstitial nephritis, not specified as acute or chronic: Secondary | ICD-10-CM | POA: Diagnosis not present

## 2019-03-19 DIAGNOSIS — A419 Sepsis, unspecified organism: Secondary | ICD-10-CM | POA: Diagnosis not present

## 2019-03-19 DIAGNOSIS — R109 Unspecified abdominal pain: Secondary | ICD-10-CM | POA: Diagnosis not present

## 2019-03-20 DIAGNOSIS — R109 Unspecified abdominal pain: Secondary | ICD-10-CM

## 2019-03-21 ENCOUNTER — Inpatient Hospital Stay (HOSPITAL_COMMUNITY)
Admission: AD | Admit: 2019-03-21 | Discharge: 2019-03-31 | DRG: 871 | Disposition: A | Discharge status: Home Health | Payer: Medicaid Other | Source: Other Acute Inpatient Hospital | Attending: Internal Medicine | Admitting: Internal Medicine

## 2019-03-21 ENCOUNTER — Inpatient Hospital Stay (HOSPITAL_COMMUNITY): Payer: Medicaid Other

## 2019-03-21 DIAGNOSIS — Z79890 Hormone replacement therapy (postmenopausal): Secondary | ICD-10-CM

## 2019-03-21 DIAGNOSIS — M79672 Pain in left foot: Secondary | ICD-10-CM | POA: Diagnosis not present

## 2019-03-21 DIAGNOSIS — K7290 Hepatic failure, unspecified without coma: Secondary | ICD-10-CM | POA: Diagnosis present

## 2019-03-21 DIAGNOSIS — M79602 Pain in left arm: Secondary | ICD-10-CM | POA: Diagnosis not present

## 2019-03-21 DIAGNOSIS — Z825 Family history of asthma and other chronic lower respiratory diseases: Secondary | ICD-10-CM

## 2019-03-21 DIAGNOSIS — G92 Toxic encephalopathy: Secondary | ICD-10-CM | POA: Diagnosis present

## 2019-03-21 DIAGNOSIS — Z886 Allergy status to analgesic agent status: Secondary | ICD-10-CM

## 2019-03-21 DIAGNOSIS — K5090 Crohn's disease, unspecified, without complications: Secondary | ICD-10-CM | POA: Diagnosis present

## 2019-03-21 DIAGNOSIS — J439 Emphysema, unspecified: Secondary | ICD-10-CM | POA: Diagnosis present

## 2019-03-21 DIAGNOSIS — M79601 Pain in right arm: Secondary | ICD-10-CM | POA: Diagnosis not present

## 2019-03-21 DIAGNOSIS — R7881 Bacteremia: Secondary | ICD-10-CM

## 2019-03-21 DIAGNOSIS — E785 Hyperlipidemia, unspecified: Secondary | ICD-10-CM | POA: Diagnosis present

## 2019-03-21 DIAGNOSIS — I10 Essential (primary) hypertension: Secondary | ICD-10-CM | POA: Diagnosis present

## 2019-03-21 DIAGNOSIS — Z90710 Acquired absence of both cervix and uterus: Secondary | ICD-10-CM

## 2019-03-21 DIAGNOSIS — Z8619 Personal history of other infectious and parasitic diseases: Secondary | ICD-10-CM

## 2019-03-21 DIAGNOSIS — I252 Old myocardial infarction: Secondary | ICD-10-CM

## 2019-03-21 DIAGNOSIS — B961 Klebsiella pneumoniae [K. pneumoniae] as the cause of diseases classified elsewhere: Secondary | ICD-10-CM

## 2019-03-21 DIAGNOSIS — F17211 Nicotine dependence, cigarettes, in remission: Secondary | ICD-10-CM | POA: Diagnosis not present

## 2019-03-21 DIAGNOSIS — M4802 Spinal stenosis, cervical region: Secondary | ICD-10-CM | POA: Diagnosis present

## 2019-03-21 DIAGNOSIS — R6520 Severe sepsis without septic shock: Secondary | ICD-10-CM | POA: Diagnosis present

## 2019-03-21 DIAGNOSIS — A4151 Sepsis due to Escherichia coli [E. coli]: Principal | ICD-10-CM | POA: Diagnosis present

## 2019-03-21 DIAGNOSIS — G9340 Encephalopathy, unspecified: Secondary | ICD-10-CM | POA: Diagnosis present

## 2019-03-21 DIAGNOSIS — E039 Hypothyroidism, unspecified: Secondary | ICD-10-CM | POA: Diagnosis present

## 2019-03-21 DIAGNOSIS — B9620 Unspecified Escherichia coli [E. coli] as the cause of diseases classified elsewhere: Secondary | ICD-10-CM | POA: Diagnosis not present

## 2019-03-21 DIAGNOSIS — I2510 Atherosclerotic heart disease of native coronary artery without angina pectoris: Secondary | ICD-10-CM | POA: Diagnosis present

## 2019-03-21 DIAGNOSIS — Z888 Allergy status to other drugs, medicaments and biological substances status: Secondary | ICD-10-CM

## 2019-03-21 DIAGNOSIS — F329 Major depressive disorder, single episode, unspecified: Secondary | ICD-10-CM | POA: Diagnosis present

## 2019-03-21 DIAGNOSIS — M79604 Pain in right leg: Secondary | ICD-10-CM | POA: Diagnosis not present

## 2019-03-21 DIAGNOSIS — N179 Acute kidney failure, unspecified: Secondary | ICD-10-CM | POA: Diagnosis present

## 2019-03-21 DIAGNOSIS — I6501 Occlusion and stenosis of right vertebral artery: Secondary | ICD-10-CM | POA: Diagnosis present

## 2019-03-21 DIAGNOSIS — N390 Urinary tract infection, site not specified: Secondary | ICD-10-CM | POA: Diagnosis not present

## 2019-03-21 DIAGNOSIS — M25562 Pain in left knee: Secondary | ICD-10-CM

## 2019-03-21 DIAGNOSIS — B379 Candidiasis, unspecified: Secondary | ICD-10-CM | POA: Diagnosis not present

## 2019-03-21 DIAGNOSIS — J9811 Atelectasis: Secondary | ICD-10-CM | POA: Diagnosis present

## 2019-03-21 DIAGNOSIS — D72829 Elevated white blood cell count, unspecified: Secondary | ICD-10-CM

## 2019-03-21 DIAGNOSIS — Z8261 Family history of arthritis: Secondary | ICD-10-CM

## 2019-03-21 DIAGNOSIS — Z7982 Long term (current) use of aspirin: Secondary | ICD-10-CM

## 2019-03-21 DIAGNOSIS — J449 Chronic obstructive pulmonary disease, unspecified: Secondary | ICD-10-CM

## 2019-03-21 DIAGNOSIS — Z8709 Personal history of other diseases of the respiratory system: Secondary | ICD-10-CM

## 2019-03-21 DIAGNOSIS — D649 Anemia, unspecified: Secondary | ICD-10-CM | POA: Diagnosis present

## 2019-03-21 DIAGNOSIS — Z882 Allergy status to sulfonamides status: Secondary | ICD-10-CM | POA: Diagnosis not present

## 2019-03-21 DIAGNOSIS — Z9049 Acquired absence of other specified parts of digestive tract: Secondary | ICD-10-CM

## 2019-03-21 DIAGNOSIS — Z91040 Latex allergy status: Secondary | ICD-10-CM

## 2019-03-21 DIAGNOSIS — E876 Hypokalemia: Secondary | ICD-10-CM | POA: Diagnosis present

## 2019-03-21 DIAGNOSIS — K219 Gastro-esophageal reflux disease without esophagitis: Secondary | ICD-10-CM | POA: Diagnosis present

## 2019-03-21 DIAGNOSIS — N10 Acute tubulo-interstitial nephritis: Secondary | ICD-10-CM | POA: Diagnosis present

## 2019-03-21 DIAGNOSIS — R001 Bradycardia, unspecified: Secondary | ICD-10-CM | POA: Diagnosis not present

## 2019-03-21 DIAGNOSIS — M79605 Pain in left leg: Secondary | ICD-10-CM | POA: Diagnosis not present

## 2019-03-21 DIAGNOSIS — Z8701 Personal history of pneumonia (recurrent): Secondary | ICD-10-CM

## 2019-03-21 DIAGNOSIS — Z87891 Personal history of nicotine dependence: Secondary | ICD-10-CM

## 2019-03-21 DIAGNOSIS — K439 Ventral hernia without obstruction or gangrene: Secondary | ICD-10-CM | POA: Diagnosis present

## 2019-03-21 DIAGNOSIS — Z79899 Other long term (current) drug therapy: Secondary | ICD-10-CM

## 2019-03-21 DIAGNOSIS — N12 Tubulo-interstitial nephritis, not specified as acute or chronic: Secondary | ICD-10-CM

## 2019-03-21 DIAGNOSIS — F3289 Other specified depressive episodes: Secondary | ICD-10-CM

## 2019-03-21 DIAGNOSIS — A419 Sepsis, unspecified organism: Secondary | ICD-10-CM | POA: Diagnosis present

## 2019-03-21 DIAGNOSIS — Z7951 Long term (current) use of inhaled steroids: Secondary | ICD-10-CM

## 2019-03-21 DIAGNOSIS — R739 Hyperglycemia, unspecified: Secondary | ICD-10-CM | POA: Diagnosis present

## 2019-03-21 DIAGNOSIS — Z808 Family history of malignant neoplasm of other organs or systems: Secondary | ICD-10-CM

## 2019-03-21 DIAGNOSIS — F419 Anxiety disorder, unspecified: Secondary | ICD-10-CM | POA: Diagnosis present

## 2019-03-21 HISTORY — DX: Atherosclerotic heart disease of native coronary artery without angina pectoris: I25.10

## 2019-03-21 LAB — GLUCOSE, CAPILLARY
Glucose-Capillary: 171 mg/dL — ABNORMAL HIGH (ref 70–99)
Glucose-Capillary: 175 mg/dL — ABNORMAL HIGH (ref 70–99)
Glucose-Capillary: 145 mg/dL — ABNORMAL HIGH (ref 70–99)
Glucose-Capillary: 148 mg/dL — ABNORMAL HIGH (ref 70–99)
Glucose-Capillary: 145 mg/dL — ABNORMAL HIGH (ref 70–99)
Glucose-Capillary: 153 mg/dL — ABNORMAL HIGH (ref 70–99)

## 2019-03-21 LAB — BASIC METABOLIC PANEL
Anion gap: 14 (ref 5–15)
BUN: 33 mg/dL — ABNORMAL HIGH (ref 6–20)
CO2: 21 mmol/L — ABNORMAL LOW (ref 22–32)
Calcium: 9.6 mg/dL (ref 8.9–10.3)
Chloride: 112 mmol/L — ABNORMAL HIGH (ref 98–111)
Creatinine, Ser: 1.26 mg/dL — ABNORMAL HIGH (ref 0.44–1.00)
GFR calc Af Amer: 54 mL/min — ABNORMAL LOW (ref >60)
GFR calc non Af Amer: 47 mL/min — ABNORMAL LOW (ref >60)
Glucose, Bld: 150 mg/dL — ABNORMAL HIGH (ref 70–99)
Potassium: 3.7 mmol/L (ref 3.5–5.1)
Sodium: 147 mmol/L — ABNORMAL HIGH (ref 135–145)

## 2019-03-21 LAB — COMPREHENSIVE METABOLIC PANEL
ALT: 16 U/L (ref 0–44)
AST: 14 U/L — ABNORMAL LOW (ref 15–41)
Albumin: 2.8 g/dL — ABNORMAL LOW (ref 3.5–5.0)
Alkaline Phosphatase: 149 U/L — ABNORMAL HIGH (ref 38–126)
Anion gap: 15 (ref 5–15)
BUN: 33 mg/dL — ABNORMAL HIGH (ref 6–20)
CO2: 22 mmol/L (ref 22–32)
Calcium: 9.5 mg/dL (ref 8.9–10.3)
Chloride: 111 mmol/L (ref 98–111)
Creatinine, Ser: 1.52 mg/dL — ABNORMAL HIGH (ref 0.44–1.00)
GFR calc Af Amer: 43 mL/min — ABNORMAL LOW (ref >60)
GFR calc non Af Amer: 37 mL/min — ABNORMAL LOW (ref >60)
Glucose, Bld: 173 mg/dL — ABNORMAL HIGH (ref 70–99)
Potassium: 2.9 mmol/L — ABNORMAL LOW (ref 3.5–5.1)
Sodium: 148 mmol/L — ABNORMAL HIGH (ref 135–145)
Total Bilirubin: 0.8 mg/dL (ref 0.3–1.2)
Total Protein: 6.1 g/dL — ABNORMAL LOW (ref 6.5–8.1)

## 2019-03-21 LAB — VITAMIN B12: Vitamin B-12: 990 pg/mL — ABNORMAL HIGH (ref 180–914)

## 2019-03-21 LAB — CBC WITH DIFFERENTIAL/PLATELET
Abs Immature Granulocytes: 0.13 10*3/uL — ABNORMAL HIGH (ref 0.00–0.07)
Basophils Absolute: 0 10*3/uL (ref 0.0–0.1)
Basophils Relative: 0 %
Eosinophils Absolute: 0.1 10*3/uL (ref 0.0–0.5)
Eosinophils Relative: 1 %
HCT: 31.4 % — ABNORMAL LOW (ref 36.0–46.0)
Hemoglobin: 9.9 g/dL — ABNORMAL LOW (ref 12.0–15.0)
Immature Granulocytes: 1 %
Lymphocytes Relative: 6 %
Lymphs Abs: 0.6 10*3/uL — ABNORMAL LOW (ref 0.7–4.0)
MCH: 29.8 pg (ref 26.0–34.0)
MCHC: 31.5 g/dL (ref 30.0–36.0)
MCV: 94.6 fL (ref 80.0–100.0)
Monocytes Absolute: 0.3 10*3/uL (ref 0.1–1.0)
Monocytes Relative: 3 %
Neutro Abs: 9.3 10*3/uL — ABNORMAL HIGH (ref 1.7–7.7)
Neutrophils Relative %: 89 %
Platelets: 388 10*3/uL (ref 150–400)
RBC: 3.32 MIL/uL — ABNORMAL LOW (ref 3.87–5.11)
RDW: 14.6 % (ref 11.5–15.5)
WBC: 10.4 10*3/uL (ref 4.0–10.5)
nRBC: 0 % (ref 0.0–0.2)

## 2019-03-21 LAB — URINALYSIS, ROUTINE W REFLEX MICROSCOPIC
Bacteria, UA: NONE SEEN
Bilirubin Urine: NEGATIVE
Glucose, UA: NEGATIVE mg/dL
Ketones, ur: 20 mg/dL — AB
Nitrite: NEGATIVE
Protein, ur: 30 mg/dL — AB
Specific Gravity, Urine: 1.02 (ref 1.005–1.030)
pH: 6 (ref 5.0–8.0)

## 2019-03-21 LAB — LACTIC ACID, PLASMA
Lactic Acid, Venous: 1.5 mmol/L (ref 0.5–1.9)
Lactic Acid, Venous: 1.3 mmol/L (ref 0.5–1.9)

## 2019-03-21 LAB — AMMONIA: Ammonia: 44 umol/L — ABNORMAL HIGH (ref 9–35)

## 2019-03-21 LAB — HEMOGLOBIN A1C
Hgb A1c MFr Bld: 6.5 % — ABNORMAL HIGH (ref 4.8–5.6)
Mean Plasma Glucose: 139.85 mg/dL

## 2019-03-21 LAB — MAGNESIUM: Magnesium: 2 mg/dL (ref 1.7–2.4)

## 2019-03-21 LAB — TSH: TSH: 1.527 u[IU]/mL (ref 0.350–4.500)

## 2019-03-21 MED ORDER — INSULIN ASPART 100 UNIT/ML ~~LOC~~ SOLN
0.0000 [IU] | SUBCUTANEOUS | Status: DC
  Administered 2019-03-21: 1 [IU] via SUBCUTANEOUS
  Administered 2019-03-21: 05:00:00 2 [IU] via SUBCUTANEOUS

## 2019-03-21 MED ORDER — IPRATROPIUM-ALBUTEROL 0.5-2.5 (3) MG/3ML IN SOLN: 3.0000 mL | Freq: Four times a day (QID) | RESPIRATORY_TRACT | Status: DC | PRN

## 2019-03-21 MED ORDER — FUROSEMIDE 10 MG/ML IJ SOLN
40.0000 mg | Freq: Once | INTRAMUSCULAR | Status: AC
  Administered 2019-03-21: 40 mg via INTRAVENOUS
  Filled 2019-03-21: qty 4

## 2019-03-21 MED ORDER — OXYCODONE HCL 5 MG PO TABS
5.0000 mg | ORAL_TABLET | Freq: Four times a day (QID) | ORAL | Status: DC | PRN
  Administered 2019-03-21: 5 mg via ORAL
  Filled 2019-03-21 (×2): qty 1

## 2019-03-21 MED ORDER — THIAMINE HCL 100 MG/ML IJ SOLN
100.0000 mg | Freq: Every day | INTRAMUSCULAR | Status: DC
  Administered 2019-03-21: 100 mg via INTRAVENOUS
  Administered 2019-03-22: 08:00:00 via INTRAVENOUS
  Filled 2019-03-21 (×3): qty 2

## 2019-03-21 MED ORDER — SODIUM CHLORIDE 0.9 % IV SOLN
INTRAVENOUS | Status: DC | PRN
  Administered 2019-03-21: 250 mL via INTRAVENOUS

## 2019-03-21 MED ORDER — AMLODIPINE BESYLATE 5 MG PO TABS
5.0000 mg | ORAL_TABLET | Freq: Every day | ORAL | Status: DC
  Administered 2019-03-21 – 2019-03-31 (×11): 5 mg via ORAL
  Filled 2019-03-21 (×11): qty 1

## 2019-03-21 MED ORDER — UMECLIDINIUM BROMIDE 62.5 MCG/INH IN AEPB
1.0000 | INHALATION_SPRAY | Freq: Every day | RESPIRATORY_TRACT | Status: DC
  Administered 2019-03-22 – 2019-03-31 (×8): 1 via RESPIRATORY_TRACT
  Filled 2019-03-21 (×2): qty 7

## 2019-03-21 MED ORDER — SODIUM CHLORIDE 0.9 % IV SOLN
2.0000 g | INTRAVENOUS | Status: DC
  Administered 2019-03-21: 2 g via INTRAVENOUS
  Filled 2019-03-21: qty 20

## 2019-03-21 MED ORDER — POTASSIUM CHLORIDE 10 MEQ/100ML IV SOLN
10.0000 meq | INTRAVENOUS | Status: AC
  Administered 2019-03-21 (×4): 10 meq via INTRAVENOUS
  Filled 2019-03-21 (×4): qty 100

## 2019-03-21 MED ORDER — HEPARIN SODIUM (PORCINE) 5000 UNIT/ML IJ SOLN
5000.0000 [IU] | Freq: Three times a day (TID) | INTRAMUSCULAR | Status: DC
  Administered 2019-03-21 – 2019-03-31 (×30): 5000 [IU] via SUBCUTANEOUS
  Filled 2019-03-21 (×26): qty 1

## 2019-03-21 MED ORDER — ASPIRIN EC 81 MG PO TBEC
81.0000 mg | DELAYED_RELEASE_TABLET | Freq: Every day | ORAL | Status: DC
  Administered 2019-03-21 – 2019-03-31 (×11): 81 mg via ORAL
  Filled 2019-03-21 (×11): qty 1

## 2019-03-21 MED ORDER — ATORVASTATIN CALCIUM 40 MG PO TABS
40.0000 mg | ORAL_TABLET | Freq: Every day | ORAL | Status: DC
  Administered 2019-03-21 – 2019-03-30 (×9): 40 mg via ORAL
  Filled 2019-03-21 (×8): qty 1
  Filled 2019-03-21: qty 4
  Filled 2019-03-21 (×3): qty 1

## 2019-03-21 MED ORDER — METOPROLOL TARTRATE 12.5 MG HALF TABLET
12.5000 mg | ORAL_TABLET | Freq: Two times a day (BID) | ORAL | Status: DC
  Administered 2019-03-21 – 2019-03-31 (×20): 12.5 mg via ORAL
  Filled 2019-03-21 (×20): qty 1

## 2019-03-21 MED ORDER — OXYCODONE HCL 5 MG PO TABS
5.0000 mg | ORAL_TABLET | ORAL | Status: DC | PRN
  Administered 2019-03-21 – 2019-03-30 (×12): 5 mg via ORAL
  Filled 2019-03-21 (×13): qty 1

## 2019-03-21 MED ORDER — FAMOTIDINE 20 MG PO TABS
20.0000 mg | ORAL_TABLET | Freq: Every day | ORAL | Status: DC
  Administered 2019-03-21 – 2019-03-31 (×11): 20 mg via ORAL
  Filled 2019-03-21 (×11): qty 1

## 2019-03-21 MED ORDER — ESCITALOPRAM OXALATE 10 MG PO TABS
10.0000 mg | ORAL_TABLET | Freq: Every day | ORAL | Status: DC
  Administered 2019-03-21 – 2019-03-31 (×11): 10 mg via ORAL
  Filled 2019-03-21 (×11): qty 1

## 2019-03-21 MED ORDER — INSULIN ASPART 100 UNIT/ML ~~LOC~~ SOLN
0.0000 [IU] | Freq: Three times a day (TID) | SUBCUTANEOUS | Status: DC
  Administered 2019-03-21 (×2): 1 [IU] via SUBCUTANEOUS
  Administered 2019-03-22: 2 [IU] via SUBCUTANEOUS
  Administered 2019-03-22 (×2): 3 [IU] via SUBCUTANEOUS
  Administered 2019-03-23: 1 [IU] via SUBCUTANEOUS
  Administered 2019-03-23: 2 [IU] via SUBCUTANEOUS
  Administered 2019-03-23 – 2019-03-24 (×3): 1 [IU] via SUBCUTANEOUS
  Administered 2019-03-25: 2 [IU] via SUBCUTANEOUS
  Administered 2019-03-28 – 2019-03-30 (×3): 1 [IU] via SUBCUTANEOUS

## 2019-03-21 MED ORDER — BUDESONIDE 0.25 MG/2ML IN SUSP
0.2500 mg | Freq: Two times a day (BID) | RESPIRATORY_TRACT | Status: DC
  Administered 2019-03-21 – 2019-03-31 (×19): 0.25 mg via RESPIRATORY_TRACT
  Filled 2019-03-21 (×21): qty 2

## 2019-03-21 MED ORDER — CEFAZOLIN SODIUM-DEXTROSE 2-4 GM/100ML-% IV SOLN
2.0000 g | Freq: Three times a day (TID) | INTRAVENOUS | Status: DC
  Administered 2019-03-21 – 2019-03-23 (×6): 2 g via INTRAVENOUS
  Filled 2019-03-21 (×8): qty 100

## 2019-03-21 MED ORDER — POTASSIUM CHLORIDE CRYS ER 20 MEQ PO TBCR
20.0000 meq | EXTENDED_RELEASE_TABLET | Freq: Once | ORAL | Status: AC
  Administered 2019-03-21: 20 meq via ORAL
  Filled 2019-03-21: qty 1

## 2019-03-21 MED ORDER — MORPHINE SULFATE (PF) 2 MG/ML IV SOLN
1.0000 mg | Freq: Once | INTRAVENOUS | Status: AC
  Administered 2019-03-21: 1 mg via INTRAVENOUS
  Filled 2019-03-21: qty 1

## 2019-03-21 MED ORDER — FENTANYL CITRATE (PF) 100 MCG/2ML IJ SOLN
25.0000 ug | Freq: Once | INTRAMUSCULAR | Status: AC
  Administered 2019-03-21: 25 ug via INTRAVENOUS
  Filled 2019-03-21: qty 2

## 2019-03-21 MED ORDER — CHLORHEXIDINE GLUCONATE CLOTH 2 % EX PADS
6.0000 | MEDICATED_PAD | Freq: Every day | CUTANEOUS | Status: DC
  Administered 2019-03-21 – 2019-03-31 (×11): 6 via TOPICAL

## 2019-03-21 MED ORDER — TIOTROPIUM BROMIDE MONOHYDRATE 18 MCG IN CAPS
18.0000 ug | ORAL_CAPSULE | Freq: Every day | RESPIRATORY_TRACT | Status: DC
  Filled 2019-03-21: qty 5

## 2019-03-21 NOTE — Progress Notes (Signed)
NAME:  Kathy Lawson, MRN:  [id number], DOB:  [birth year], LOS: 0 ADMISSION DATE:  03/21/2019, CONSULTATION DATE:  03/21/2019 REFERRING MD:  Transfer from [facility], CHIEF COMPLAINT:  Worsening mental status   Brief History   58yo female w/ PMH significant for HTN, HLD, CAD, MI, COPD, asthma, crohns disease, chronic anemia and hypothyroidism who was a transfer from [facility] on 03/21/2019 after presenting there 03/17/2019 w/ pyelonephritis and E-coli bacteremia c/b worsening encephalopathy and concern for meningitis.  [facility] to [facility] for further neurological work up.   Past Medical History  HTN HLD CAD MI COPD Asthma Crohns Disease Chronic Anemia Hypothyroidism Osteoarthritis  Significant Hospital Events   New Admit 03/21/2019  Consults:  Neurology  Procedures:    Significant Diagnostic Tests:  Outside hospital imaging:  --CT abdomen/pelvis 03/17/2019: bilateral pyelonephritis greater on left --Repeat CT abdomen/pelvis 03/19/2019: Showed no abscess or fluid collection  --Abdominal ultrasound 10/31: Increased echogenicity in the liver is nonspecific but often seen with hepatic steatosis --MRI head without contrast 03/19/2019: No acute findings --CTA head 03/19/2019: 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 Unremarkable CTA of the circle of willis with significant proximal stenosis  Micro Data:  Blood cultures +Ecoli at OSH 10/29 Repeat blood cultures at OSH 10/31 NGTD Repeated blood cultures on arrival 11/2 >>>  Antimicrobials:  Rocephin 2g IV Q24hr  Interim history/subjective:    Objective   Blood pressure (!) 149/89, pulse 89, temperature 98.4 F (36.9 C), temperature source Axillary, resp. rate 18, weight 79.6 kg, SpO2 96 %.        Intake/Output Summary (Last 24 hours) at 03/21/2019 0836 Last data filed at 03/21/2019 0800 Gross per 24 hour   Intake 263.54 ml  Output 550 ml  Net -286.46 ml   Filed Weights   03/21/19 0218  Weight: 79.6 kg    Examination: General: Alert and following commands HENT:  AT/[place].  Mucous membranes moist Lungs: crackles at bases.  No distress, no bronchospasm. Symmetric expansion Cardiovascular: SR, Warm and well perfused.  Trace edema to extremities.  Abdomen: Ventral abdominal hernia noted, tender to touch.  Soft.  Extremities: Warm and well perfused Neuro: Alert to self, year, MAE.  No focal deficits  Resolved Hospital Problem list     Assessment & Plan:  Acute encephalopathy, suspect toxic metabolic --Imaging negative to date: CTA Head MRI Brain Plan: --Neurology following and recommend waiting for lab results before proceeding with LP --Continue to hold Cefepime and use alternative abx.  --Empiric thiamine --Delirium prevention measures and encourage appropriate sleep/wake cycle --Appears to be improving this am, alert to year and self. States month is Oct.  Some difficulty remembering events this am and over the last several days.   Ecoli bacteremia with suspected source Pyleonephritis  --Blood CX Cleared 10/31 Plan:  --Follow up repeat cx 11/2 --Continue Rocephin  Elevated liver enzymes, resolved --Abdominal u/s 10/31: suspected hepatic steatosis --LFTs normalized   Acute renal failure --Cr 2.0 on arrival to outside hospital, unknown if this is baseline Plan: --Improving, creatinine down to 1.52.   --Continue supportive care and avoid nephrotoxins  Ventral abdominal hernia --bowel w/no strangulation or ischemic changes on imaging --Seen by general surgery at [facility] who recommended no immediate surgical intervention --Will need surgical follow up for hernia.   Acute respiratory insufficiency requiring 3 L [place]  --No bronchospasm on exam --CXR with right atelectasis.   Plan: --Encourage pulmonary hygiene  with mobilization and IS/Flutter as tolerated.  Hx  COPD/asthma --PRN Duoneb --Continue home inhaler: Symbicort, Spiriva  Hx CAD/MI Hx Hyperlipidemia Hx Hypertension --Last stress test 2010, negative for ischemic changes Plan: --Continue Statin --ASA & Metoprolol resumed  Hx GERD Hx Crohns disease --Followed by Dr.Butler outpatient --PPI  Hx Depression Plan: --Home meds: Lexapro, continued  Hypothyroidism --Does not take medication Plan: --TSH normal, no interventions at this time.   Hypokalemia Plan --Receiving replacement. --Repeat BMP 1100  Hyperglycemia --HgbA1c: 6.5 Plan: --Continue SSI  Best practice:  Diet: Diet Pain/Anxiety/Delirium protocol (if indicated): N/A VAP protocol (if indicated): N/A DVT prophylaxis: Heparin GI prophylaxis: Pepcid Glucose control: SSI Mobility: Out of bed w/assistance. Physical therapy consult  Code Status: Full Family Communication: Son, Ozzie Hoyle.  Attempted to call, no answer. Message left.  Disposition: Transfer out of ICU.    Labs   CBC: Recent Labs  Lab 03/21/19 0227  WBC 10.4  NEUTROABS 9.3*  HGB 9.9*  HCT 31.4*  MCV 94.6  PLT 425    Basic Metabolic Panel: Recent Labs  Lab 03/21/19 0227  NA 148*  K 2.9*  CL 111  CO2 22  GLUCOSE 173*  BUN 33*  CREATININE 1.52*  CALCIUM 9.5  MG 2.0   GFR: Estimated Creatinine Clearance: 38.9 mL/min (A) (by C-G formula based on SCr of 1.52 mg/dL (H)). Recent Labs  Lab 03/21/19 0227 03/21/19 0541  WBC 10.4  --   LATICACIDVEN 1.5 1.3    Liver Function Tests: Recent Labs  Lab 03/21/19 0227  AST 14*  ALT 16  ALKPHOS 149*  BILITOT 0.8  PROT 6.1*  ALBUMIN 2.8*   No results for input(s): LIPASE, AMYLASE in the last 168 hours. Recent Labs  Lab 03/21/19 0227  AMMONIA 44*    ABG No results found for: PHART, PCO2ART, PO2ART, HCO3, TCO2, ACIDBASEDEF, O2SAT   Coagulation Profile: No results for input(s): INR, PROTIME in the last 168 hours.  Cardiac Enzymes: No results for input(s): CKTOTAL, CKMB,  CKMBINDEX, TROPONINI in the last 168 hours.  HbA1C: Hgb A1c MFr Bld  Date/Time Value Ref Range Status  03/21/2019 04:42 AM 6.5 (H) 4.8 - 5.6 % Final    Comment:    (NOTE) Pre diabetes:          5.7%-6.4% Diabetes:              >6.4% Glycemic control for   <7.0% adults with diabetes     CBG: Recent Labs  Lab 03/21/19 0104 03/21/19 0448 03/21/19 0735  GLUCAP 171* 175* Park Layne, ACNP  Pulmonary & Critical Care  Pager: [phone]

## 2019-03-21 NOTE — H&P (Signed)
NAME:  Kathy Lawson, MRN:  [id number], DOB:  12/01/1960, LOS: 0 ADMISSION DATE:  03/21/2019, CONSULTATION DATE:  03/21/2019 REFERRING MD:  Transfer from [facility], CHIEF COMPLAINT:  Worsening mental status   Brief History   58yo female w/ PMH significant for HTN, HLD, CAD, MI, COPD, asthma, crohns disease, chronic anemia and hypothyroidism who was a transfer from [facility] on 03/21/2019 after presenting there 03/17/2019 w/ pyelonephritis and E-coli bacteremia c/b worsening encephalopathy and concern for meningitis  History of present illness   58yo female w/PMH significant for HTN, HLD, CAD, MI, COPD, asthma, crohn's disease, chronic anemia, osteoarthritis and hypothyroidism who origionally presented to [facility] on 03/17/2019 w/complaints of abdominal and back pain that had progressed over the past month and her ED workup revealed severe sepsis 2/2 UTI, pyelonephritis. She was admitted to the ICU at [facility] and received cefepime, flagyl and vancomycin. On 03/18/2019 patient had an acute neurological change and had an MRIB, CTH, CTA abdomen/pelvis all negative for acute abnormalities. Blood and urine cultures were positive for E-coli and patient was transitioned to Rocephin on 11/1. Hospital course was further complicated by continued worsening acute encephalopathy raising concern for possible meningitis. A tele neuro consult was done and recommendation was to transfer to [facility] for possible LP and further neurological workup.   Upon arrival to the medical ICU at [facility], patient was seen and examined. She is alert but only able to answer questions to name and place. She did weakly follow commands x4. Patient complains of pain and chills. Febrile on arrival at 57 ax. VSS. Kernig's and Brudzinki's sign negative.   Outside hospital records reviewed. Significant lab values include: WBC 16 (down from 29), Cr 1.5 (down from 2.0), elevated AST  Past Medical History   HTN HLD CAD MI COPD Asthma Crohns Disease Chronic Anemia Hypothyroidism Osteoarthritis  Significant Hospital Events   New Admit 03/21/2019  Consults:  Neurology  Procedures:    Significant Diagnostic Tests:  Outside hospital imaging:  --CT abdomen/pelvis 03/17/2019: bilateral pyelonephritis greater on left --Repeat CT abdomen/pelvis 03/19/2019: Showed no abscess or fluid collection  --Abdominal ultrasound 10/31: Increased echogenicity in the liver is nonspecific but often seen with hepatic steatosis --MRI head without contrast 03/19/2019: No acute findings --CTA head 03/19/2019: 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 Unremarkable CTA of the circle of willis with significant proximal stenosis  Micro Data:  Blood cultures +Ecoli at OSH 10/29 Repeat blood cultures at OSH 10/31 NGTD Repeated blood cultures on arrival 11/2 >>>  Antimicrobials:  Rocephin 2g IV Q24hr  Interim history/subjective:    Objective   Blood pressure (!) 149/136, pulse (!) 102, temperature 99.8 F (37.7 C), temperature source Axillary, resp. rate (!) 21, weight 79.6 kg, SpO2 96 %.        Intake/Output Summary (Last 24 hours) at 03/21/2019 0258 Last data filed at 03/21/2019 0100 Gross per 24 hour  Intake -  Output 175 ml  Net -175 ml   Filed Weights   03/21/19 0218  Weight: 79.6 kg    Examination: General: ill appearing, mild distress HENT: Pupils equal and reactive, EOMs intact Lungs: lung sounds diminished throughout, equal bilateral, 2L [place] Cardiovascular: normal sinus rhythm, no edema, heart sounds S1, S2 Abdomen: Large hernia Extremities: warm, dry, no rash Neuro: Alert, slow to respond, confused, weakly follows commands x4 3/5 strength GU: Foley, cloudy yellow urine  Resolved Hospital Problem list  Assessment & Plan:  Acute encephalopathy --Etiology unclear but low suspicion  for meningitis and suspect metabolic  --Extensive outside hospital imaging done including CTA head, MRI brain which were negative for acute findings --Ammonia level pending --Neurology following and recommend waiting for lab results before proceeding with LP --Monitor neuro exams closely --Has been receiving Flagyl, vancomycin and cefepime at OSH and was switched to Rocephin 11/1 --Continue Rocephin 2g IV q24hr for now  Pyelonephritis Ecoli bacteremia --Continue Rocephin as mentioned above --Repeat blood cultures pending --OSH BC's from 10/31 negative to date --CT abd/pelvis 10/31 negative for abscess or fluid collection  Transaminitis --Elevated liver enzymes at OSH --CMP pending --Ammonia level pending --Abdominal ultrasound 10/31: Increased echogenicity in the liver is nonspecific but often seen with hepatic steatosis  Acute renal failure --Cr 2.0 on arrival to outside hospital --Received IVF resuscitation --Cr improved to 1.4 --Avoid nephrotoxins --Continue to monitor  Ventral abdominal hernia --bowel w/no strangulation or ischemic changes on imaging --Seen by general surgery at [facility] who recommended no immediate surgical intervention  Hx COPD/asthma --PRN Duoneb --Continue home inhaler: Symbicort  Hx CAD/MI Hx Hyperlipidemia Hx Hypertension --Last stress test 2010, negative for ischemic changes --Continue statin, holding BP meds for now  Hx GERD Hx Crohns disease --Followed by Dr.Butler outpatient --Continue PPI  Hx Depression --Continue home lexapro  Hypothyroidism --Does not take medication --Check TSH  Best practice:  Diet: NPO for now  Pain/Anxiety/Delirium protocol (if indicated): N/A VAP protocol (if indicated): N/A DVT prophylaxis: Heparin GI prophylaxis: Pepcid Glucose control: SSI Mobility: Out of bed w/assistance Code Status: Full Family Communication: No family present Disposition: To remain in ICU  Labs   CBC: No results  for input(s): WBC, NEUTROABS, HGB, HCT, MCV, PLT in the last 168 hours.  Basic Metabolic Panel: No results for input(s): NA, K, CL, CO2, GLUCOSE, BUN, CREATININE, CALCIUM, MG, PHOS in the last 168 hours. GFR: CrCl cannot be calculated (Patient's most recent lab result is older than the maximum 21 days allowed.). No results for input(s): PROCALCITON, WBC, LATICACIDVEN in the last 168 hours.  Liver Function Tests: No results for input(s): AST, ALT, ALKPHOS, BILITOT, PROT, ALBUMIN in the last 168 hours. No results for input(s): LIPASE, AMYLASE in the last 168 hours. No results for input(s): AMMONIA in the last 168 hours.  ABG No results found for: PHART, PCO2ART, PO2ART, HCO3, TCO2, ACIDBASEDEF, O2SAT   Coagulation Profile: No results for input(s): INR, PROTIME in the last 168 hours.  Cardiac Enzymes: No results for input(s): CKTOTAL, CKMB, CKMBINDEX, TROPONINI in the last 168 hours.  HbA1C: No results found for: HGBA1C  CBG: Recent Labs  Lab 03/21/19 0104  GLUCAP 171*    Review of Systems:   Unable to assess due to clinical condition  Past Medical History  She,  has a past medical history of C. difficile colitis (08/2018), Emphysema/COPD ([place]), Hyperlipemia, Hypertension, and Vertigo.   Surgical History    Past Surgical History:  Procedure Laterality Date  . ABDOMINAL HYSTERECTOMY    . CHOLECYSTECTOMY  1980s  . HERNIA REPAIR    . hysterectomy  1999  . TUBAL LIGATION  1997     Social History   reports that she quit smoking about 8 months ago. Her smoking use included cigarettes. She started smoking about 44 years ago. She smoked 1.50 packs per day. She has never used smokeless tobacco. She reports current alcohol use. She reports that she does not use drugs.   Family History   Her  family history includes Allergies in her sister; Emphysema in her father; Rheum arthritis in her mother; Skin cancer in her mother.   Allergies Allergies  Allergen Reactions  . Lasix  [Furosemide]     Causes hyponatremia   . Meloxicam   . Other     Sulfonamide (substance)   . Latex Rash     Home Medications  Prior to Admission medications   Medication Sig Start Date End Date Taking? Authorizing Provider  acetaminophen (TYLENOL) 500 MG tablet Take 1,000 mg by mouth every 6 (six) hours as needed for mild pain or headache.    [provider]  albuterol (PROVENTIL HFA;VENTOLIN HFA) 108 (90 BASE) MCG/ACT inhaler Inhale 2 puffs into the lungs. Every 2-3 hours as needed    [provider]  albuterol (PROVENTIL) (2.5 MG/3ML) 0.083% nebulizer solution Take 2.5 mg by nebulization every 6 (six) hours as needed for wheezing or shortness of breath.    [provider]  aspirin 81 MG tablet Take 81 mg by mouth daily.    [provider]  atorvastatin (LIPITOR) 10 MG tablet Take 10 mg by mouth daily.    [provider]  budesonide-formoterol (SYMBICORT) 160-4.5 MCG/ACT inhaler Inhale 2 puffs into the lungs 2 (two) times daily. 10/04/13   Elsie Stain, MD  Calcium Carbonate-Vit D-Min (CALCIUM 1200 PO) Take by mouth daily.    [provider]  calcium-vitamin D (OSCAL) 250-125 MG-UNIT per tablet Take 1 tablet by mouth 2 (two) times daily.    [provider]  cetirizine (ZYRTEC) 10 MG tablet Take 10 mg by mouth daily.    [provider]  Cholecalciferol (VITAMIN D PO) Take 1 tablet by mouth daily.     [provider]  diphenoxylate-atropine (LOMOTIL) 2.5-0.025 MG tablet Take 2 tablets by mouth as needed for diarrhea or loose stools.    [provider]  escitalopram (LEXAPRO) 10 MG tablet Take 10 mg by mouth daily.    [provider]  furosemide (LASIX) 40 MG tablet Take 1 tablet (40 mg total) by mouth daily. 09/06/18   Thurnell Lose, MD  HYDROcodone-acetaminophen (NORCO/VICODIN) 5-325 MG tablet Take 1 tablet by mouth every 8 (eight) hours as needed for moderate pain. 09/06/18   Thurnell Lose, MD  Melatonin 5 MG TABS Take 1 tablet by mouth at bedtime.    [provider]  metoprolol succinate (TOPROL-XL) 25 MG 24 hr tablet Take 25 mg by mouth daily.    [provider]  mometasone (NASONEX) 50 MCG/ACT nasal spray Place 2 sprays into the nose 2 (two) times daily.     [provider]  montelukast (SINGULAIR) 10 MG tablet Take 10 mg by mouth at bedtime.    [provider]  tiotropium (SPIRIVA) 18 MCG inhalation capsule Place 18 mcg into inhaler and inhale daily.    [provider]  vancomycin (VANCOCIN) 50 mg/mL oral solution Take 500 mg 4 times a day for 1 week, then to 250 mg 4 times a day for a week, then 125 mg 4 times a week for 1 week and stop. 09/06/18   Thurnell Lose, MD  Vancomycin HCl [facility]) 50 MG/ML SOLR Take 500 mg 4 times a day for 1 week, then to 250 mg 4 times a day for a week, then 125 mg 4 times a week for 1 week and stop. 09/06/18   [provider]     Critical care time: 51 minutes

## 2019-03-21 NOTE — Progress Notes (Signed)
[place] screening tool perform at bedside at 1200 pt failed. Pt is confused, cough after 1 sip. [place] screening tool performed again at bedside at 1700 pt's confusion comes and goes, pt passed [place] screening tool. Pt able to take PO's without any problems. Will continue to monitor.

## 2019-03-21 NOTE — Progress Notes (Signed)
[facility] Progress Note Patient Name: Kathy Lawson DOB: [birth year] MRN: [id number]   Date of Service  03/21/2019  HPI/Events of Note  Patient c/o head and L foot pain.   eICU Interventions  Will increase Oxycodone 5 mg PO from Q 6 hours PRN to Q 4 hours PRN.      Intervention Category Major Interventions: Other:  Lysle Dingwall 03/21/2019, 8:34 PM

## 2019-03-21 NOTE — Consult Note (Signed)
[facility] for Infectious Disease       Reason for Consult: pyelonephritis    Referring Physician: [facility]CM/[facility] hospitalist  Active Problems:   Sepsis ([place])   Acute encephalopathy   Normocytic anemia   Hypomagnesemia   UTI (urinary tract infection)   . aspirin EC  81 mg Oral Daily  . atorvastatin  40 mg Oral q1800  . budesonide (PULMICORT) nebulizer solution  0.25 mg Nebulization BID  . escitalopram  10 mg Oral Daily  . famotidine  20 mg Oral Daily  . heparin  5,000 Units Subcutaneous Q8H  . insulin aspart  0-9 Units Subcutaneous TID WC  . metoprolol tartrate  12.5 mg Oral BID  . thiamine injection  100 mg Intravenous Daily  . tiotropium  18 mcg Inhalation Daily    Recommendations: Cefazolin Will stop ceftriaxone   Assessment: She has bilateral pyelonephritis with positive urine cultures, bacteremia and CT findings of bilateral pyelo.  Sensitivities noted and will transition to IV cefazolin.     Antibiotics: ceftriaxone  HPI: Kathy Lawson is a 58 y.o. female with HTN, COPD, Crohn's disease initially admitted to [facility] with above.  Also notable encephalopathy leading to transfer to ICU.  Now more alert.  Improved overall but states she feels 'awful'.  WBC 10.4, afebrile here.  Iniially placed on broad coverage at [facility] since 10/29.  Also concern for meningitis but no neck pain.  Urine cultures with E coli and Klebsiella, blood culture with E coli.  CT with above, no abscess.     Review of Systems:  Constitutional: negative for fevers and chills Gastrointestinal: negative for nausea and diarrhea Integument/breast: negative for rash All other systems reviewed and are negative    Past Medical History:  Diagnosis Date  . C. difficile colitis 08/2018  . Emphysema/COPD ([place])   . Hyperlipemia   . Hypertension   . Vertigo     Social History   Tobacco Use  . Smoking status: Former Smoker    Packs/day: 1.50    Types: Cigarettes    Start date:  05/19/1974    Quit date: 07/01/2018    Years since quitting: 0.7  . Smokeless tobacco: Never Used  Substance Use Topics  . Alcohol use: Yes    Comment: 5-6 beers couple times weekly  . Drug use: No    Family History  Problem Relation Age of Onset  . Emphysema Father   . Allergies Sister   . Rheum arthritis Mother   . Skin cancer Mother     Allergies  Allergen Reactions  . Lasix [Furosemide]     Causes hyponatremia   . Meloxicam   . Other     Sulfonamide (substance)   . Latex Rash    Physical Exam: Constitutional: in no apparent distress  Vitals:   03/21/19 0740 03/21/19 0800  BP:    Pulse:  89  Resp:  18  Temp: 98.4 F (36.9 C)   SpO2:     EYES: anicteric Cardiovascular: Cor RRR Respiratory: CTA B; normal respiratory effort GI: some mild diffuse tenderness, no rebound or guarding, soft Musculoskeletal: no edema Skin: negatives: no rash Hematologic: no cervical lad Neuro: alert, oriented but somewhat confused  Lab Results  Component Value Date   WBC 10.4 03/21/2019   HGB 9.9 (L) 03/21/2019   HCT 31.4 (L) 03/21/2019   MCV 94.6 03/21/2019   PLT 388 03/21/2019    Lab Results  Component Value Date   CREATININE 1.52 (H) 03/21/2019  BUN 33 (H) 03/21/2019   NA 148 (H) 03/21/2019   K 2.9 (L) 03/21/2019   CL 111 03/21/2019   CO2 22 03/21/2019    Lab Results  Component Value Date   ALT 16 03/21/2019   AST 14 (L) 03/21/2019   ALKPHOS 149 (H) 03/21/2019     Microbiology: Recent Results (from the past 240 hour(s))  Culture, blood (routine x 2)     Status: None (Preliminary result)   Collection Time: 03/21/19  2:26 AM   Specimen: BLOOD  Result Value Ref Range Status   Specimen Description BLOOD RIGHT ANTECUBITAL  Final   Special Requests   Final    BOTTLES DRAWN AEROBIC AND ANAEROBIC Blood Culture adequate volume   Culture   Final    NO GROWTH < 12 HOURS Performed at [facility], [place] [address], [place], [place] [address]    Report Status  PENDING  Incomplete  Culture, blood (routine x 2)     Status: None (Preliminary result)   Collection Time: 03/21/19  2:27 AM   Specimen: BLOOD LEFT HAND  Result Value Ref Range Status   Specimen Description BLOOD LEFT HAND  Final   Special Requests   Final    BOTTLES DRAWN AEROBIC ONLY Blood Culture adequate volume   Culture   Final    NO GROWTH < 12 HOURS Performed at [facility], [place] [address], [place], [place] [address]    Report Status PENDING  Incomplete    Thayer Headings, [facility] Infectious Disease [place] [email or web address] 03/21/2019, 10:06 AM

## 2019-03-21 NOTE — Progress Notes (Signed)
Same day progress note  Seen and examined in ICU. Awake, alert, oriented x2. Following commands. Non focal generalized weakness. Poor attention and concentration No gross CN deficits +asterixis   Likely toxic metabiolic encephalopathy. Recs as per the AM consult. We will follow.  -- Amie Portland, MD [facility] Pager: [phone] If 7pm to 7am, please call on call as listed on AMION.

## 2019-03-21 NOTE — Progress Notes (Signed)
[facility] Progress Note Patient Name: Kathy Lawson DOB: 1961/02/16 MRN: [id number]   Date of Service  03/21/2019  HPI/Events of Note  K+ = 2.9 and Creatinine = 1.52.   eICU Interventions  Will order: 1. Replace K+. 2. Repeat BMP at 12 noon.     Intervention Category Major Interventions: Electrolyte abnormality - evaluation and management  Royanne Warshaw [place] 03/21/2019, 5:23 AM

## 2019-03-21 NOTE — Consult Note (Signed)
Requesting Physician: Dr. Carson Myrtle    Chief Complaint: Encephalopathy   History obtained from: Patient and Chart    HPI:                                                                                                                                       Kathy Lawson is a 58 y.o. female with past medical history significant for hypertension, hyperlipidemia, COPD, prolonged hospitalization for pneumonia complicated by C. difficile infection in April 2020 transferred from [facility] for persistent encephalopathy with severe sepsis secondary to pyelonephritis  Patient admitted at [facility] on 10/29 due to abdominal pain and back pain and noted to have pyelonephritis with severe sepsis. Blood and urine cultures positive for E. coli  She was treated with cefepime Flagyl and vancomycin.  Patient developed acute neurological change and CT head and MRI brain was performed at [facility] which was negative for acute findings.  Due to persistent and worsening encephalopathy patient a telemetry neurology consult was done who recommended lumbar puncture and patient was transferred to [facility] for further neurological work-up.  Patient on assessment appears to be lethargic, answers her name and follow simple commands.  Has significant asterixis on exam.  Continues to be febrile.  Continues to have leukocytosis, acute kidney injury, elevated AST.  Ammonia pending.    Past Medical History:  Diagnosis Date  . C. difficile colitis 08/2018  . Emphysema/COPD ([place])   . Hyperlipemia   . Hypertension   . Vertigo     Past Surgical History:  Procedure Laterality Date  . ABDOMINAL HYSTERECTOMY    . CHOLECYSTECTOMY  1980s  . HERNIA REPAIR    . hysterectomy  1999  . TUBAL LIGATION  1997    Family History  Problem Relation Age of Onset  . Emphysema Father   . Allergies Sister   . Rheum arthritis Mother   . Skin cancer Mother    Social History:  reports that she quit smoking about  8 months ago. Her smoking use included cigarettes. She started smoking about 44 years ago. She smoked 1.50 packs per day. She has never used smokeless tobacco. She reports current alcohol use. She reports that she does not use drugs.  Allergies:  Allergies  Allergen Reactions  . Lasix [Furosemide]     Causes hyponatremia   . Meloxicam   . Other     Sulfonamide (substance)   . Latex Rash    Medications:  I reviewed home medications   ROS:                                                                                                                                     14 systems reviewed and negative except above   Examination:                                                                                                      General: Appears well-developed and well-nourished.  Psych: Affect appropriate to situation Eyes: No scleral injection HENT: No OP obstrucion Head: Normocephalic.  Cardiovascular: Normal rate and regular rhythm.  Respiratory: Effort normal and breath sounds normal to anterior ascultation GI: Soft.  No distension. There is no tenderness.  Skin: WDI    Neurological Examination Mental Status: Awake but drowsy, states her name and follow simple commands.  Appears very apathetic. Cranial Nerves: II: Visual fields grossly normal,  III,IV, VI: ptosis not present, extra-ocular motions intact bilaterally, pupils equal, round, reactive to light and accommodation VII: smile symmetric, facial light touch sensation normal bilaterally XI: Unable to assess due to mental status XII: midline tongue extension Motor: Right : Upper extremity   3/5    Left:     Upper extremity   3/5  Lower extremity   3/5     Lower extremity   3/5 Tone and bulk:normal tone throughout; no atrophy noted, asterixis noted Sensory: Withdraws to pain bilaterally Deep Tendon  Reflexes: 2+ and symmetric throughout , no clonus Plantars: Right: downgoing   Left: downgoing Cerebellar: normal finger-to-nose, normal rapid alternating movements and normal heel-to-shin test Gait: normal gait and station     Lab Results: Basic Metabolic Panel: No results for input(s): NA, K, CL, CO2, GLUCOSE, BUN, CREATININE, CALCIUM, MG, PHOS in the last 168 hours.  CBC: No results for input(s): WBC, NEUTROABS, HGB, HCT, MCV, PLT in the last 168 hours.  Coagulation Studies: No results for input(s): LABPROT, INR in the last 72 hours.  Imaging: Dg Chest Port 1 View  Result Date: 03/21/2019 CLINICAL DATA:  Tachypnea EXAM: PORTABLE CHEST 1 VIEW COMPARISON:  03/18/2019 FINDINGS: Heart is normal size. Bibasilar opacities, likely atelectasis. No visible effusions or edema. No acute bony abnormality. IMPRESSION: Bibasilar opacities, likely atelectasis. Electronically Signed   By: Rolm Baptise M.D.   On: 03/21/2019 01:48     I have reviewed the above imaging    ASSESSMENT AND PLAN   58 y.o. female with past medical history significant for hypertension,  hyperlipidemia, COPD, prolonged hospitalization for pneumonia complicated by C. difficile infection in April 2020 transferred from [facility] for persistent encephalopathy with severe sepsis secondary to pyelonephritis. On exam she has significant asterixis, no lateralizing deficits and no neck rigidity.  Have a low suspicion for meningitis and patient already on antibiotics for several days and you will do LP will be likely low.  Recommend switching cefepime to alternative antibiotic.  Ammonia level also slightly elevated at 44.   Acute toxic metabolic encephalopathy -Differential diagnosis includes cefepime induced encephalopathy, secondary to gram-negative sepsis. -Routine EEG in the morning , low suspicion of nonconvulsive status epilepticus -Empiric thiamine - B1, B12 levels  -Neurochecks    Britta Louth [facility] Pager Number [phone]

## 2019-03-21 NOTE — Progress Notes (Signed)
[facility] Progress Note Patient Name: Kathy Lawson DOB: 07-18-1960 MRN: [id number]   Date of Service  03/21/2019  HPI/Events of Note  Patient c/o generalized pain.   eICU Interventions  Will order: 1. Fentanyl 25 mcg IV X 1 now.      Intervention Category Major Interventions: Other:  Lysle Dingwall 03/21/2019, 4:37 AM

## 2019-03-22 ENCOUNTER — Inpatient Hospital Stay (HOSPITAL_COMMUNITY): Payer: Medicaid Other

## 2019-03-22 DIAGNOSIS — N179 Acute kidney failure, unspecified: Secondary | ICD-10-CM

## 2019-03-22 LAB — BASIC METABOLIC PANEL
Anion gap: 14 (ref 5–15)
BUN: 36 mg/dL — ABNORMAL HIGH (ref 6–20)
CO2: 25 mmol/L (ref 22–32)
Calcium: 10 mg/dL (ref 8.9–10.3)
Chloride: 111 mmol/L (ref 98–111)
Creatinine, Ser: 1.13 mg/dL — ABNORMAL HIGH (ref 0.44–1.00)
GFR calc Af Amer: >60 mL/min (ref >60)
GFR calc non Af Amer: 53 mL/min — ABNORMAL LOW (ref >60)
Glucose, Bld: 174 mg/dL — ABNORMAL HIGH (ref 70–99)
Potassium: 2.8 mmol/L — ABNORMAL LOW (ref 3.5–5.1)
Sodium: 150 mmol/L — ABNORMAL HIGH (ref 135–145)

## 2019-03-22 LAB — GLUCOSE, CAPILLARY
Glucose-Capillary: 208 mg/dL — ABNORMAL HIGH (ref 70–99)
Glucose-Capillary: 217 mg/dL — ABNORMAL HIGH (ref 70–99)
Glucose-Capillary: 152 mg/dL — ABNORMAL HIGH (ref 70–99)
Glucose-Capillary: 144 mg/dL — ABNORMAL HIGH (ref 70–99)

## 2019-03-22 LAB — PHOSPHORUS: Phosphorus: 2 mg/dL — ABNORMAL LOW (ref 2.5–4.6)

## 2019-03-22 LAB — MAGNESIUM: Magnesium: 2 mg/dL (ref 1.7–2.4)

## 2019-03-22 LAB — MRSA PCR SCREENING: MRSA by PCR: POSITIVE — AB

## 2019-03-22 MED ORDER — PANTOPRAZOLE SODIUM 40 MG IV SOLR
40.0000 mg | INTRAVENOUS | Status: DC
  Administered 2019-03-22: 40 mg via INTRAVENOUS
  Filled 2019-03-22 (×2): qty 40

## 2019-03-22 MED ORDER — WHITE PETROLATUM EX OINT
TOPICAL_OINTMENT | CUTANEOUS | Status: AC
  Administered 2019-03-22: 06:00:00
  Filled 2019-03-22: qty 28.35

## 2019-03-22 MED ORDER — WHITE PETROLATUM EX OINT: TOPICAL_OINTMENT | CUTANEOUS | Status: DC | PRN

## 2019-03-22 MED ORDER — POTASSIUM PHOSPHATES 15 MMOLE/5ML IV SOLN
30.0000 mmol | Freq: Once | INTRAVENOUS | Status: AC
  Administered 2019-03-22: 30 mmol via INTRAVENOUS
  Filled 2019-03-22: qty 10

## 2019-03-22 MED ORDER — ONDANSETRON HCL 4 MG/2ML IJ SOLN
4.0000 mg | Freq: Three times a day (TID) | INTRAMUSCULAR | Status: DC | PRN
  Administered 2019-03-22: 4 mg via INTRAVENOUS
  Filled 2019-03-22: qty 2

## 2019-03-22 MED ORDER — MUPIROCIN 2 % EX OINT
1.0000 "application " | TOPICAL_OINTMENT | Freq: Two times a day (BID) | CUTANEOUS | Status: AC
  Administered 2019-03-22 – 2019-03-26 (×10): 1 via NASAL
  Filled 2019-03-22 (×3): qty 22

## 2019-03-22 NOTE — Progress Notes (Signed)
Transfer Note:   Traveling Method:Stretcher   Transferring Unit: 82M  Mental Orientation: Alert and Oriented Telemetry: yes  Assessment:  Skin:  IV: SL Pain: None Tubes: None Safety Measures: Safety Fall Prevention Plan has been given, discussed and signed Admission: Completed 11M Orientation: Patient has been orientated to the room, unit and staff.  Family: none   Orders have been reviewed and implemented. Will continue to monitor the patient. Call light has been placed within reach and bed alarm has been activated.  Shela Commons, RN

## 2019-03-22 NOTE — Procedures (Signed)
Patient Name: Kathy Lawson  MRN: [id number]  Epilepsy Attending: Lora Havens  Referring Physician/Provider: Dr. Amie Portland Date: 03/22/2019 Duration: 25.06 minutes  Patient history: 58 year old female with altered mental status.  EEG to evaluate for seizure.  Level of alertness: Awake  AEDs during EEG study: None  Technical aspects: This EEG study was done with scalp electrodes positioned according to the 10-20 International system of electrode placement. Electrical activity was acquired at a sampling rate of 500Hz  and reviewed with a high frequency filter of 70Hz  and a low frequency filter of 1Hz . EEG data were recorded continuously and digitally stored.   Description: During awake state, no clear posterior dominant rhythm was seen.  EEG showed continuous generalized 2 to 5 Hz theta-delta slowing as well as intermittent generalized 2 to 3 Hz delta slowing.  At times triphasic waves, generalized, maximal bifrontal were also seen.  Hyperventilation and photic stimulation were not performed.  Abnormality -Continuous slow, generalized -Intermittent rhythmic slow, generalized -Triphasic waves, generalized, maximal bifrontal  IMPRESSION: This study is suggestive of moderate diffuse encephalopathy, possibly secondary to toxic multiple metabolic etiology. No seizures or epileptiform discharges were seen throughout the recording.

## 2019-03-22 NOTE — Evaluation (Signed)
Physical Therapy Evaluation Patient Details Name: Kathy Lawson MRN: [id number] DOB: July 24, 1960 Today's Date: 03/22/2019   History of Present Illness  58yo female w/ PMH significant for HTN, HLD, CAD, MI, COPD, asthma, crohns disease, chronic anemia and hypothyroidism who was a transfer from [facility] on 03/21/2019 after presenting there 03/17/2019 w/ pyelonephritis and E-coli bacteremia c/b worsening encephalopathy and concern for meningitis  Clinical Impression  Pt with very delayed response to questions but able to report she was living with son and daughter in-law in 2 story home with 5 steps to enter. Pt bed and bath on the first floor. Pt reports she was able to independently ambulate in her home with RW and that she was able to do her own bathing with supervision. Family provides for iADLs. Pt is currently limited in safe mobility by poor safety awareness and slow processing in presence of decreased strength and mobility. Pt requires maxAx2 to sit EoB where she was able to static sit for 3 minutes. Pt unable to achieve sit>stand. Pt requires total A to return to bed. PT recommending SNF level rehab at discharge. PT will continue to follow acutely.   Follow Up Recommendations SNF    Equipment Recommendations  Other (comment)(TBD at next venue)    Recommendations for Other Services OT consult     Precautions / Restrictions Precautions Precautions: Fall Precaution Comments: reports falls at home Restrictions Weight Bearing Restrictions: No      Mobility  Bed Mobility Overal bed mobility: Needs Assistance Bed Mobility: Supine to Sit;Sit to Supine     Supine to sit: Max assist;HOB elevated Sit to supine: Total assist;+2 for physical assistance      Transfers Overall transfer level: Needs assistance Equipment used: 1 person hand held assist Transfers: Sit to/from Stand Sit to Stand: Total assist         General transfer comment: 2x attempt at pt unable to  maintain knee flexion with LE sliding out from underneath her x2         Balance Overall balance assessment: Needs assistance Sitting-balance support: Feet supported;No upper extremity supported;Bilateral upper extremity supported;Single extremity supported Sitting balance-Leahy Scale: Fair Sitting balance - Comments: able to sit without assist staticallly needs UE support for dynamic balance   Standing balance support: Bilateral upper extremity supported Standing balance-Leahy Scale: Zero Standing balance comment: unable to attain                             Pertinent Vitals/Pain Pain Assessment: Faces Faces Pain Scale: Hurts whole lot Pain Location: abdomen and generalized with weakness Pain Descriptors / Indicators: Grimacing;Guarding;Moaning Pain Intervention(s): Limited activity within patient's tolerance;Monitored during session;Repositioned    Home Living Family/patient expects to be discharged to:: Private residence Living Arrangements: Children Available Help at Discharge: Family Type of Home: House Home Access: Stairs to enter Entrance Stairs-Rails: Psychiatric nurse of Steps: 5 Home Layout: Two level;Able to live on main level with bedroom/bathroom Home Equipment: Gilford Rile - 2 wheels;Bedside commode;Shower seat;Wheelchair - Biomedical engineer Comments: pt lives with son and daughter in law    Prior Function Level of Independence: Needs assistance   Gait / Transfers Assistance Needed: reports RW usage in home  ADL's / Homemaking Assistance Needed: pt reports being able to bathe and dress but that daughter in-law is nearby           Extremity/Trunk Assessment   Upper Extremity Assessment Upper Extremity Assessment: RUE deficits/detail;LUE deficits/detail;Difficult to  assess due to impaired cognition RUE Deficits / Details: shoulder ROM limited by pain, strength grossly assessed at 3+/5 RUE Coordination: decreased  gross motor;decreased fine motor LUE Deficits / Details: shoulder PROM lacking full flexion, strength grossly assessed 3+/5 LUE Coordination: decreased fine motor;decreased gross motor    Lower Extremity Assessment Lower Extremity Assessment: Generalized weakness;Difficult to assess due to impaired cognition       Communication   Communication: Expressive difficulties;Other (comment)(increased time and effort in responding)  Cognition Arousal/Alertness: Lethargic Behavior During Therapy: Flat affect Overall Cognitive Status: Impaired/Different from baseline Area of Impairment: Orientation;Following commands;Attention;Problem solving;Awareness                 Orientation Level: Disoriented to;Time Current Attention Level: Selective   Following Commands: Follows one step commands with increased time;Follows multi-step commands inconsistently;Follows multi-step commands with increased time   Awareness: Emergent Problem Solving: Slow processing;Decreased initiation;Difficulty sequencing;Requires verbal cues;Requires tactile cues General Comments: pt is very slow in response to communication and with movement      General Comments General comments (skin integrity, edema, etc.): edema in all 4 extremities worst in R UE, Pt on 1L O2 via [place] with SaO2 96%O2, removed and able to maintain SaO2>88%O2 while sitting EoB, with attempt to move to chair SaO2 dropped to 84%O2, 1L O2 via [place] replaced SaO2 >92%O2        Assessment/Plan    PT Assessment Patient needs continued PT services  PT Problem List Decreased strength;Decreased range of motion;Decreased activity tolerance;Decreased balance;Decreased mobility;Decreased coordination;Decreased cognition;Decreased safety awareness;Cardiopulmonary status limiting activity;Pain       PT Treatment Interventions DME instruction;Gait training;Functional mobility training;Therapeutic activities;Therapeutic exercise;Balance training;Cognitive  remediation;Patient/family education    PT Goals (Current goals can be found in the Care Plan section)       Frequency Min 2X/week    AM-PAC PT "6 Clicks" Mobility  Outcome Measure Help needed turning from your back to your side while in a flat bed without using bedrails?: A Lot Help needed moving from lying on your back to sitting on the side of a flat bed without using bedrails?: Total Help needed moving to and from a bed to a chair (including a wheelchair)?: Total Help needed standing up from a chair using your arms (e.g., wheelchair or bedside chair)?: Total Help needed to walk in hospital room?: Total Help needed climbing 3-5 steps with a railing? : Total 6 Click Score: 7    End of Session Equipment Utilized During Treatment: Gait belt;Oxygen Activity Tolerance: Patient tolerated treatment well Patient left: in bed;with call bell/phone within reach;with nursing/sitter in room Nurse Communication: Mobility status PT Visit Diagnosis: Unsteadiness on feet (R26.81);Other abnormalities of gait and mobility (R26.89);Muscle weakness (generalized) (M62.81);History of falling (Z91.81);Difficulty in walking, not elsewhere classified (R26.2);Pain Pain - part of body: (abdomen and generalized with movement)    Time: 2633-3545 PT Time Calculation (min) (ACUTE ONLY): 24 min   Charges:   PT Evaluation $PT Eval Moderate Complexity: 1 Mod PT Treatments $Therapeutic Activity: 8-22 mins        Kathy Lawson B. Migdalia Dk PT, DPT Acute Rehabilitation Services Pager [phone] Office [phone]   [place] 03/22/2019, 12:13 PM

## 2019-03-22 NOTE — Progress Notes (Signed)
Neurology Progress Note   S:// Patient seen and examined. Continues to be encephalopathic but is awake and alert. Offers complaints of pain in multiple places including arms and legs.   O:// Current vital signs: BP (!) 170/96   Pulse 82   Temp 98 F (36.7 C) (Axillary)   Resp 20   Wt 80.1 kg   SpO2 94%   BMI 32.30 kg/m  Vital signs in last 24 hours: Temp:  [97.5 F (36.4 C)-98.8 F (37.1 C)] 98 F (36.7 C) (11/03 0700) Pulse Rate:  [51-114] 82 (11/03 0600) Resp:  [11-23] 20 (11/03 0600) BP: (127-170)/(63-100) 170/96 (11/03 0600) SpO2:  [88 %-96 %] 94 % (11/03 0600) FiO2 (%):  [2 %] 2 % (11/02 1600) Weight:  [80.1 kg] 80.1 kg (11/03 0500) General: Awake alert no apparent distress HEENT: Normocephalic atraumatic Respiratory: Breathing normally and saturating well on room air Cardiovascular: Regular rate rhythm Abdomen: Nondistended nontender Neurological exam She is awake, alert, oriented to self and the fact that she is in hospital. Could not give me the date at the exact place. Her speech is slow but not dysarthric. Very poor attention concentration. No gross signs of aphasia. She is able to follow commands-simple commands consistently. Cranial nerves: Pupils equal round react light, extra movements appear intact, visual fields are full, face appears symmetric, facial sensations intact, tongue and palate midline. Motor exam: Right upper and right lower extremity are 4/5.  Left upper extremity is 4/5.  Left lower extremity is some movement without being antigravity. Significant asterixis on outstretched arms. Sensory exam: Intact to light touch Coordination difficult to assess due to her poor attention concentration.. Gait testing deferred due to current clinical condition  Medications  Current Facility-Administered Medications:  .  0.9 %  sodium chloride infusion, , Intravenous, PRN, Renee Pain, MD, Stopped at 03/22/19 0524 .  amLODipine (NORVASC) tablet 5  mg, 5 mg, Oral, Daily, Hicks, Samantha B, NP, 5 mg at 03/21/19 1709 .  aspirin EC tablet 81 mg, 81 mg, Oral, Daily, Hicks, Samantha B, NP, 81 mg at 03/21/19 1536 .  atorvastatin (LIPITOR) tablet 40 mg, 40 mg, Oral, q1800, Tonye Royalty, NP, 40 mg at 03/21/19 1711 .  budesonide (PULMICORT) nebulizer solution 0.25 mg, 0.25 mg, Nebulization, BID, Hicks, Samantha B, NP, 0.25 mg at 03/21/19 1907 .  ceFAZolin (ANCEF) IVPB 2g/100 mL premix, 2 g, Intravenous, Q8H, Kipp Brood, MD, Stopped at 03/22/19 0540 .  Chlorhexidine Gluconate Cloth 2 % PADS 6 each, 6 each, Topical, Daily, Renee Pain, MD, 6 each at 03/21/19 1200 .  escitalopram (LEXAPRO) tablet 10 mg, 10 mg, Oral, Daily, Tonye Royalty, NP, 10 mg at 03/21/19 1711 .  famotidine (PEPCID) tablet 20 mg, 20 mg, Oral, Daily, Tonye Royalty, NP, 20 mg at 03/21/19 1712 .  heparin injection 5,000 Units, 5,000 Units, Subcutaneous, Q8H, Tonye Royalty, NP, 5,000 Units at 03/22/19 0526 .  insulin aspart (novoLOG) injection 0-9 Units, 0-9 Units, Subcutaneous, TID WC, Hicks, Samantha B, NP, 1 Units at 03/21/19 1600 .  ipratropium-albuterol (DUONEB) 0.5-2.5 (3) MG/3ML nebulizer solution 3 mL, 3 mL, Nebulization, Q6H PRN, Tonye Royalty, NP .  metoprolol tartrate (LOPRESSOR) tablet 12.5 mg, 12.5 mg, Oral, BID, Hicks, Samantha B, NP, 12.5 mg at 03/21/19 2102 .  mupirocin ointment (BACTROBAN) 2 % 1 application, 1 application, Nasal, BID, Renee Pain, MD .  oxyCODONE (Oxy IR/ROXICODONE) immediate release tablet 5 mg, 5 mg, Oral, Q4H PRN, Anders Simmonds, MD, 5 mg at 03/22/19 0119 .  potassium PHOSPHATE  30 mmol in dextrose 5 % 500 mL infusion, 30 mmol, Intravenous, Once, Anders Simmonds, MD, Last Rate: 85 mL/hr at 03/22/19 0600 .  thiamine (B-1) injection 100 mg, 100 mg, Intravenous, Daily, Aroor, Karena Addison R, MD, 100 mg at 03/21/19 0956 .  umeclidinium bromide (INCRUSE ELLIPTA) 62.5 MCG/INH 1 puff, 1 puff, Inhalation, Daily, Renee Pain,  MD .  white petrolatum (VASELINE) gel, , Topical, PRN, Renee Pain, MD Labs CBC    Component Value Date/Time   WBC 10.4 03/21/2019 0227   RBC 3.32 (L) 03/21/2019 0227   HGB 9.9 (L) 03/21/2019 0227   HCT 31.4 (L) 03/21/2019 0227   PLT 388 03/21/2019 0227   MCV 94.6 03/21/2019 0227   MCH 29.8 03/21/2019 0227   MCHC 31.5 03/21/2019 0227   RDW 14.6 03/21/2019 0227   LYMPHSABS 0.6 (L) 03/21/2019 0227   MONOABS 0.3 03/21/2019 0227   EOSABS 0.1 03/21/2019 0227   BASOSABS 0.0 03/21/2019 0227    CMP     Component Value Date/Time   NA 150 (H) 03/22/2019 0233   K 2.8 (L) 03/22/2019 0233   CL 111 03/22/2019 0233   CO2 25 03/22/2019 0233   GLUCOSE 174 (H) 03/22/2019 0233   BUN 36 (H) 03/22/2019 0233   CREATININE 1.13 (H) 03/22/2019 0233   CALCIUM 10.0 03/22/2019 0233   PROT 6.1 (L) 03/21/2019 0227   ALBUMIN 2.8 (L) 03/21/2019 0227   AST 14 (L) 03/21/2019 0227   ALT 16 03/21/2019 0227   ALKPHOS 149 (H) 03/21/2019 0227   BILITOT 0.8 03/21/2019 0227   GFRNONAA 53 (L) 03/22/2019 0233   GFRAA >60 03/22/2019 0233  03/21/2019-ammonia 44. Creatinine 1.13, improved from 1.26, elevated from baseline. B12 levels above normal. B1 levels pending.  Imaging I have reviewed images in epic and the results pertinent to this consultation are: No acute changes on outside CT head  Assessment: 59 year old with past medical history of hypertension hyperlipidemia COPD prolonged hospitalization for pneumonia complicated by C. difficile infection in April 2020 transfer from [place] for persistent encephalopathy with severe sepsis secondary to pyelonephritis.  Concern for meningitis was the reason for transfer.  She has been on antibiotics for an extended period of time. On examination, she has poor attention concentration and significant asterixis Clinical presentation is more suggestive of a multifactorial toxic metabolic encephalopathy.  Less likely to be meningitis, and even if it were, she has  been treated with antibiotics for a while and an LP would not be of much yield. Asterixis on exam-likely secondary to uremia.  Also a small component of hepatic encephalopathy due to elevated ammonia. Was also on cefepime-some of the encephalopathy could be cefepime toxicity.  Impression: Multifactorial toxic metabolic encephalopathy including hepatic encephalopathy and encephalopathy secondary to uremia.   Recommendations: Routine EEG Correction of toxic metabolic derangements per primary team as you are Empiric thiamine Treatment of sepsis per primary team as you are Continue frequent neurochecks Neurology will follow   -- Amie Portland, MD [facility] Pager: [phone] If 7pm to 7am, please call on call as listed on AMION.  CRITICAL CARE ATTESTATION Performed by: Amie Portland, MD Total critical care time: 35 minutes Critical care time was exclusive of separately billable procedures and treating other patients and/or supervising APPs/Residents/Students Critical care was necessary to treat or prevent imminent or life-threatening deterioration due to multifactorial toxic metabolic encephalopathy This patient is critically ill and at significant risk for neurological worsening and/or death and care requires constant monitoring. Critical care was time spent  personally by me on the following activities: development of treatment plan with patient and/or surrogate as well as nursing, discussions with consultants, evaluation of patient's response to treatment, examination of patient, obtaining history from patient or surrogate, ordering and performing treatments and interventions, ordering and review of laboratory studies, ordering and review of radiographic studies, pulse oximetry, re-evaluation of patient's condition, participation in multidisciplinary rounds and medical decision making of high complexity in the care of this patient.

## 2019-03-22 NOTE — Progress Notes (Addendum)
  PROGRESS NOTE    Kathy Lawson  DQQ:[id number] DOB: [birth year] DOA: 03/21/2019 PCP: Damita Dunnings, NP (Inactive)    Brief Narrative:  58 year old woman admitted with altered mental status and E. coli pyelonephritis and bacteremia gradually improving on IV antibiotics. Mentation continues to improve.   Assessment & Plan:  E. Coli bacteremia 2/2 E. Coli and Klebsiella Pyelonephritis -ID following -continue IV cefazolin (switched from IV ceftriaxone) -f/u cultures  Acute Toxic Metabolic Encephalopathy, improving -management as above -Neurology following w/ plans for routine EEG -continue to re-orient  Elevated liver enzymes, resolved --Abdominal u/s 10/31: suspected hepatic steatosis --LFTs normalized   Ventral abdominal hernia --bowel w/no strangulation or ischemic changes on imaging --Seen by general surgery at [facility] who recommended no immediate surgical intervention --Will need surgical follow up for hernia as outpatient  Acute respiratory insufficiency, resolved  --No bronchospasm on exam --CXR with atelectasis --goal O2 sats 88-92% given history of COPD  Acute renal failure, resolved  DVT prophylaxis: Heparin SQ Code Status:Full code   Consultants:   ID, Neurology, PCCM  Procedures:   n/a  Antimicrobials:  IV cefazoline    Subjective: Endorses generalized mild pain but denies n/v/d/c, fevers or chills.  Objective: Vitals:   03/22/19 0600 03/22/19 0700 03/22/19 0831 03/22/19 0900  BP: (!) 170/96 (!) 155/87    Pulse: 82 75 97   Resp: 20 15 20    Temp:  98 F (36.7 C)    TempSrc:  Axillary    SpO2: 94% 96% 94% (!) 87%  Weight:        Intake/Output Summary (Last 24 hours) at 03/22/2019 0913 Last data filed at 03/22/2019 0800 Gross per 24 hour  Intake 620.67 ml  Output 1800 ml  Net -1179.33 ml   Filed Weights   03/21/19 0218 03/22/19 0500  Weight: 79.6 kg 80.1 kg    Examination:  General exam: Appears calm and  comfortable  Respiratory system: Clear to auscultation. Respiratory effort normal. Cardiovascular system: S1 & S2 heard, RRR. No JVD, murmurs, rubs, gallops or clicks. No pedal edema. Gastrointestinal system: Abdomen is nondistended, soft and nontender. No organomegaly or masses felt. Normal bowel sounds heard. Central nervous system: Alert and oriented. No focal neurological deficits. Slow to respond but responds appropriately. Extremities: Symmetric 5 x 5 power. Skin: Thin with small areas of ecchymosis on b/l UE  Scheduled Meds: . amLODipine  5 mg Oral Daily  . aspirin EC  81 mg Oral Daily  . atorvastatin  40 mg Oral q1800  . budesonide (PULMICORT) nebulizer solution  0.25 mg Nebulization BID  . Chlorhexidine Gluconate Cloth  6 each Topical Daily  . escitalopram  10 mg Oral Daily  . famotidine  20 mg Oral Daily  . heparin  5,000 Units Subcutaneous Q8H  . insulin aspart  0-9 Units Subcutaneous TID WC  . metoprolol tartrate  12.5 mg Oral BID  . mupirocin ointment  1 application Nasal BID  . thiamine injection  100 mg Intravenous Daily  . umeclidinium bromide  1 puff Inhalation Daily   Continuous Infusions: . sodium chloride Stopped (03/22/19 0524)  .  ceFAZolin (ANCEF) IV Stopped (03/22/19 0540)  . potassium PHOSPHATE IVPB (in mmol) 85 mL/hr at 03/22/19 0600     LOS: 1 day   Time spent: 35 mins  Charolotte Capuchin, MD [facility] Pager [phone]  If 7PM-7AM, please contact night-coverage [email or web address] Password [facility] 03/22/2019, 9:13 AM

## 2019-03-22 NOTE — Progress Notes (Signed)
[facility] for Infectious Disease   Reason for visit: Follow up on pyelonephritis  Interval History: getting EEG now, remains afebrile, no acute events.  No associated rash or diarrhea. No new complaints.    Physical Exam: Constitutional:  Vitals:   03/22/19 0831 03/22/19 0900  BP:    Pulse: 97   Resp: 20   Temp:    SpO2: 94% (!) 87%   patient appears in NAD Eyes: anicteric Respiratory: Normal respiratory effort; CTA B Cardiovascular: RRR GI: soft, nt, nd  Review of Systems: Constitutional: negative for fevers and chills Gastrointestinal: positive for nausea, negative for diarrhea Integument/breast: negative for rash  Lab Results  Component Value Date   WBC 10.4 03/21/2019   HGB 9.9 (L) 03/21/2019   HCT 31.4 (L) 03/21/2019   MCV 94.6 03/21/2019   PLT 388 03/21/2019    Lab Results  Component Value Date   CREATININE 1.13 (H) 03/22/2019   BUN 36 (H) 03/22/2019   NA 150 (H) 03/22/2019   K 2.8 (L) 03/22/2019   CL 111 03/22/2019   CO2 25 03/22/2019    Lab Results  Component Value Date   ALT 16 03/21/2019   AST 14 (L) 03/21/2019   ALKPHOS 149 (H) 03/21/2019     Microbiology: Recent Results (from the past 240 hour(s))  Culture, blood (routine x 2)     Status: None (Preliminary result)   Collection Time: 03/21/19  2:26 AM   Specimen: BLOOD  Result Value Ref Range Status   Specimen Description BLOOD RIGHT ANTECUBITAL  Final   Special Requests   Final    BOTTLES DRAWN AEROBIC AND ANAEROBIC Blood Culture adequate volume   Culture   Final    NO GROWTH 1 DAY Performed at [facility], [address] [place], [place] [address]    Report Status PENDING  Incomplete  Culture, blood (routine x 2)     Status: None (Preliminary result)   Collection Time: 03/21/19  2:27 AM   Specimen: BLOOD LEFT HAND  Result Value Ref Range Status   Specimen Description BLOOD LEFT HAND  Final   Special Requests   Final    BOTTLES DRAWN AEROBIC ONLY Blood Culture  adequate volume   Culture   Final    NO GROWTH 1 DAY Performed at [facility], [place] [address] [place], [place] [address]    Report Status PENDING  Incomplete  MRSA PCR Screening     Status: Abnormal   Collection Time: 03/22/19  5:02 AM   Specimen: Nasal Mucosa; Nasopharyngeal  Result Value Ref Range Status   MRSA by PCR POSITIVE (A) NEGATIVE Final    Comment:        The GeneXpert MRSA Assay (FDA approved for NASAL specimens only), is one component of a comprehensive MRSA colonization surveillance program. It is not intended to diagnose MRSA infection nor to guide or monitor treatment for MRSA infections. RESULT CALLED TO, READ BACK BY AND VERIFIED WITH: PHILLIPS,K RN 0630 03/22/2019 MITCHELL,L Performed at [facility], [place] [address], [place], [place] [address]     Impression/Plan:  1. Pyelonephritis - on cefazolin and no changes.  Blood cultures here with ngtd.    2.  Encephalopathy - I agree with neurology that meningitis unlikely and that encephalopathy from underlying pyelonephritis.  Receiving thiamine as well.  Getting EEG now.   3.  AKI - elevated creat here, previously normal in April.  Did receive vancomycin at [facility].  Will continue to monitor.

## 2019-03-22 NOTE — Progress Notes (Signed)
[facility] Progress Note Patient Name: Kathy Lawson DOB: 11-Jun-1960 MRN: [id number]   Date of Service  03/22/2019  HPI/Events of Note  K+ = 2.8, PO4--- = 2.0 and Creatinine = 1.13.   eICU Interventions  Will replace K+ and PO4---.     Intervention Category Major Interventions: Electrolyte abnormality - evaluation and management  Dallin Mccorkel [place] 03/22/2019, 5:02 AM

## 2019-03-22 NOTE — Progress Notes (Signed)
Report called to 5MW RN.

## 2019-03-22 NOTE — Progress Notes (Signed)
[facility] Progress Note Patient Name: Kathy Lawson DOB: 05-15-1961 MRN: [id number]   Date of Service  03/22/2019  HPI/Events of Note  Bradycardia - HR dips into 50's. Currently, HR = 79.  eICU Interventions  Will order: 1. Hold further Metoprolol doses until patient is evaluated by rounding team.      Intervention Category Major Interventions: Arrhythmia - evaluation and management  Whittaker Lenis [place] 03/22/2019, 5:08 AM

## 2019-03-22 NOTE — Progress Notes (Signed)
EEG complete - results pending 

## 2019-03-23 DIAGNOSIS — N12 Tubulo-interstitial nephritis, not specified as acute or chronic: Secondary | ICD-10-CM

## 2019-03-23 DIAGNOSIS — R7881 Bacteremia: Secondary | ICD-10-CM

## 2019-03-23 LAB — GLUCOSE, CAPILLARY
Glucose-Capillary: 146 mg/dL — ABNORMAL HIGH (ref 70–99)
Glucose-Capillary: 163 mg/dL — ABNORMAL HIGH (ref 70–99)
Glucose-Capillary: 139 mg/dL — ABNORMAL HIGH (ref 70–99)
Glucose-Capillary: 137 mg/dL — ABNORMAL HIGH (ref 70–99)

## 2019-03-23 MED ORDER — PANTOPRAZOLE SODIUM 40 MG PO TBEC
40.0000 mg | DELAYED_RELEASE_TABLET | Freq: Every day | ORAL | Status: DC
  Administered 2019-03-23 – 2019-03-31 (×9): 40 mg via ORAL
  Filled 2019-03-23 (×9): qty 1

## 2019-03-23 MED ORDER — VITAMIN B-1 100 MG PO TABS
100.0000 mg | ORAL_TABLET | Freq: Every day | ORAL | Status: DC
  Administered 2019-03-23 – 2019-03-31 (×9): 100 mg via ORAL
  Filled 2019-03-23 (×9): qty 1

## 2019-03-23 MED ORDER — CEPHALEXIN 500 MG PO CAPS
500.0000 mg | ORAL_CAPSULE | Freq: Four times a day (QID) | ORAL | Status: AC
  Administered 2019-03-24 – 2019-03-30 (×26): 500 mg via ORAL
  Filled 2019-03-23 (×30): qty 1

## 2019-03-23 NOTE — Evaluation (Signed)
Occupational Therapy Evaluation Patient Details Name: Kathy Lawson MRN: [id number] DOB: Nov 02, 1960 Today's Date: 03/23/2019    History of Present Illness 58yo female w/ PMH significant for HTN, HLD, CAD, MI, COPD, asthma, crohns disease, chronic anemia and hypothyroidism who was a transfer from [facility] on 03/21/2019 after presenting there 03/17/2019 w/ pyelonephritis and E-coli bacteremia c/b worsening encephalopathy and concern for meningitis   Clinical Impression   Pt PTA: Pt unable to properly care for self and was mostly transferring for mobility. Pt currently limited by decreased strength, poor activity tolerance and decreased ability to care for self. Pt unable to extend knees and pt unable to bear weight on BLEs so no standing position achieved today. Pt bed mobility minA today. Pt minA to maxA overall for ADL and requiring frequent rest breaks due to poor endurance. BUE HEP initiated with pursed lip breathing. Pt would greatly benefit from continued OT skilled services for ADL, mobility and safety in SNF setting. OT following acutely.     Follow Up Recommendations  SNF;Supervision/Assistance - 24 hour    Equipment Recommendations  Other (comment)(to be determined at next venue)    Recommendations for Other Services       Precautions / Restrictions Precautions Precautions: Fall Precaution Comments: reports falls at home Restrictions Weight Bearing Restrictions: No      Mobility Bed Mobility Overal bed mobility: Needs Assistance Bed Mobility: Supine to Sit;Sit to Supine     Supine to sit: Min assist;HOB elevated Sit to supine: Min assist;HOB elevated   General bed mobility comments: + use of rail  Transfers Overall transfer level: Needs assistance   Transfers: Sit to/from Stand Sit to Stand: Total assist         General transfer comment: Pt unable to extend knees and pt unable to bear weight on BLEs so no standing position achieved today.     Balance Overall balance assessment: Needs assistance Sitting-balance support: Feet supported;Bilateral upper extremity supported;Single extremity supported Sitting balance-Leahy Scale: Fair Sitting balance - Comments: to perform all functional tasks                                   ADL either performed or assessed with clinical judgement   ADL Overall ADL's : Needs assistance/impaired Eating/Feeding: Modified independent   Grooming: Set up;Sitting   Upper Body Bathing: Minimal assistance;Sitting   Lower Body Bathing: Moderate assistance;Sitting/lateral leans;Bed level   Upper Body Dressing : Minimal assistance;Sitting;Bed level   Lower Body Dressing: Moderate assistance;Cueing for safety;Sitting/lateral leans;Bed level   Toilet Transfer: Total assistance;+2 for physical assistance;+2 for safety/equipment;Cueing for safety;Stand-pivot   Toileting- Clothing Manipulation and Hygiene: Maximal assistance;Cueing for safety;Sitting/lateral lean;Bed level       Functional mobility during ADLs: Total assistance General ADL Comments: Pt limited by decreased strength, poor activity tolerance and decreased ability to care for self.     Vision Baseline Vision/History: No visual deficits Vision Assessment?: No apparent visual deficits     Perception     Praxis      Pertinent Vitals/Pain Pain Assessment: Faces Faces Pain Scale: Hurts little more Pain Location: abdomen and generalized with weakness Pain Descriptors / Indicators: Discomfort Pain Intervention(s): Limited activity within patient's tolerance     Hand Dominance Right   Extremity/Trunk Assessment Upper Extremity Assessment Upper Extremity Assessment: Generalized weakness RUE Deficits / Details: 3+/5 MM grade >90* shoulder flex; pt groans LUE Deficits / Details: 3+/5 MM grade >90*  shoulder flex; pt groans   Lower Extremity Assessment Lower Extremity Assessment: Defer to PT evaluation   Cervical /  Trunk Assessment Cervical / Trunk Assessment: Normal   Communication Communication Communication: Expressive difficulties;Other (comment)(delayed response)   Cognition Arousal/Alertness: Lethargic Behavior During Therapy: Flat affect Overall Cognitive Status: Impaired/Different from baseline Area of Impairment: Orientation;Following commands;Attention;Problem solving;Awareness                 Orientation Level: Disoriented to;Time Current Attention Level: Selective   Following Commands: Follows one step commands with increased time;Follows multi-step commands inconsistently;Follows multi-step commands with increased time   Awareness: Emergent Problem Solving: Slow processing;Decreased initiation;Difficulty sequencing;Requires verbal cues;Requires tactile cues General Comments: Pt delayed with processing and anticipatory of something bad happening?   General Comments  Pt requires clear, concise verbal cues or commands.    Exercises     Shoulder Instructions      Home Living Family/patient expects to be discharged to:: Private residence Living Arrangements: Children Available Help at Discharge: Family;Available PRN/intermittently Type of Home: House Home Access: Stairs to enter [employer] of Steps: 5 Entrance Stairs-Rails: Right;Left Home Layout: Two level;Able to live on main level with bedroom/bathroom     Bathroom Shower/Tub: Teacher, early years/pre: Standard Bathroom Accessibility: Yes   Home Equipment: Walker - 2 wheels;Bedside commode;Shower seat;Wheelchair - Media planner Comments: pt lives with son and daughter in law      Prior Functioning/Environment Level of Independence: Needs assistance  Gait / Transfers Assistance Needed: reports RW usage in home ADL's / Homemaking Assistance Needed: pt reports being able to bathe and dress but that daughter in-law is nearby            OT Problem List: Decreased  strength;Decreased activity tolerance;Impaired balance (sitting and/or standing);Decreased safety awareness;Impaired UE functional use;Pain;Decreased cognition;Decreased coordination      OT Treatment/Interventions: Self-care/ADL training;Therapeutic exercise;Energy conservation;DME and/or AE instruction;Therapeutic activities;Patient/family education;Balance training    OT Goals(Current goals can be found in the care plan section) Acute Rehab OT Goals Patient Stated Goal: to get stronger OT Goal Formulation: With patient Time For Goal Achievement: 04/06/19 Potential to Achieve Goals: Good ADL Goals Pt Will Perform Grooming: with supervision;standing Pt Will Perform Lower Body Dressing: with min assist;sitting/lateral leans Pt Will Transfer to Toilet: with mod assist;bedside commode Pt Will Perform Toileting - Clothing Manipulation and hygiene: sitting/lateral leans;sit to/from stand;with min assist Pt/caregiver will Perform Home Exercise Program: Both right and left upper extremity;Increased strength;With written HEP provided Additional ADL Goal #1: Pt will increase activity tolerance to sitting EOB or OOB ADL to 15 mins with minimal rest breaks.  OT Frequency: Min 2X/week   Barriers to D/C:            Co-evaluation              AM-PAC OT "6 Clicks" Daily Activity     Outcome Measure Help from another person eating meals?: None Help from another person taking care of personal grooming?: A Little Help from another person toileting, which includes using toliet, bedpan, or urinal?: A Lot Help from another person bathing (including washing, rinsing, drying)?: A Lot Help from another person to put on and taking off regular upper body clothing?: A Little Help from another person to put on and taking off regular lower body clothing?: A Lot 6 Click Score: 16   End of Session Equipment Utilized During Treatment: Gait belt Nurse Communication: Mobility status  Activity Tolerance:  Patient tolerated treatment  well Patient left: in bed;with call bell/phone within reach;with bed alarm set  OT Visit Diagnosis: Unsteadiness on feet (R26.81);Muscle weakness (generalized) (M62.81)                Time: 7793-9688 OT Time Calculation (min): 35 min Charges:  OT General Charges $OT Visit: 1 Visit OT Evaluation $OT Eval Moderate Complexity: 1 Mod OT Treatments $Self Care/Home Management : 8-22 mins  Ebony Hail Harold Hedge) Marsa Aris OTR/L Acute Rehabilitation Services Pager: [phone] Office: [place] 03/23/2019, 4:18 PM

## 2019-03-23 NOTE — Progress Notes (Signed)
Patient refusing telemetry box and antibiotics

## 2019-03-23 NOTE — Progress Notes (Signed)
PROGRESS NOTE    Kathy Lawson  JZP:[id number] DOB: 15-Feb-1961 DOA: 03/21/2019 PCP: Damita Dunnings, NP (Inactive)    Brief Narrative:  58 year old woman admitted with altered mental status and E. coli pyelonephritis and bacteremia gradually improving on IV antibiotics. Mentation continues to improve.   Assessment & Plan:  E. Coli bacteremia 2/2 E. Coli and Klebsiella Pyelonephritis -ID following -Switch from IV cefazolin to p.o. Keflex for another 7 days of antibiotic therapy -f/u cultures  Acute Toxic Metabolic Encephalopathy, slowly improving -management as above -Neurology following w/ routine EEG consistent with toxic metabolic encephalopathy -continue to re-orient  Elevated liver enzymes, resolved --Abdominal u/s 10/31: suspected hepatic steatosis --LFTs normalized   Ventral abdominal hernia --bowel w/no strangulation or ischemic changes on imaging --Seen by general surgery at [facility] who recommended no immediate surgical intervention --Will need surgical follow up for hernia as outpatient  Acute respiratory insufficiency, resolved  --No bronchospasm on exam --CXR with atelectasis --goal O2 sats 88-92% given history of COPD  Acute renal failure, resolved  DVT prophylaxis: Heparin SQ Code Status:Full code   Consultants:   ID, Neurology, PCCM  Procedures:   n/a  Antimicrobials:  IV cefazoline    Subjective: Endorses generalized mild pain but denies n/v/d/c, fevers or chills.  Slightly slower to respond today but does respond appropriately.  Objective: Vitals:   03/22/19 2145 03/23/19 0541 03/23/19 0817 03/23/19 0819  BP:  (!) 158/83 (!) 154/81   Pulse:  84 (!) 101 98  Resp:  16 18 18   Temp:  98.2 F (36.8 C) 98.6 F (37 C)   TempSrc:   Oral   SpO2: 96% 96% 92% 94%  Weight:        Intake/Output Summary (Last 24 hours) at 03/23/2019 1518 Last data filed at 03/23/2019 0900 Gross per 24 hour  Intake 429.65 ml  Output 225 ml   Net 204.65 ml   Filed Weights   03/21/19 0218 03/22/19 0500  Weight: 79.6 kg 80.1 kg    Examination:  General exam: Appears calm and comfortable  Respiratory system: Clear to auscultation. Respiratory effort normal. Cardiovascular system: S1 & S2 heard, RRR. No JVD, murmurs, rubs, gallops or clicks. No pedal edema. Gastrointestinal system: Abdomen is nondistended, soft and nontender. No organomegaly or masses felt. Normal bowel sounds heard. Central nervous system: Alert and oriented x 3. No focal neurological deficits. Slow to respond but responds appropriately. Extremities: Symmetric 5 x 5 power. Skin: Thin with small areas of ecchymosis on b/l UE  Scheduled Meds: . amLODipine  5 mg Oral Daily  . aspirin EC  81 mg Oral Daily  . atorvastatin  40 mg Oral q1800  . budesonide (PULMICORT) nebulizer solution  0.25 mg Nebulization BID  . cephALEXin  500 mg Oral Q6H  . Chlorhexidine Gluconate Cloth  6 each Topical Daily  . escitalopram  10 mg Oral Daily  . famotidine  20 mg Oral Daily  . heparin  5,000 Units Subcutaneous Q8H  . insulin aspart  0-9 Units Subcutaneous TID WC  . metoprolol tartrate  12.5 mg Oral BID  . mupirocin ointment  1 application Nasal BID  . pantoprazole  40 mg Oral Daily  . thiamine  100 mg Oral Daily  . umeclidinium bromide  1 puff Inhalation Daily   Continuous Infusions: . sodium chloride Stopped (03/22/19 1757)     LOS: 2 days   Time spent: 25 mins  Charolotte Capuchin, MD [facility] Pager [phone]  If 7PM-7AM, please contact night-coverage [email or web address]  Password [email or web address] 03/23/2019, 3:18 PM

## 2019-03-23 NOTE — Progress Notes (Signed)
Pt refused pulmicort nebulizer this a.m.

## 2019-03-23 NOTE — Progress Notes (Addendum)
[facility] for Infectious Disease  Date of Admission:  03/21/2019      Total days of antibiotics 4  Cefazolin day 3           ASSESSMENT: Kathy Lawson is a 58 y.o. female with bilateral pyelonephritis and encephalopathy, which seems improved today. Will convert her to oral cephalexin 500 mg QID for an additional week.   Will sign off, thank you and please call for questions.     PLAN: 1. Change to cephalexin 500 mg QID x 7 more days.     Principal Problem:   Pyelonephritis Active Problems:   E coli bacteremia   Sepsis ([place])   Acute encephalopathy   Normocytic anemia   Hypomagnesemia   UTI (urinary tract infection)   . amLODipine  5 mg Oral Daily  . aspirin EC  81 mg Oral Daily  . atorvastatin  40 mg Oral q1800  . budesonide (PULMICORT) nebulizer solution  0.25 mg Nebulization BID  . cephALEXin  500 mg Oral Q8H  . Chlorhexidine Gluconate Cloth  6 each Topical Daily  . escitalopram  10 mg Oral Daily  . famotidine  20 mg Oral Daily  . heparin  5,000 Units Subcutaneous Q8H  . insulin aspart  0-9 Units Subcutaneous TID WC  . metoprolol tartrate  12.5 mg Oral BID  . mupirocin ointment  1 application Nasal BID  . pantoprazole  40 mg Oral Daily  . thiamine  100 mg Oral Daily  . umeclidinium bromide  1 puff Inhalation Daily    SUBJECTIVE: Feeling OK. Did not get great sleep last night. No new complaints.   Review of Systems: Review of Systems  Constitutional: Negative for chills, fever and malaise/fatigue.  Respiratory: Negative for cough and shortness of breath.   Gastrointestinal: Negative for abdominal pain and vomiting.  Genitourinary: Negative for dysuria, flank pain and frequency.  Skin: Negative for itching and rash.  Neurological: Negative for dizziness and headaches.    Allergies  Allergen Reactions  . Lasix [Furosemide] Other (See Comments)    Causes hyponatremia   . Other Other (See Comments)    Sulfonamide (substance) -  unknown reaction  . Latex Rash    OBJECTIVE: Vitals:   03/22/19 2145 03/23/19 0541 03/23/19 0817 03/23/19 0819  BP:  (!) 158/83 (!) 154/81   Pulse:  84 (!) 101 98  Resp:  16 18 18   Temp:  98.2 F (36.8 C) 98.6 F (37 C)   TempSrc:   Oral   SpO2: 96% 96% 92% 94%  Weight:       Body mass index is 32.3 kg/m.  Physical Exam Vitals signs reviewed.  Constitutional:      Appearance: Normal appearance. She is well-developed.     Comments: Resting quietly in bed watching TV.   HENT:     Mouth/Throat:     Mouth: No oral lesions.     Dentition: Normal dentition. No dental abscesses.  Cardiovascular:     Rate and Rhythm: Normal rate.  Pulmonary:     Effort: Pulmonary effort is normal.  Abdominal:     Palpations: Abdomen is soft.  Skin:    General: Skin is warm and dry.     Capillary Refill: Capillary refill takes less than 2 seconds.     Findings: No rash.  Neurological:     Mental Status: She is alert.     Comments: Slow to answer. + eye contact. Appropriate answers to orientation  questions today.      Lab Results Lab Results  Component Value Date   WBC 10.4 03/21/2019   HGB 9.9 (L) 03/21/2019   HCT 31.4 (L) 03/21/2019   MCV 94.6 03/21/2019   PLT 388 03/21/2019    Lab Results  Component Value Date   CREATININE 1.13 (H) 03/22/2019   BUN 36 (H) 03/22/2019   NA 150 (H) 03/22/2019   K 2.8 (L) 03/22/2019   CL 111 03/22/2019   CO2 25 03/22/2019    Lab Results  Component Value Date   ALT 16 03/21/2019   AST 14 (L) 03/21/2019   ALKPHOS 149 (H) 03/21/2019   BILITOT 0.8 03/21/2019     Microbiology: Recent Results (from the past 240 hour(s))  Culture, blood (routine x 2)     Status: None (Preliminary result)   Collection Time: 03/21/19  2:26 AM   Specimen: BLOOD  Result Value Ref Range Status   Specimen Description BLOOD RIGHT ANTECUBITAL  Final   Special Requests   Final    BOTTLES DRAWN AEROBIC AND ANAEROBIC Blood Culture adequate volume   Culture   Final     NO GROWTH 1 DAY Performed at [facility], [address], [place], [place] [address]    Report Status PENDING  Incomplete  Culture, blood (routine x 2)     Status: None (Preliminary result)   Collection Time: 03/21/19  2:27 AM   Specimen: BLOOD LEFT HAND  Result Value Ref Range Status   Specimen Description BLOOD LEFT HAND  Final   Special Requests   Final    BOTTLES DRAWN AEROBIC ONLY Blood Culture adequate volume   Culture   Final    NO GROWTH 1 DAY Performed at [facility], [place] [address] [place], [place] [address]    Report Status PENDING  Incomplete  MRSA PCR Screening     Status: Abnormal   Collection Time: 03/22/19  5:02 AM   Specimen: Nasal Mucosa; Nasopharyngeal  Result Value Ref Range Status   MRSA by PCR POSITIVE (A) NEGATIVE Final    Comment:        The GeneXpert MRSA Assay (FDA approved for NASAL specimens only), is one component of a comprehensive MRSA colonization surveillance program. It is not intended to diagnose MRSA infection nor to guide or monitor treatment for MRSA infections. RESULT CALLED TO, READ BACK BY AND VERIFIED WITH: PHILLIPS,K RN 0630 03/22/2019 MITCHELL,L Performed at [facility], [place] [address] [place], [place] [address]     Janene Madeira, MSN, NP-C [facility] Infect[facility] [email or web address] Pager: [phone] Office: [phone] [place]: [phone]

## 2019-03-23 NOTE — Progress Notes (Signed)
Neurology Progress Note   S:// Patient seen and examined. More awake today. Still complains of pain in the arms and legs. Also complains of being able to smell smoke in the room and appears concerned because of that. Denies seeing any people that are not there or hearing any voices.  O:// Current vital signs: BP (!) 154/81 (BP Location: Right Arm)   Pulse 98   Temp 98.6 F (37 C) (Oral)   Resp 18   Wt 80.1 kg   SpO2 94%   BMI 32.30 kg/m  Vital signs in last 24 hours: Temp:  [97.6 F (36.4 C)-98.6 F (37 C)] 98.6 F (37 C) (11/04 0817) Pulse Rate:  [65-101] 98 (11/04 0819) Resp:  [14-23] 18 (11/04 0819) BP: (125-158)/(59-94) 154/81 (11/04 0817) SpO2:  [88 %-96 %] 94 % (11/04 0819) FiO2 (%):  [21 %] 21 % (11/04 0819) General: Awake alert in no distress HEENT: Normocephalic atraumatic Respiratory: Breathing normally saturating well on room air Cardiovascular: Regular rate rhythm Abdomen: Nondistended nontender Neurological exam Awake alert oriented to self, oriented to the fact that she in the [facility], oriented to month and year. No evidence of aphasia No dysarthria Mildly reduced attention concentration-improved from yesterday Cranial nerves II to XII within normal limits Motor exam: Symmetric 4-4+/5 strength in both upper extremities.  Some asterixis still noted.  Lower extremities generally weak with barely antigravity strength. Difficult to assess coordination but no obvious dysmetria Sensory; intact light touch all over.  Medications  Current Facility-Administered Medications:  .  0.9 %  sodium chloride infusion, , Intravenous, PRN, Renee Pain, MD, Stopped at 03/22/19 1757 .  amLODipine (NORVASC) tablet 5 mg, 5 mg, Oral, Daily, Hicks, Samantha B, NP, 5 mg at 03/23/19 0815 .  aspirin EC tablet 81 mg, 81 mg, Oral, Daily, Hicks, Samantha B, NP, 81 mg at 03/23/19 0815 .  atorvastatin (LIPITOR) tablet 40 mg, 40 mg, Oral, q1800, Tonye Royalty, NP, 40 mg at 03/22/19 1741 .  budesonide (PULMICORT) nebulizer solution 0.25 mg, 0.25 mg, Nebulization, BID, Hicks, Samantha B, NP, 0.25 mg at 03/23/19 0815 .  ceFAZolin (ANCEF) IVPB 2g/100 mL premix, 2 g, Intravenous, Q8H, Agarwala, Ravi, MD, Last Rate: 200 mL/hr at 03/23/19 0603, 2 g at 03/23/19 0603 .  Chlorhexidine Gluconate Cloth 2 % PADS 6 each, 6 each, Topical, Daily, Renee Pain, MD, 6 each at 03/23/19 [phone] .  escitalopram (LEXAPRO) tablet 10 mg, 10 mg, Oral, Daily, Tonye Royalty, NP, 10 mg at 03/23/19 0815 .  famotidine (PEPCID) tablet 20 mg, 20 mg, Oral, Daily, Tonye Royalty, NP, 20 mg at 03/23/19 0816 .  heparin injection 5,000 Units, 5,000 Units, Subcutaneous, Q8H, Tonye Royalty, NP, 5,000 Units at 03/23/19 [phone] .  insulin aspart (novoLOG) injection 0-9 Units, 0-9 Units, Subcutaneous, TID WC, Hicks, Samantha B, NP, 1 Units at 03/23/19 0818 .  ipratropium-albuterol (DUONEB) 0.5-2.5 (3) MG/3ML nebulizer solution 3 mL, 3 mL, Nebulization, Q6H PRN, Tonye Royalty, NP .  metoprolol tartrate (LOPRESSOR) tablet 12.5 mg, 12.5 mg, Oral, BID, Hicks, Samantha B, NP, 12.5 mg at 03/23/19 0815 .  mupirocin ointment (BACTROBAN) 2 % 1 application, 1 application, Nasal, BID, Renee Pain, MD, 1 application at 53/97/67 0817 .  ondansetron (ZOFRAN) injection 4 mg, 4 mg, Intravenous, Q8H PRN, Theresa Duty, Anurag, MD, 4 mg at 03/22/19 0904 .  oxyCODONE (Oxy IR/ROXICODONE) immediate release tablet 5 mg, 5 mg, Oral, Q4H PRN, Anders Simmonds, MD, 5 mg at 03/23/19 0829 .  pantoprazole (PROTONIX) EC tablet 40  mg, 40 mg, Oral, Daily, Theresa Duty, Anurag, MD, 40 mg at 03/23/19 0815 .  thiamine (VITAMIN B-1) tablet 100 mg, 100 mg, Oral, Daily, Satsangi, Anurag, MD, 100 mg at 03/23/19 0815 .  umeclidinium bromide (INCRUSE ELLIPTA) 62.5 MCG/INH 1 puff, 1 puff, Inhalation, Daily, Renee Pain, MD, 1 puff at 03/23/19 0817 .  white petrolatum (VASELINE) gel, , Topical, PRN, Renee Pain,  MD Labs CBC    Component Value Date/Time   WBC 10.4 03/21/2019 0227   RBC 3.32 (L) 03/21/2019 0227   HGB 9.9 (L) 03/21/2019 0227   HCT 31.4 (L) 03/21/2019 0227   PLT 388 03/21/2019 0227   MCV 94.6 03/21/2019 0227   MCH 29.8 03/21/2019 0227   MCHC 31.5 03/21/2019 0227   RDW 14.6 03/21/2019 0227   LYMPHSABS 0.6 (L) 03/21/2019 0227   MONOABS 0.3 03/21/2019 0227   EOSABS 0.1 03/21/2019 0227   BASOSABS 0.0 03/21/2019 0227    CMP     Component Value Date/Time   NA 150 (H) 03/22/2019 0233   K 2.8 (L) 03/22/2019 0233   CL 111 03/22/2019 0233   CO2 25 03/22/2019 0233   GLUCOSE 174 (H) 03/22/2019 0233   BUN 36 (H) 03/22/2019 0233   CREATININE 1.13 (H) 03/22/2019 0233   CALCIUM 10.0 03/22/2019 0233   PROT 6.1 (L) 03/21/2019 0227   ALBUMIN 2.8 (L) 03/21/2019 0227   AST 14 (L) 03/21/2019 0227   ALT 16 03/21/2019 0227   ALKPHOS 149 (H) 03/21/2019 0227   BILITOT 0.8 03/21/2019 0227   GFRNONAA 53 (L) 03/22/2019 0233   GFRAA >60 03/22/2019 0233   Imaging I have reviewed images in epic and the results pertinent to this consultation are: No new imaging to review  Assessment: 58 year old past history of hypertension hyperlipidemia COPD prolonged hospitalization with pneumonia complicated by C. difficile infection, that was in April 2020, was transferred from [place] for persistent encephalopathy in the setting of severe sepsis, MRSA bacteremia and pyelonephritis.  There is concern for meningitis but she had been treated for antibiotics for a few days prior to her transfer. LP less likely to be of use and hence was not recommended. She is clinically showing improvement. Continue have some asterixis, which could be secondary to multiple toxic metabolic derangements. Also was on cefepime-some of the encephalopathy could have been related to cefepime toxicity which is a documented side effect.   Impression: Multifactorial toxic metabolic encephalopathy including hepatic encephalopathy and  uremia causing encephalopathy  Recommendations: Routine EEG was unremarkable for seizure activity or any focal findings.  Do not have concern for seizure.  Continue correction of toxic metabolic derangements per primary team as you are  Continue the thiamine supplementation  Continue treatment of sepsis/infections per primary team as you are  Neurology will be available as needed.  Please call with questions.   -- Amie Portland, MD [facility] Pager: [phone] If 7pm to 7am, please call on call as listed on AMION.

## 2019-03-24 LAB — COMPREHENSIVE METABOLIC PANEL
ALT: 10 U/L (ref 0–44)
AST: 16 U/L (ref 15–41)
Albumin: 2.8 g/dL — ABNORMAL LOW (ref 3.5–5.0)
Alkaline Phosphatase: 111 U/L (ref 38–126)
Anion gap: 11 (ref 5–15)
BUN: 12 mg/dL (ref 6–20)
CO2: 30 mmol/L (ref 22–32)
Calcium: 9 mg/dL (ref 8.9–10.3)
Chloride: 106 mmol/L (ref 98–111)
Creatinine, Ser: 0.72 mg/dL (ref 0.44–1.00)
GFR calc Af Amer: >60 mL/min (ref >60)
GFR calc non Af Amer: >60 mL/min (ref >60)
Glucose, Bld: 124 mg/dL — ABNORMAL HIGH (ref 70–99)
Potassium: 2.3 mmol/L — CL (ref 3.5–5.1)
Sodium: 147 mmol/L — ABNORMAL HIGH (ref 135–145)
Total Bilirubin: 0.6 mg/dL (ref 0.3–1.2)
Total Protein: 6.1 g/dL — ABNORMAL LOW (ref 6.5–8.1)

## 2019-03-24 LAB — GLUCOSE, CAPILLARY
Glucose-Capillary: 118 mg/dL — ABNORMAL HIGH (ref 70–99)
Glucose-Capillary: 142 mg/dL — ABNORMAL HIGH (ref 70–99)
Glucose-Capillary: 137 mg/dL — ABNORMAL HIGH (ref 70–99)
Glucose-Capillary: 116 mg/dL — ABNORMAL HIGH (ref 70–99)

## 2019-03-24 LAB — MAGNESIUM: Magnesium: 1.8 mg/dL (ref 1.7–2.4)

## 2019-03-24 MED ORDER — POTASSIUM CHLORIDE 10 MEQ/100ML IV SOLN
10.0000 meq | INTRAVENOUS | Status: AC
  Administered 2019-03-24 (×5): 10 meq via INTRAVENOUS
  Filled 2019-03-24 (×3): qty 100

## 2019-03-24 MED ORDER — POTASSIUM CHLORIDE 10 MEQ/100ML IV SOLN
10.0000 meq | INTRAVENOUS | Status: DC
  Administered 2019-03-24: 10 meq via INTRAVENOUS
  Filled 2019-03-24 (×3): qty 100

## 2019-03-24 MED ORDER — POTASSIUM CHLORIDE CRYS ER 20 MEQ PO TBCR
40.0000 meq | EXTENDED_RELEASE_TABLET | ORAL | Status: AC
  Administered 2019-03-24 (×2): 40 meq via ORAL
  Filled 2019-03-24 (×2): qty 2

## 2019-03-24 MED ORDER — FUROSEMIDE 40 MG PO TABS
40.0000 mg | ORAL_TABLET | Freq: Every day | ORAL | Status: DC
  Administered 2019-03-24 – 2019-03-31 (×8): 40 mg via ORAL
  Filled 2019-03-24 (×8): qty 1

## 2019-03-24 MED ORDER — FOLIC ACID 1 MG PO TABS
1.0000 mg | ORAL_TABLET | Freq: Every day | ORAL | Status: DC
  Administered 2019-03-24 – 2019-03-31 (×8): 1 mg via ORAL
  Filled 2019-03-24 (×8): qty 1

## 2019-03-24 MED ORDER — POTASSIUM CHLORIDE 20 MEQ PO PACK
40.0000 meq | PACK | Freq: Once | ORAL | Status: AC
  Administered 2019-03-24: 40 meq via ORAL
  Filled 2019-03-24: qty 2

## 2019-03-24 MED ORDER — DIPHENOXYLATE-ATROPINE 2.5-0.025 MG PO TABS
2.0000 | ORAL_TABLET | Freq: Three times a day (TID) | ORAL | Status: DC
  Administered 2019-03-24 – 2019-03-31 (×21): 2 via ORAL
  Filled 2019-03-24 (×21): qty 2

## 2019-03-24 NOTE — Progress Notes (Addendum)
PROGRESS NOTE    Kathy Lawson  [id number] DOB: March 07, 1961 DOA: 03/21/2019 PCP: Damita Dunnings, NP (Inactive)    Brief Narrative:  58 year old woman admitted with altered mental status and E. coli pyelonephritis and bacteremia gradually improving on IV antibiotics which have now been switched to p.o. antibiotics. Mentation continues to improve.   Assessment & Plan:  E. Coli bacteremia 2/2 E. Coli and Klebsiella Pyelonephritis -ID following -Switched from IV cefazolin to p.o. Keflex for another 7 total days of antibiotic therapy -f/u cultures -SNF placement pending as patient is likely medically stable for discharge  Acute Toxic Metabolic Encephalopathy, slowly improving -management as above -Neurology following w/ routine EEG consistent with toxic metabolic encephalopathy -continue to re-orient  Elevated liver enzymes, resolved --Abdominal u/s 10/31: suspected hepatic steatosis --LFTs normalized   Ventral abdominal hernia --bowel w/no strangulation or ischemic changes on imaging --Seen by general surgery at [facility] who recommended no immediate surgical intervention --Will need surgical follow up for hernia as outpatient  Acute respiratory insufficiency, resolved  --No bronchospasm on exam --CXR with atelectasis --goal O2 sats 88-92% given history of COPD  Acute renal failure, resolved  DVT prophylaxis: Heparin SQ Code Status:Full code   Consultants:   ID, Neurology, PCCM  Procedures:   n/a  Antimicrobials:  P.o. Keflex   Subjective: Patient denies any acute complaints today including fevers, chills, nausea, vomiting.  Nurse does note 6-8 bowel movements over the last 24 hours and the patient has not been receiving her home Lomitil given her history of Crohn's disease.  This medication will now be reordered.  Objective: Vitals:   03/24/19 0500 03/24/19 0745 03/24/19 1015 03/24/19 1419  BP: (!) 153/75 (!) 173/94 (!) 152/74 139/64   Pulse: 72 88 81 92  Resp: 18 15  16   Temp: 98.2 F (36.8 C) 98.1 F (36.7 C)  98.3 F (36.8 C)  TempSrc: Oral Oral  Oral  SpO2: 96% 95%  100%  Weight:        Intake/Output Summary (Last 24 hours) at 03/24/2019 1457 Last data filed at 03/24/2019 0920 Gross per 24 hour  Intake 360 ml  Output 575 ml  Net -215 ml   Filed Weights   03/21/19 0218 03/22/19 0500  Weight: 79.6 kg 80.1 kg    Examination:  General exam: Appears calm and comfortable  Respiratory system: Clear to auscultation. Respiratory effort normal. Cardiovascular system: S1 & S2 heard, RRR. No JVD, murmurs, rubs, gallops or clicks. No pedal edema. Gastrointestinal system: Abdomen is nondistended, soft and nontender. No organomegaly or masses felt. Normal bowel sounds heard. Central nervous system: Alert and oriented x 3. No focal neurological deficits. Slow to respond but responds appropriately. Extremities: Symmetric 5 x 5 power. Skin: Thin with small areas of ecchymosis on b/l UE  Scheduled Meds: . amLODipine  5 mg Oral Daily  . aspirin EC  81 mg Oral Daily  . atorvastatin  40 mg Oral q1800  . budesonide (PULMICORT) nebulizer solution  0.25 mg Nebulization BID  . cephALEXin  500 mg Oral Q6H  . Chlorhexidine Gluconate Cloth  6 each Topical Daily  . escitalopram  10 mg Oral Daily  . famotidine  20 mg Oral Daily  . heparin  5,000 Units Subcutaneous Q8H  . insulin aspart  0-9 Units Subcutaneous TID WC  . metoprolol tartrate  12.5 mg Oral BID  . mupirocin ointment  1 application Nasal BID  . pantoprazole  40 mg Oral Daily  . thiamine  100 mg Oral  Daily  . umeclidinium bromide  1 puff Inhalation Daily   Continuous Infusions: . sodium chloride Stopped (03/22/19 1757)  . potassium chloride 10 mEq (03/24/19 1355)     LOS: 3 days   Time spent: 25 mins  Charolotte Capuchin, MD [facility] Pager [phone]  If 7PM-7AM, please contact night-coverage [email or web address] Password [email or web address] 03/24/2019, 2:57 PM

## 2019-03-24 NOTE — Progress Notes (Signed)
PT Cancellation Note  Patient Details Name: Kathy Lawson MRN: [id number] DOB: 27-Nov-1960   Cancelled Treatment:    Reason Eval/Treat Not Completed: Other (comment).  States she is not willing to move off bed due to GI upset, and declined to do bed ex's.  Will try again at another time.   Ramond Dial 03/24/2019, 3:22 PM   Mee Hives, PT MS Acute Rehab Dept. Number: [place] and [place]

## 2019-03-24 NOTE — Plan of Care (Signed)
°  Problem: Respiratory: °Goal: Ability to maintain adequate ventilation will improve °Outcome: Progressing °  °

## 2019-03-24 NOTE — Progress Notes (Signed)
CRITICAL VALUE ALERT  Critical Value:  Potassium level=2.3  Date & Time Notied:  03/24/2019 @ 06:41  Provider Notified: Gaetana Michaelis 03/24/2019 @ 06:59  Orders Received/Actions taken: Order received in epic. Incoming RN made aware of new orders.

## 2019-03-25 LAB — BASIC METABOLIC PANEL
Anion gap: 11 (ref 5–15)
BUN: 7 mg/dL (ref 6–20)
CO2: 27 mmol/L (ref 22–32)
Calcium: 8.7 mg/dL — ABNORMAL LOW (ref 8.9–10.3)
Chloride: 105 mmol/L (ref 98–111)
Creatinine, Ser: 0.7 mg/dL (ref 0.44–1.00)
GFR calc Af Amer: >60 mL/min (ref >60)
GFR calc non Af Amer: >60 mL/min (ref >60)
Glucose, Bld: 146 mg/dL — ABNORMAL HIGH (ref 70–99)
Potassium: 3.1 mmol/L — ABNORMAL LOW (ref 3.5–5.1)
Sodium: 143 mmol/L (ref 135–145)

## 2019-03-25 LAB — GLUCOSE, CAPILLARY
Glucose-Capillary: 115 mg/dL — ABNORMAL HIGH (ref 70–99)
Glucose-Capillary: 153 mg/dL — ABNORMAL HIGH (ref 70–99)
Glucose-Capillary: 104 mg/dL — ABNORMAL HIGH (ref 70–99)
Glucose-Capillary: 122 mg/dL — ABNORMAL HIGH (ref 70–99)

## 2019-03-25 LAB — MAGNESIUM: Magnesium: 1.6 mg/dL — ABNORMAL LOW (ref 1.7–2.4)

## 2019-03-25 MED ORDER — POTASSIUM CHLORIDE 20 MEQ PO PACK
40.0000 meq | PACK | Freq: Once | ORAL | Status: AC
  Administered 2019-03-25: 40 meq via ORAL
  Filled 2019-03-25: qty 2

## 2019-03-25 NOTE — Progress Notes (Signed)
PROGRESS NOTE    Kathy Lawson  [id number] DOB: Nov 27, 1960 DOA: 03/21/2019 PCP: Damita Dunnings, NP (Inactive)    Brief Narrative:  58 year old woman admitted with altered mental status and E. coli pyelonephritis and bacteremia gradually improving on IV antibiotics which have now been switched to p.o. antibiotics. Mentation continues to improve and patient appears to be much closer to baseline now.   Assessment & Plan:  E. Coli bacteremia 2/2 E. Coli and Klebsiella Pyelonephritis -ID following -Switched from IV cefazolin to p.o. Keflex for another 7 total days of antibiotic therapy -f/u cultures -SNF placement pending as patient is medically stable for discharge  Acute Toxic Metabolic Encephalopathy, slowly improving -management as above -Neurology following w/ routine EEG consistent with toxic metabolic encephalopathy -continue to re-orient  Elevated liver enzymes, resolved --Abdominal u/s 10/31: suspected hepatic steatosis --LFTs normalized   Ventral abdominal hernia --bowel w/no strangulation or ischemic changes on imaging --Seen by general surgery at [facility] who recommended no immediate surgical intervention --Will need surgical follow up for hernia as outpatient  Acute respiratory insufficiency, resolved  --No bronchospasm on exam --CXR with atelectasis --goal O2 sats 88-92% given history of COPD  Acute renal failure, resolved  DVT prophylaxis: Heparin SQ Code Status:Full code   Consultants:   ID, Neurology, PCCM  Procedures:   n/a  Antimicrobials:  P.o. Keflex   Subjective: No acute complaints overnight.  Patient is doing well and appears to be back close to her baseline mental status.  She is pending SNF placement.  Objective: Vitals:   03/24/19 2207 03/25/19 0615 03/25/19 1129 03/25/19 1626  BP: 135/60 (!) 151/73 (!) 144/65 112/77  Pulse: 87 71 79 80  Resp: 16 18    Temp: 98.4 F (36.9 C) 98.3 F (36.8 C) 98.2 F (36.8  C) 98.4 F (36.9 C)  TempSrc: Oral  Oral Oral  SpO2: 99% 96% (!) 89% 94%  Weight:        Intake/Output Summary (Last 24 hours) at 03/25/2019 1629 Last data filed at 03/25/2019 0700 Gross per 24 hour  Intake 260 ml  Output 700 ml  Net -440 ml   Filed Weights   03/21/19 0218 03/22/19 0500  Weight: 79.6 kg 80.1 kg    Examination:  General exam: Appears calm and comfortable  Respiratory system: Clear to auscultation. Respiratory effort normal. Cardiovascular system: S1 & S2 heard, RRR. No JVD, murmurs, rubs, gallops or clicks. No pedal edema. Gastrointestinal system: Abdomen is nondistended, soft and nontender. No organomegaly or masses felt. Normal bowel sounds heard. Central nervous system: Alert and oriented x 3. No focal neurological deficits. Extremities: Symmetric 5 x 5 power. Skin: Thin with small areas of ecchymosis on b/l UE  Scheduled Meds:  amLODipine  5 mg Oral Daily   aspirin EC  81 mg Oral Daily   atorvastatin  40 mg Oral q1800   budesonide (PULMICORT) nebulizer solution  0.25 mg Nebulization BID   cephALEXin  500 mg Oral Q6H   Chlorhexidine Gluconate Cloth  6 each Topical Daily   diphenoxylate-atropine  2 tablet Oral TID AC   escitalopram  10 mg Oral Daily   famotidine  20 mg Oral Daily   folic acid  1 mg Oral Daily   furosemide  40 mg Oral Daily   heparin  5,000 Units Subcutaneous Q8H   insulin aspart  0-9 Units Subcutaneous TID WC   metoprolol tartrate  12.5 mg Oral BID   mupirocin ointment  1 application Nasal BID   pantoprazole  40 mg Oral Daily   thiamine  100 mg Oral Daily   umeclidinium bromide  1 puff Inhalation Daily   Continuous Infusions:  sodium chloride Stopped (03/22/19 1757)     LOS: 4 days   Time spent: 25 mins  Charolotte Capuchin, MD [facility] Pager [phone]  If 7PM-7AM, please contact night-coverage [email or web address] Password [facility] 03/25/2019, 4:29 PM

## 2019-03-25 NOTE — Plan of Care (Signed)
°  Problem: Respiratory: °Goal: Ability to maintain adequate ventilation will improve °Outcome: Progressing °  °

## 2019-03-25 NOTE — Progress Notes (Signed)
Physical Therapy Treatment Patient Details Name: Kathy Lawson MRN: [id number] DOB: 02-11-1961 Today's Date: 03/25/2019    History of Present Illness 58 yo female w/ PMH significant for HTN, HLD, CAD, MI, COPD, asthma, crohns disease, chronic anemia and hypothyroidism who was a transfer from [facility] hospital on 03/21/2019 after presenting there 03/17/2019 w/ pyelonephritis and E-coli bacteremia c/b worsening encephalopathy and concern for meningitis    PT Comments    Pt was seen for mobility and noted her new efforts to walk on side of bed.  Pt is having some unusual ideas, but with redirection became more aware of her situation.  Had the idea that her hair had extensions added in ICU but talked with her about combing her hair to remove tangles.  Pt is getting up to sidestep, more alert and is giving an effort that bodes well for recovery of independent gait in rehab.  Follow acutely for these needs, and anticipate dc to SNF and then home with help.   Follow Up Recommendations  SNF     Equipment Recommendations  None recommended by PT    Recommendations for Other Services       Precautions / Restrictions Precautions Precautions: Fall Precaution Comments: reports falls at home Restrictions Weight Bearing Restrictions: No    Mobility  Bed Mobility Overal bed mobility: Needs Assistance Bed Mobility: Supine to Sit;Sit to Supine     Supine to sit: Min assist Sit to supine: Min assist      Transfers Overall transfer level: Needs assistance Equipment used: Rolling walker (2 wheeled);1 person hand held assist Transfers: Sit to/from Stand Sit to Stand: Mod assist         General transfer comment: mod assist due to weakness but pt is using bedrail for support  Ambulation/Gait Ambulation/Gait assistance: Min assist Gait Distance (Feet): 25 Feet(in three trips) Assistive device: Rolling walker (2 wheeled);1 person hand held assist Gait Pattern/deviations: Step-to  pattern;Wide base of support Gait velocity: reduced Gait velocity interpretation: <1.31 ft/sec, indicative of household ambulator General Gait Details: sidesteps side of bed with support of knees on bed   Stairs             Wheelchair Mobility    Modified Rankin (Stroke Patients Only)       Balance Overall balance assessment: Needs assistance Sitting-balance support: Feet supported;Bilateral upper extremity supported Sitting balance-Leahy Scale: Fair     Standing balance support: Bilateral upper extremity supported;During functional activity Standing balance-Leahy Scale: Poor Standing balance comment: walker is supportive to control her weakness and balance changes                            Cognition Arousal/Alertness: Awake/alert Behavior During Therapy: Anxious Overall Cognitive Status: Impaired/Different from baseline Area of Impairment: Problem solving;Awareness;Safety/judgement;Following commands;Memory                   Current Attention Level: Selective Memory: Decreased recall of precautions;Decreased short-term memory Following Commands: Follows one step commands inconsistently;Follows one step commands with increased time Safety/Judgement: Decreased awareness of safety;Decreased awareness of deficits Awareness: Intellectual Problem Solving: Slow processing;Requires verbal cues General Comments: anxiety and unusual ideation       Exercises      General Comments General comments (skin integrity, edema, etc.): better after discussion of her unusual ideas, more aware of her situation and plan      Pertinent Vitals/Pain Pain Assessment: Faces Faces Pain Scale: Hurts a little bit Pain Location:  general stiffnesss Pain Descriptors / Indicators: Tightness Pain Intervention(s): Limited activity within patient's tolerance;Monitored during session;Repositioned    Home Living                      Prior Function             PT Goals (current goals can now be found in the care plan section) Acute Rehab PT Goals Patient Stated Goal: to get stronger Progress towards PT goals: Progressing toward goals    Frequency    Min 2X/week      PT Plan Current plan remains appropriate    Co-evaluation              AM-PAC PT "6 Clicks" Mobility   Outcome Measure  Help needed turning from your back to your side while in a flat bed without using bedrails?: A Little Help needed moving from lying on your back to sitting on the side of a flat bed without using bedrails?: A Little Help needed moving to and from a bed to a chair (including a wheelchair)?: A Little Help needed standing up from a chair using your arms (e.g., wheelchair or bedside chair)?: A Little Help needed to walk in hospital room?: A Little Help needed climbing 3-5 steps with a railing? : A Lot 6 Click Score: 17    End of Session Equipment Utilized During Treatment: Gait belt;Oxygen Activity Tolerance: Patient tolerated treatment well Patient left: in bed;with call bell/phone within reach;with bed alarm set Nurse Communication: Mobility status PT Visit Diagnosis: Unsteadiness on feet (R26.81);Other abnormalities of gait and mobility (R26.89);Muscle weakness (generalized) (M62.81);History of falling (Z91.81);Difficulty in walking, not elsewhere classified (R26.2);Pain Pain - part of body: Leg;Knee;Hip;Ankle and joints of foot     Time: 3845-3646 PT Time Calculation (min) (ACUTE ONLY): 41 min  Charges:  $Gait Training: 8-22 mins $Therapeutic Activity: 23-37 mins                    Ramond Dial 03/25/2019, 3:16 PM   Mee Hives, PT MS Acute Rehab Dept. Number: [place] and [place]

## 2019-03-26 LAB — CULTURE, BLOOD (ROUTINE X 2)
Culture: NO GROWTH
Culture: NO GROWTH
Special Requests: ADEQUATE
Special Requests: ADEQUATE

## 2019-03-26 LAB — GLUCOSE, CAPILLARY
Glucose-Capillary: 89 mg/dL (ref 70–99)
Glucose-Capillary: 110 mg/dL — ABNORMAL HIGH (ref 70–99)
Glucose-Capillary: 105 mg/dL — ABNORMAL HIGH (ref 70–99)
Glucose-Capillary: 118 mg/dL — ABNORMAL HIGH (ref 70–99)

## 2019-03-26 NOTE — Progress Notes (Signed)
PROGRESS NOTE  Kathy Lawson [id number] DOB: 1960/07/07 DOA: 03/21/2019 PCP: Damita Dunnings, NP (Inactive)  HPI/Recap of past 70 hours:  58 year old woman admitted with altered mental status and E. coli pyelonephritis with bacteremia she is improving on antibiotics it was switched from IV to p.o. and her mentation is close to baseline  Patient seen and examined at bedside complained that she has had diarrhea with blood times today about 3 this morning.  Patient is waiting for nursing home placement  Assessment/Plan: Principal Problem:   Pyelonephritis Active Problems:   Sepsis ([place])   Acute encephalopathy   Normocytic anemia   Hypomagnesemia   UTI (urinary tract infection)   E coli bacteremia  E. coli bacteremia secondary to pyelonephritis ID is following patient was switched from IV cefazolin to Keflex to complete a total of 7 days.  Blood cultures are negative x5 days  Acute toxic metabolic encephalopathy that is improving  Elevated liver enzyme have resolved ultrasound of the abdomen on March 19, 2019 was suspicious for hepatic steatosis.  I will recheck her LFTs  Code Status: Full  Severity of Illness: The appropriate patient status for this patient is INPATIENT. Inpatient status is judged to be reasonable and necessary in order to provide the required intensity of service to ensure the patient's safety. The patient's presenting symptoms, physical exam findings, and initial radiographic and laboratory data in the context of their chronic comorbidities is felt to place them at high risk for further clinical deterioration. Furthermore, it is not anticipated that the patient will be medically stable for discharge from the hospital within 2 midnights of admission. The following factors support the patient status of inpatient.   " The patient's presenting symptoms include patient still having diarrhea. " The worrisome physical exam findings include having diarrhea. "   " The chronic co-morbidities include COPD coronary artery disease.   * I certify that at the point of admission it is my clinical judgment that the patient will require inpatient hospital care spanning beyond 2 midnights from the point of admission due to high intensity of service, high risk for further deterioration and high frequency of surveillance required.*    Family Communication: None at bedside  Disposition Plan: SNF   Consultants:  Infectious disease  Procedures:  None  Antimicrobials:  Keflex  DVT prophylaxis: Subcu heparin   Objective: Vitals:   03/25/19 1934 03/25/19 2107 03/26/19 0415 03/26/19 0758  BP: (!) 144/85  134/60   Pulse: 86  77   Resp: 20  20   Temp: 98.5 F (36.9 C)  98.1 F (36.7 C)   TempSrc: Oral  Oral   SpO2: 93% 91% 94% 95%  Weight:   81 kg     Intake/Output Summary (Last 24 hours) at 03/26/2019 0844 Last data filed at 03/26/2019 0530 Gross per 24 hour  Intake 250 ml  Output 725 ml  Net -475 ml   Filed Weights   03/21/19 0218 03/22/19 0500 03/26/19 0415  Weight: 79.6 kg 80.1 kg 81 kg   Body mass index is 32.66 kg/m.  Exam:  . General: 58 y.o. year-old female well developed well nourished in no acute distress.  Alert and oriented x3.  Slightly obese no distress cardiovascular: Regular rate and rhythm with no rubs or gallops.  No thyromegaly or JVD noted.   Marland Kitchen Respiratory: Clear to auscultation with no wheezes or rales. Good inspiratory effort. . Abdomen: Soft nontender nondistended with normal bowel sounds x4 quadrants. . Musculoskeletal: No lower  extremity edema. 2/4 pulses in all 4 extremities. . Skin: No ulcerative lesions noted or rashes, . Psychiatry: Mood is appropriate for condition and setting    Data Reviewed: CBC: Recent Labs  Lab 03/21/19 0227  WBC 10.4  NEUTROABS 9.3*  HGB 9.9*  HCT 31.4*  MCV 94.6  PLT 119   Basic Metabolic Panel: Recent Labs  Lab 03/21/19 0227 03/21/19 1201 03/22/19 0233  03/24/19 0535 03/25/19 0934  NA 148* 147* 150* 147* 143  K 2.9* 3.7 2.8* 2.3* 3.1*  CL 111 112* 111 106 105  CO2 22 21* 25 30 27   GLUCOSE 173* 150* 174* 124* 146*  BUN 33* 33* 36* 12 7  CREATININE 1.52* 1.26* 1.13* 0.72 0.70  CALCIUM 9.5 9.6 10.0 9.0 8.7*  MG 2.0  --  2.0 1.8 1.6*  PHOS  --   --  2.0*  --   --    GFR: Estimated Creatinine Clearance: 74.7 mL/min (by C-G formula based on SCr of 0.7 mg/dL). Liver Function Tests: Recent Labs  Lab 03/21/19 0227 03/24/19 0535  AST 14* 16  ALT 16 10  ALKPHOS 149* 111  BILITOT 0.8 0.6  PROT 6.1* 6.1*  ALBUMIN 2.8* 2.8*   No results for input(s): LIPASE, AMYLASE in the last 168 hours. Recent Labs  Lab 03/21/19 0227  AMMONIA 44*   Coagulation Profile: No results for input(s): INR, PROTIME in the last 168 hours. Cardiac Enzymes: No results for input(s): CKTOTAL, CKMB, CKMBINDEX, TROPONINI in the last 168 hours. BNP (last 3 results) No results for input(s): PROBNP in the last 8760 hours. HbA1C: No results for input(s): HGBA1C in the last 72 hours. CBG: Recent Labs  Lab 03/25/19 0654 03/25/19 1244 03/25/19 1630 03/25/19 2052 03/26/19 0657  GLUCAP 115* 153* 104* 122* 89   Lipid Profile: No results for input(s): CHOL, HDL, LDLCALC, TRIG, CHOLHDL, LDLDIRECT in the last 72 hours. Thyroid Function Tests: No results for input(s): TSH, T4TOTAL, FREET4, T3FREE, THYROIDAB in the last 72 hours. Anemia Panel: No results for input(s): VITAMINB12, FOLATE, FERRITIN, TIBC, IRON, RETICCTPCT in the last 72 hours. Urine analysis:    Component Value Date/Time   COLORURINE YELLOW 03/21/2019 [place] 03/21/2019 0235   LABSPEC 1.020 03/21/2019 0235   PHURINE 6.0 03/21/2019 0235   GLUCOSEU NEGATIVE 03/21/2019 0235   HGBUR SMALL (A) 03/21/2019 0235   BILIRUBINUR NEGATIVE 03/21/2019 0235   KETONESUR 20 (A) 03/21/2019 0235   PROTEINUR 30 (A) 03/21/2019 0235   NITRITE NEGATIVE 03/21/2019 0235   LEUKOCYTESUR TRACE (A)  03/21/2019 0235   Sepsis Labs: @LABRCNTIP (procalcitonin:4,lacticidven:4)  ) Recent Results (from the past 240 hour(s))  Culture, blood (routine x 2)     Status: None   Collection Time: 03/21/19  2:26 AM   Specimen: BLOOD  Result Value Ref Range Status   Specimen Description BLOOD RIGHT ANTECUBITAL  Final   Special Requests   Final    BOTTLES DRAWN AEROBIC AND ANAEROBIC Blood Culture adequate volume   Culture   Final    NO GROWTH 5 DAYS Performed at [facility], [place] [address], [place], [place] [address]    Report Status 03/26/2019 FINAL  Final  Culture, blood (routine x 2)     Status: None   Collection Time: 03/21/19  2:27 AM   Specimen: BLOOD LEFT HAND  Result Value Ref Range Status   Specimen Description BLOOD LEFT HAND  Final   Special Requests   Final    BOTTLES DRAWN AEROBIC ONLY  Blood Culture adequate volume   Culture   Final    NO GROWTH 5 DAYS Performed at [facility], [place] [address], [place], [place] [address]    Report Status 03/26/2019 FINAL  Final  MRSA PCR Screening     Status: Abnormal   Collection Time: 03/22/19  5:02 AM   Specimen: Nasal Mucosa; Nasopharyngeal  Result Value Ref Range Status   MRSA by PCR POSITIVE (A) NEGATIVE Final    Comment:        The GeneXpert MRSA Assay (FDA approved for NASAL specimens only), is one component of a comprehensive MRSA colonization surveillance program. It is not intended to diagnose MRSA infection nor to guide or monitor treatment for MRSA infections. RESULT CALLED TO, READ BACK BY AND VERIFIED WITH: PHILLIPS,K RN 0630 03/22/2019 MITCHELL,L Performed at [facility], [place] [address] [place], [place] [address]       Studies: No results found.  Scheduled Meds: . amLODipine  5 mg Oral Daily  . aspirin EC  81 mg Oral Daily  . atorvastatin  40 mg Oral q1800  . budesonide (PULMICORT) nebulizer solution  0.25 mg Nebulization BID  . cephALEXin  500 mg Oral Q6H  . Chlorhexidine Gluconate  Cloth  6 each Topical Daily  . diphenoxylate-atropine  2 tablet Oral TID AC  . escitalopram  10 mg Oral Daily  . famotidine  20 mg Oral Daily  . folic acid  1 mg Oral Daily  . furosemide  40 mg Oral Daily  . heparin  5,000 Units Subcutaneous Q8H  . insulin aspart  0-9 Units Subcutaneous TID WC  . metoprolol tartrate  12.5 mg Oral BID  . mupirocin ointment  1 application Nasal BID  . pantoprazole  40 mg Oral Daily  . thiamine  100 mg Oral Daily  . umeclidinium bromide  1 puff Inhalation Daily    Continuous Infusions: . sodium chloride Stopped (03/22/19 1757)     LOS: 5 days     Cristal Deer, MD [facility]  To reach me or the doctor on call, go to: [email or web address] Password [facility]  03/26/2019, 8:44 AM

## 2019-03-27 LAB — CBC WITH DIFFERENTIAL/PLATELET
Abs Immature Granulocytes: 0.54 10*3/uL — ABNORMAL HIGH (ref 0.00–0.07)
Basophils Absolute: 0.1 10*3/uL (ref 0.0–0.1)
Basophils Relative: 0 %
Eosinophils Absolute: 0.5 10*3/uL (ref 0.0–0.5)
Eosinophils Relative: 4 %
HCT: 31.2 % — ABNORMAL LOW (ref 36.0–46.0)
Hemoglobin: 9.9 g/dL — ABNORMAL LOW (ref 12.0–15.0)
Immature Granulocytes: 4 %
Lymphocytes Relative: 17 %
Lymphs Abs: 2.2 10*3/uL (ref 0.7–4.0)
MCH: 30.5 pg (ref 26.0–34.0)
MCHC: 31.7 g/dL (ref 30.0–36.0)
MCV: 96 fL (ref 80.0–100.0)
Monocytes Absolute: 1 10*3/uL (ref 0.1–1.0)
Monocytes Relative: 8 %
Neutro Abs: 8.2 10*3/uL — ABNORMAL HIGH (ref 1.7–7.7)
Neutrophils Relative %: 67 %
Platelets: 487 10*3/uL — ABNORMAL HIGH (ref 150–400)
RBC: 3.25 MIL/uL — ABNORMAL LOW (ref 3.87–5.11)
RDW: 15.2 % (ref 11.5–15.5)
WBC: 12.5 10*3/uL — ABNORMAL HIGH (ref 4.0–10.5)
nRBC: 0 % (ref 0.0–0.2)

## 2019-03-27 LAB — VITAMIN B1: Vitamin B1 (Thiamine): 96.5 nmol/L (ref 66.5–200.0)

## 2019-03-27 LAB — GLUCOSE, CAPILLARY
Glucose-Capillary: 98 mg/dL (ref 70–99)
Glucose-Capillary: 117 mg/dL — ABNORMAL HIGH (ref 70–99)
Glucose-Capillary: 107 mg/dL — ABNORMAL HIGH (ref 70–99)
Glucose-Capillary: 178 mg/dL — ABNORMAL HIGH (ref 70–99)

## 2019-03-27 NOTE — Plan of Care (Signed)
  Problem: Fluid Volume: Goal: Hemodynamic stability will improve Outcome: Progressing   Problem: Clinical Measurements: Goal: Diagnostic test results will improve Outcome: Progressing Goal: Signs and symptoms of infection will decrease Outcome: Progressing   Problem: Respiratory: Goal: Ability to maintain adequate ventilation will improve Outcome: Progressing

## 2019-03-27 NOTE — NC FL2 (Signed)
[place] LEVEL OF CARE SCREENING TOOL     IDENTIFICATION  Patient Name: Kathy Lawson Birthdate: [birth year] Sex: female Admission Date (Current Location): 03/21/2019  [facility] and [place] Number:  Herbalist and Address:  [facility], [place] [address] [place], [place] [address]      Provider Number: [id number]  Attending Physician Name and Address:  Cristal Deer, MD  Relative Name and Phone Number:       Current Level of Care: Hospital Recommended Level of Care: [place] Prior Approval Number:    Date Approved/Denied:   PASRR Number: [id number]  Discharge Plan: SNF    Current Diagnoses: Patient Active Problem List   Diagnosis Date Noted  . E coli bacteremia 03/23/2019  . Pyelonephritis 03/23/2019  . Sepsis ([place]) 03/21/2019  . Acute encephalopathy 03/21/2019  . Normocytic anemia 03/21/2019  . Hypomagnesemia 03/21/2019  . UTI (urinary tract infection) 03/21/2019  . History of MI (myocardial infarction) 09/09/2018  . Hyponatremia 08/30/2018  . History of Crohn's colitis 08/29/2018  . C. difficile colitis 08/28/2018  . Toxic megacolon ([place]) 08/28/2018  . Crohn's disease ([place]) 08/28/2018  . CAD in native artery 07/29/2018  . Chronic combined systolic and diastolic CHF (congestive heart failure) ([place]) 07/29/2018  . Chronic ulcer of left lower extremity with fat layer exposed ([place]) 12/24/2015  . Allergic rhinoconjunctivitis 02/07/2015  . Sinusitis, chronic 02/07/2015  . Headache 02/07/2015  . GERD (gastroesophageal reflux disease) 02/07/2015  . Caffeine use 02/07/2015  . Tobacco abuse 02/07/2015  . History of COPD 02/07/2015  . Tobacco use disorder 10/04/2013  . Obstructive chronic bronchitis with exacerbation ([place]) 10/04/2013  . Hypertensive heart disease with heart failure ([place])   . Hyperlipemia   . Emphysema/COPD [place] C     Orientation RESPIRATION BLADDER Height & Weight     Self, Time,  Place, Situation  Normal External catheter, Incontinent(placed 11/2) Weight: 178 lb 9.2 oz (81 kg) Height:     BEHAVIORAL SYMPTOMS/MOOD NEUROLOGICAL BOWEL NUTRITION STATUS      Incontinent Diet(heart healthy)  AMBULATORY STATUS COMMUNICATION OF NEEDS Skin   Limited Assist Verbally Normal                       Personal Care Assistance Level of Assistance  Feeding, Dressing, Bathing Bathing Assistance: Limited assistance Feeding assistance: Independent Dressing Assistance: Limited assistance     Functional Limitations Info  Sight, Speech, Hearing Sight Info: Adequate Hearing Info: Adequate Speech Info: Adequate    SPECIAL CARE FACTORS FREQUENCY  PT (By licensed PT), OT (By licensed OT)     PT Frequency: 5x OT Frequency: 5x            Contractures Contractures Info: Not present    Additional Factors Info  Code Status, Allergies Code Status Info: Full Code Allergies Info: Lasix (Furosemide), Other, Latex           Current Medications (03/27/2019):  This is the current hospital active medication list Current Facility-Administered Medications  Medication Dose Route Frequency Provider Last Rate Last Dose  . 0.9 %  sodium chloride infusion   Intravenous PRN Renee Pain, MD   Stopped at 03/22/19 1757  . amLODipine (NORVASC) tablet 5 mg  5 mg Oral Daily Paulita Fujita B, NP   5 mg at 03/27/19 1036  . aspirin EC tablet 81 mg  81 mg Oral Daily Paulita Fujita B, NP   81 mg at 03/27/19 1036  . atorvastatin (  LIPITOR) tablet 40 mg  40 mg Oral q1800 Tonye Royalty, NP   40 mg at 03/26/19 1659  . budesonide (PULMICORT) nebulizer solution 0.25 mg  0.25 mg Nebulization BID Paulita Fujita B, NP   0.25 mg at 03/27/19 0753  . cephALEXin (KEFLEX) capsule 500 mg  500 mg Oral Q6H Dixon, Melton Krebs, NP   500 mg at 03/27/19 0656  . Chlorhexidine Gluconate Cloth 2 % PADS 6 each  6 each Topical Daily Renee Pain, MD   6 each at 03/26/19 1134  . diphenoxylate-atropine  (LOMOTIL) 2.5-0.025 MG per tablet 2 tablet  2 tablet Oral TID AC Charolotte Capuchin, MD   2 tablet at 03/27/19 0656  . escitalopram (LEXAPRO) tablet 10 mg  10 mg Oral Daily Tonye Royalty, NP   10 mg at 03/27/19 1037  . famotidine (PEPCID) tablet 20 mg  20 mg Oral Daily Tonye Royalty, NP   20 mg at 03/27/19 1036  . folic acid (FOLVITE) tablet 1 mg  1 mg Oral Daily Theresa Duty, Anurag, MD   1 mg at 03/27/19 1037  . furosemide (LASIX) tablet 40 mg  40 mg Oral Daily Charolotte Capuchin, MD   40 mg at 03/27/19 1036  . heparin injection 5,000 Units  5,000 Units Subcutaneous Q8H Tonye Royalty, NP   5,000 Units at 03/27/19 0656  . insulin aspart (novoLOG) injection 0-9 Units  0-9 Units Subcutaneous TID WC Paulita Fujita B, NP   2 Units at 03/25/19 1253  . ipratropium-albuterol (DUONEB) 0.5-2.5 (3) MG/3ML nebulizer solution 3 mL  3 mL Nebulization Q6H PRN Tonye Royalty, NP      . metoprolol tartrate (LOPRESSOR) tablet 12.5 mg  12.5 mg Oral BID Paulita Fujita B, NP   12.5 mg at 03/27/19 1037  . ondansetron (ZOFRAN) injection 4 mg  4 mg Intravenous Q8H PRN Charolotte Capuchin, MD   4 mg at 03/22/19 0904  . oxyCODONE (Oxy IR/ROXICODONE) immediate release tablet 5 mg  5 mg Oral Q4H PRN Anders Simmonds, MD   5 mg at 03/26/19 2250  . pantoprazole (PROTONIX) EC tablet 40 mg  40 mg Oral Daily Charolotte Capuchin, MD   40 mg at 03/27/19 1037  . thiamine (VITAMIN B-1) tablet 100 mg  100 mg Oral Daily Theresa Duty, Anurag, MD   100 mg at 03/27/19 1037  . umeclidinium bromide (INCRUSE ELLIPTA) 62.5 MCG/INH 1 puff  1 puff Inhalation Daily Renee Pain, MD   1 puff at 03/27/19 0753  . white petrolatum (VASELINE) gel   Topical PRN Renee Pain, MD         Discharge Medications: Please see discharge summary for a list of discharge medications.  Relevant Imaging Results:  Relevant Lab Results:   Additional Information SSN:[id number]  Gerrianne Scale Shirly Bartosiewicz, LCSW

## 2019-03-27 NOTE — Progress Notes (Signed)
PROGRESS NOTE  Kathy Lawson CWC:[id number] DOB: 1960-10-10 DOA: 03/21/2019 PCP: Damita Dunnings, NP (Inactive)  HPI/Recap of past 59 hours:  58 year old woman admitted with altered mental status and E. coli pyelonephritis with bacteremia she is improving on antibiotics it was switched from IV to p.o. and her mentation is close to baseline  Patient seen and examined at bedside complained that she has had diarrhea with blood times today about 3 this morning.  Patient is waiting for nursing home placement  Subjective, March 27, 2019: Patient seen and examined at bedside she denies any complaints she was on the phone with her sister.  I spoke with her sister Patsy patient is awaiting nursing home placement  Assessment/Plan: Principal Problem:   Pyelonephritis Active Problems:   Sepsis ([place])   Acute encephalopathy   Normocytic anemia   Hypomagnesemia   UTI (urinary tract infection)   E coli bacteremia  E. coli bacteremia secondary to pyelonephritis ID is following patient was switched from IV cefazolin to Keflex to complete a total of 7 days.  Blood cultures are negative x5 days  Acute toxic metabolic encephalopathy that is improving  Elevated liver enzyme have resolved ultrasound of the abdomen on March 19, 2019 was suspicious for hepatic steatosis.  I will recheck her LFTs  Code Status: Full  Severity of Illness: The appropriate patient status for this patient is INPATIENT. Inpatient status is judged to be reasonable and necessary in order to provide the required intensity of service to ensure the patient's safety. The patient's presenting symptoms, physical exam findings, and initial radiographic and laboratory data in the context of their chronic comorbidities is felt to place them at high risk for further clinical deterioration. Furthermore, it is not anticipated that the patient will be medically stable for discharge from the hospital within 2 midnights of admission. The  following factors support the patient status of inpatient.   " The patient's presenting symptoms include patient still having diarrhea. " The worrisome physical exam findings include having diarrhea. "  " The chronic co-morbidities include COPD coronary artery disease.   * I certify that at the point of admission it is my clinical judgment that the patient will require inpatient hospital care spanning beyond 2 midnights from the point of admission due to high intensity of service, high risk for further deterioration and high frequency of surveillance required.*    Family Communication: Spoke with Sister  Patsy on the phone  Disposition Plan: SNF   Consultants:  Infectious disease  Procedures:  None  Antimicrobials:  Keflex  DVT prophylaxis: Subcu heparin   Objective: Vitals:   03/27/19 0500 03/27/19 0606 03/27/19 0754 03/27/19 0918  BP:  137/66  130/77  Pulse:  70  85  Resp:  18  18  Temp:  98.3 F (36.8 C)  98.6 F (37 C)  TempSrc:  Oral  Oral  SpO2:  92% 95% 94%  Weight: 81 kg       Intake/Output Summary (Last 24 hours) at 03/27/2019 1137 Last data filed at 03/27/2019 1134 Gross per 24 hour  Intake 480 ml  Output 850 ml  Net -370 ml   Filed Weights   03/22/19 0500 03/26/19 0415 03/27/19 0500  Weight: 80.1 kg 81 kg 81 kg   Body mass index is 32.66 kg/m.  Exam:  . General: 58 y.o. year-old female well developed well nourished in no acute distress.  Alert and oriented x3.  Slightly obese no distress cardiovascular: Regular rate and rhythm with  no rubs or gallops.  No thyromegaly or JVD noted.   Marland Kitchen Respiratory: Clear to auscultation with no wheezes or rales. Good inspiratory effort. . Abdomen: Soft nontender nondistended with normal bowel sounds x4 quadrants. . Musculoskeletal: No lower extremity edema. 2/4 pulses in all 4 extremities. . Skin: No ulcerative lesions noted or rashes, . Psychiatry: Mood is appropriate for condition and setting    Data  Reviewed: CBC: Recent Labs  Lab 03/21/19 0227 03/27/19 0339  WBC 10.4 12.5*  NEUTROABS 9.3* 8.2*  HGB 9.9* 9.9*  HCT 31.4* 31.2*  MCV 94.6 96.0  PLT 388 932*   Basic Metabolic Panel: Recent Labs  Lab 03/21/19 0227 03/21/19 1201 03/22/19 0233 03/24/19 0535 03/25/19 0934  NA 148* 147* 150* 147* 143  K 2.9* 3.7 2.8* 2.3* 3.1*  CL 111 112* 111 106 105  CO2 22 21* 25 30 27   GLUCOSE 173* 150* 174* 124* 146*  BUN 33* 33* 36* 12 7  CREATININE 1.52* 1.26* 1.13* 0.72 0.70  CALCIUM 9.5 9.6 10.0 9.0 8.7*  MG 2.0  --  2.0 1.8 1.6*  PHOS  --   --  2.0*  --   --    GFR: Estimated Creatinine Clearance: 74.7 mL/min (by C-G formula based on SCr of 0.7 mg/dL). Liver Function Tests: Recent Labs  Lab 03/21/19 0227 03/24/19 0535  AST 14* 16  ALT 16 10  ALKPHOS 149* 111  BILITOT 0.8 0.6  PROT 6.1* 6.1*  ALBUMIN 2.8* 2.8*   No results for input(s): LIPASE, AMYLASE in the last 168 hours. Recent Labs  Lab 03/21/19 0227  AMMONIA 44*   Coagulation Profile: No results for input(s): INR, PROTIME in the last 168 hours. Cardiac Enzymes: No results for input(s): CKTOTAL, CKMB, CKMBINDEX, TROPONINI in the last 168 hours. BNP (last 3 results) No results for input(s): PROBNP in the last 8760 hours. HbA1C: No results for input(s): HGBA1C in the last 72 hours. CBG: Recent Labs  Lab 03/26/19 0657 03/26/19 1123 03/26/19 1645 03/26/19 2117 03/27/19 0650  GLUCAP 89 110* 105* 118* 98   Lipid Profile: No results for input(s): CHOL, HDL, LDLCALC, TRIG, CHOLHDL, LDLDIRECT in the last 72 hours. Thyroid Function Tests: No results for input(s): TSH, T4TOTAL, FREET4, T3FREE, THYROIDAB in the last 72 hours. Anemia Panel: No results for input(s): VITAMINB12, FOLATE, FERRITIN, TIBC, IRON, RETICCTPCT in the last 72 hours. Urine analysis:    Component Value Date/Time   COLORURINE YELLOW 03/21/2019 [place] 03/21/2019 0235   LABSPEC 1.020 03/21/2019 0235   PHURINE 6.0  03/21/2019 0235   GLUCOSEU NEGATIVE 03/21/2019 0235   HGBUR SMALL (A) 03/21/2019 0235   BILIRUBINUR NEGATIVE 03/21/2019 0235   KETONESUR 20 (A) 03/21/2019 0235   PROTEINUR 30 (A) 03/21/2019 0235   NITRITE NEGATIVE 03/21/2019 0235   LEUKOCYTESUR TRACE (A) 03/21/2019 0235   Sepsis Labs: @LABRCNTIP (procalcitonin:4,lacticidven:4)  ) Recent Results (from the past 240 hour(s))  Culture, blood (routine x 2)     Status: None   Collection Time: 03/21/19  2:26 AM   Specimen: BLOOD  Result Value Ref Range Status   Specimen Description BLOOD RIGHT ANTECUBITAL  Final   Special Requests   Final    BOTTLES DRAWN AEROBIC AND ANAEROBIC Blood Culture adequate volume   Culture   Final    NO GROWTH 5 DAYS Performed at [facility], [address], [place], [place] [address]    Report Status 03/26/2019 FINAL  Final  Culture, blood (routine x 2)  Status: None   Collection Time: 03/21/19  2:27 AM   Specimen: BLOOD LEFT HAND  Result Value Ref Range Status   Specimen Description BLOOD LEFT HAND  Final   Special Requests   Final    BOTTLES DRAWN AEROBIC ONLY Blood Culture adequate volume   Culture   Final    NO GROWTH 5 DAYS Performed at [facility], [address], [place], [place] [address]    Report Status 03/26/2019 FINAL  Final  MRSA PCR Screening     Status: Abnormal   Collection Time: 03/22/19  5:02 AM   Specimen: Nasal Mucosa; Nasopharyngeal  Result Value Ref Range Status   MRSA by PCR POSITIVE (A) NEGATIVE Final    Comment:        The GeneXpert MRSA Assay (FDA approved for NASAL specimens only), is one component of a comprehensive MRSA colonization surveillance program. It is not intended to diagnose MRSA infection nor to guide or monitor treatment for MRSA infections. RESULT CALLED TO, READ BACK BY AND VERIFIED WITH: PHILLIPS,K RN 0630 03/22/2019 MITCHELL,L Performed at [facility], [place] [address] [place], [place] [address]       Studies: No results  found.  Scheduled Meds: . amLODipine  5 mg Oral Daily  . aspirin EC  81 mg Oral Daily  . atorvastatin  40 mg Oral q1800  . budesonide (PULMICORT) nebulizer solution  0.25 mg Nebulization BID  . cephALEXin  500 mg Oral Q6H  . Chlorhexidine Gluconate Cloth  6 each Topical Daily  . diphenoxylate-atropine  2 tablet Oral TID AC  . escitalopram  10 mg Oral Daily  . famotidine  20 mg Oral Daily  . folic acid  1 mg Oral Daily  . furosemide  40 mg Oral Daily  . heparin  5,000 Units Subcutaneous Q8H  . insulin aspart  0-9 Units Subcutaneous TID WC  . metoprolol tartrate  12.5 mg Oral BID  . pantoprazole  40 mg Oral Daily  . thiamine  100 mg Oral Daily  . umeclidinium bromide  1 puff Inhalation Daily    Continuous Infusions: . sodium chloride Stopped (03/22/19 1757)     LOS: 6 days     Cristal Deer, MD [facility]  To reach me or the doctor on call, go to: [email or web address] Password [facility]  03/27/2019, 11:37 AM

## 2019-03-27 NOTE — TOC Initial Note (Signed)
Transition of Care [facility]) - Initial/Assessment Note    Patient Details  Name: Kathy Lawson MRN: [id number] Date of Birth: January 28, 1961  [facility]) CM/SW Contact:    Eileen Stanford, LCSW Phone Number: 03/27/2019, 11:29 AM  Clinical Narrative:  Pt is alert and oriented. Pt states she is agreeable to SNF and states "I prefer to go back to [facility]." Pt has been to the SNF recently. CSW to follow up with SNF.                 Expected Discharge Plan: Skilled Nursing Facility Barriers to Discharge: Continued Medical Work up   Patient Goals and CMS Choice Patient states their goals for this hospitalization and ongoing recovery are:: "to go back to [place]"   Choice offered to / list presented to : NA  Expected Discharge Plan and Services Expected Discharge Plan: [place] In-house Referral: Clinical Social Work     Living arrangements for the past 2 months: Single Family Home                                      Prior Living Arrangements/Services Living arrangements for the past 2 months: Single Family Home Lives with:: Adult Children Patient language and need for interpreter reviewed:: Yes Do you feel safe going back to the place where you live?: Yes      Need for Family Participation in Patient Care: Yes (Comment) Care giver support system in place?: Yes (comment)   Criminal Activity/Legal Involvement Pertinent to Current Situation/Hospitalization: No - Comment as needed  Activities of Daily Living      Permission Sought/Granted Permission sought to share information with : Investment banker, corporate granted to share info w AGENCY: [place] [facility]        Emotional Assessment Appearance:: Appears stated age Attitude/Demeanor/Rapport: Engaged Affect (typically observed): Accepting, Appropriate, Calm Orientation: : Oriented to Situation, Oriented to  Time, Oriented to Place, Oriented to  Self Alcohol / Substance Use: Not Applicable Psych Involvement: No (comment)  Admission diagnosis:  SEPSIS BACTEREMIA MRSA Patient Active Problem List   Diagnosis Date Noted  . E coli bacteremia 03/23/2019  . Pyelonephritis 03/23/2019  . Sepsis ([place]) 03/21/2019  . Acute encephalopathy 03/21/2019  . Normocytic anemia 03/21/2019  . Hypomagnesemia 03/21/2019  . UTI (urinary tract infection) 03/21/2019  . History of MI (myocardial infarction) 09/09/2018  . Hyponatremia 08/30/2018  . History of Crohn's colitis 08/29/2018  . C. difficile colitis 08/28/2018  . Toxic megacolon ([place]) 08/28/2018  . Crohn's disease ([place]) 08/28/2018  . CAD in native artery 07/29/2018  . Chronic combined systolic and diastolic CHF (congestive heart failure) ([place]) 07/29/2018  . Chronic ulcer of left lower extremity with fat layer exposed ([place]) 12/24/2015  . Allergic rhinoconjunctivitis 02/07/2015  . Sinusitis, chronic 02/07/2015  . Headache 02/07/2015  . GERD (gastroesophageal reflux disease) 02/07/2015  . Caffeine use 02/07/2015  . Tobacco abuse 02/07/2015  . History of COPD 02/07/2015  . Tobacco use disorder 10/04/2013  . Obstructive chronic bronchitis with exacerbation ([place]) 10/04/2013  . Hypertensive heart disease with heart failure ([place])   . Hyperlipemia   . Emphysema/COPD [place]    PCP:  Damita Dunnings, NP (Inactive) Pharmacy:   [id number]- [place], [place] - [address] [place]  [place] [address] Phone: [phone] Fax: [phone]     Social Determinants of Health (SDOH) Interventions    Readmission Risk Interventions Readmission Risk Prevention Plan 09/02/2018  Post Dischage Appt Complete  Medication Screening Complete  Transportation Screening Complete  Some recent data might be hidden

## 2019-03-28 LAB — GLUCOSE, CAPILLARY
Glucose-Capillary: 106 mg/dL — ABNORMAL HIGH (ref 70–99)
Glucose-Capillary: 97 mg/dL (ref 70–99)
Glucose-Capillary: 122 mg/dL — ABNORMAL HIGH (ref 70–99)

## 2019-03-28 NOTE — Plan of Care (Signed)
  Problem: Clinical Measurements: Goal: Diagnostic test results will improve Outcome: Progressing   

## 2019-03-28 NOTE — Plan of Care (Signed)
  Problem: Fluid Volume: Goal: Hemodynamic stability will improve Outcome: Progressing   Problem: Clinical Measurements: Goal: Diagnostic test results will improve Outcome: Progressing Goal: Signs and symptoms of infection will decrease Outcome: Progressing   Problem: Respiratory: Goal: Ability to maintain adequate ventilation will improve Outcome: Progressing

## 2019-03-28 NOTE — Progress Notes (Signed)
Occupational Therapy Treatment Patient Details Name: Kathy Lawson MRN: [id number] DOB: 1961/02/22 Today's Date: 03/28/2019    History of present illness 58 yo female w/ PMH significant for HTN, HLD, CAD, MI, COPD, asthma, crohns disease, chronic anemia and hypothyroidism who was a transfer from [facility] on 03/21/2019 after presenting there 03/17/2019 w/ pyelonephritis and E-coli bacteremia c/b worsening encephalopathy and concern for meningitis   OT comments  Pt eager to get OOB and see if she can walk. Walked 2 laps around her room and then to the bathroom with RW, good technique and supervision for safety. Pt toileted, groomed at sink and performed dressing with set up to supervision. Updated d/c to home with [place], pt is in agreement.   Follow Up Recommendations  Home health OT    Equipment Recommendations  None recommended by OT    Recommendations for Other Services      Precautions / Restrictions Precautions Precautions: Fall       Mobility Bed Mobility Overal bed mobility: Modified Independent                Transfers   Equipment used: Rolling walker (2 wheeled) Transfers: Sit to/from Stand Sit to Stand: Supervision         General transfer comment: for safety, good technique    Balance Overall balance assessment: Needs assistance   Sitting balance-Leahy Scale: Good Sitting balance - Comments: no LOB with donning socks     Standing balance-Leahy Scale: Fair Standing balance comment: statically                           ADL either performed or assessed with clinical judgement   ADL Overall ADL's : Needs assistance/impaired     Grooming: Wash/dry hands;Standing;Supervision/safety       Lower Body Bathing: Supervison/ safety;Sit to/from stand   Upper Body Dressing : Set up;Sitting   Lower Body Dressing: Supervision/safety;Sit to/from stand   Toilet Transfer: Supervision/safety;Ambulation;BSC;RW   Toileting- Clothing  Manipulation and Hygiene: Supervision/safety;Sit to/from stand       Functional mobility during ADLs: Supervision/safety;Rolling walker(x 3 laps in her room)       Vision       Perception     Praxis      Cognition Arousal/Alertness: Awake/alert Behavior During Therapy: WFL for tasks assessed/performed Overall Cognitive Status: Within Functional Limits for tasks assessed                                          Exercises     Shoulder Instructions       General Comments      Pertinent Vitals/ Pain       Pain Assessment: No/denies pain  Home Living                                          Prior Functioning/Environment              Frequency  Min 2X/week        Progress Toward Goals  OT Goals(current goals can now be found in the care plan section)  Progress towards OT goals: Progressing toward goals  Acute Rehab OT Goals Patient Stated Goal: to go home OT Goal Formulation: With patient Time For Goal Achievement:  04/06/19 Potential to Achieve Goals: Good  Plan Discharge plan needs to be updated    Co-evaluation                 AM-PAC OT "6 Clicks" Daily Activity     Outcome Measure   Help from another person eating meals?: None Help from another person taking care of personal grooming?: A Little Help from another person toileting, which includes using toliet, bedpan, or urinal?: A Little Help from another person bathing (including washing, rinsing, drying)?: A Little Help from another person to put on and taking off regular upper body clothing?: None Help from another person to put on and taking off regular lower body clothing?: A Little 6 Click Score: 20    End of Session Equipment Utilized During Treatment: Gait belt;Rolling walker  OT Visit Diagnosis: Unsteadiness on feet (R26.81);Muscle weakness (generalized) (M62.81)   Activity Tolerance Patient tolerated treatment well   Patient Left in  chair;with call bell/phone within reach   Nurse Communication Other (comment)(need purewick replaced)        Time: 9937-1696 OT Time Calculation (min): 23 min  Charges: OT General Charges $OT Visit: 1 Visit OT Treatments $Self Care/Home Management : 23-37 mins  Nestor Lewandowsky, OTR/L Acute Rehabilitation Services Pager: [phone] Office: [phone]   Malka So 03/28/2019, 3:49 PM

## 2019-03-28 NOTE — Progress Notes (Signed)
PROGRESS NOTE  Kathy Lawson XTG:[id number] DOB: May 16, 1961 DOA: 03/21/2019 PCP: Damita Dunnings, NP (Inactive)  HPI/Recap of past 49 hours:  58 year old woman admitted with altered mental status and E. coli pyelonephritis with bacteremia she is improving on antibiotics it was switched from IV to p.o. and her mentation is close to baseline  Patient seen and examined at bedside complained that she has had diarrhea with blood times today about 3 this morning.  Patient is waiting for nursing home placement  Subjective, March 27, 2019: Patient seen and examined at bedside she denies any complaints she was on the phone with her sister.  I spoke with her sister Patsy patient is awaiting nursing home placement  Subjective March 28, 2019: Patient seen and examined at bedside.  Patient stated that she is doing a whole lot better she does not really want to go to rehab.  She stated that last week Wednesday and Thursday that she was not feeling very well she had a flareup of her Crohn's and was feeling very poorly and could not participate very well with PT.  She stated she got her strength back that she cannot even raise her leg up she is lying in bed and raising her leg up to to demonstrate for me.  Assessment/Plan: Principal Problem:   Pyelonephritis Active Problems:   Sepsis ([place])   Acute encephalopathy   Normocytic anemia   Hypomagnesemia   UTI (urinary tract infection)   E coli bacteremia  E. coli bacteremia secondary to pyelonephritis ID is following patient was switched from IV cefazolin to Keflex to complete a total of 7 days.  Blood cultures are negative x5 days  Acute toxic metabolic encephalopathy that is improving  Elevated liver enzyme have resolved ultrasound of the abdomen on March 19, 2019 was suspicious for hepatic steatosis.  I will recheck her LFTs  Code Status: Full  Severity of Illness: The appropriate patient status for this patient is INPATIENT. Inpatient  status is judged to be reasonable and necessary in order to provide the required intensity of service to ensure the patient's safety. The patient's presenting symptoms, physical exam findings, and initial radiographic and laboratory data in the context of their chronic comorbidities is felt to place them at high risk for further clinical deterioration. Furthermore, it is not anticipated that the patient will be medically stable for discharge from the hospital within 2 midnights of admission. The following factors support the patient status of inpatient.   " The patient's presenting symptoms include patient still having diarrhea. " The worrisome physical exam findings include having diarrhea. "  " The chronic co-morbidities include COPD coronary artery disease.   * I certify that at the point of admission it is my clinical judgment that the patient will require inpatient hospital care spanning beyond 2 midnights from the point of admission due to high intensity of service, high risk for further deterioration and high frequency of surveillance required.*    Family Communication: Spoke with Sister  Patsy on the phone  Disposition Plan: SNF   Consultants:  Infectious disease  Procedures:  None  Antimicrobials:  Keflex  DVT prophylaxis: Subcu heparin   Objective: Vitals:   03/28/19 0511 03/28/19 0840 03/28/19 1115 03/28/19 1618  BP: 129/62  128/64 119/69  Pulse: 82  82 77  Resp: 18  18 18   Temp: 98.3 F (36.8 C)  98.3 F (36.8 C) 98.4 F (36.9 C)  TempSrc: Oral  Oral Oral  SpO2: 95% 92% 93% 96%  Weight:        Intake/Output Summary (Last 24 hours) at 03/28/2019 1726 Last data filed at 03/28/2019 1435 Gross per 24 hour  Intake 360 ml  Output 1725 ml  Net -1365 ml   Filed Weights   03/27/19 0500 03/27/19 2117 03/28/19 0500  Weight: 81 kg 81 kg 81 kg   Body mass index is 32.66 kg/m.  Exam:  . General: 58 y.o. year-old female well developed well nourished in no  acute distress.  Alert and oriented x3.  Slightly obese no distress cardiovascular: Regular rate and rhythm with no rubs or gallops.  No thyromegaly or JVD noted.   Marland Kitchen Respiratory: Clear to auscultation with no wheezes or rales. Good inspiratory effort. . Abdomen: Soft nontender nondistended with normal bowel sounds x4 quadrants. . Musculoskeletal: No lower extremity edema. 2/4 pulses in all 4 extremities. . Skin: No ulcerative lesions noted or rashes, . Psychiatry: Mood is appropriate for condition and setting    Data Reviewed: CBC: Recent Labs  Lab 03/27/19 0339  WBC 12.5*  NEUTROABS 8.2*  HGB 9.9*  HCT 31.2*  MCV 96.0  PLT 892*   Basic Metabolic Panel: Recent Labs  Lab 03/22/19 0233 03/24/19 0535 03/25/19 0934  NA 150* 147* 143  K 2.8* 2.3* 3.1*  CL 111 106 105  CO2 25 30 27   GLUCOSE 174* 124* 146*  BUN 36* 12 7  CREATININE 1.13* 0.72 0.70  CALCIUM 10.0 9.0 8.7*  MG 2.0 1.8 1.6*  PHOS 2.0*  --   --    GFR: Estimated Creatinine Clearance: 74.7 mL/min (by C-G formula based on SCr of 0.7 mg/dL). Liver Function Tests: Recent Labs  Lab 03/24/19 0535  AST 16  ALT 10  ALKPHOS 111  BILITOT 0.6  PROT 6.1*  ALBUMIN 2.8*   No results for input(s): LIPASE, AMYLASE in the last 168 hours. No results for input(s): AMMONIA in the last 168 hours. Coagulation Profile: No results for input(s): INR, PROTIME in the last 168 hours. Cardiac Enzymes: No results for input(s): CKTOTAL, CKMB, CKMBINDEX, TROPONINI in the last 168 hours. BNP (last 3 results) No results for input(s): PROBNP in the last 8760 hours. HbA1C: No results for input(s): HGBA1C in the last 72 hours. CBG: Recent Labs  Lab 03/27/19 1611 03/27/19 2118 03/28/19 0646 03/28/19 1136 03/28/19 1617  GLUCAP 107* 178* 106* 97 122*   Lipid Profile: No results for input(s): CHOL, HDL, LDLCALC, TRIG, CHOLHDL, LDLDIRECT in the last 72 hours. Thyroid Function Tests: No results for input(s): TSH, T4TOTAL, FREET4,  T3FREE, THYROIDAB in the last 72 hours. Anemia Panel: No results for input(s): VITAMINB12, FOLATE, FERRITIN, TIBC, IRON, RETICCTPCT in the last 72 hours. Urine analysis:    Component Value Date/Time   COLORURINE YELLOW 03/21/2019 [place] 03/21/2019 0235   LABSPEC 1.020 03/21/2019 0235   PHURINE 6.0 03/21/2019 0235   GLUCOSEU NEGATIVE 03/21/2019 0235   HGBUR SMALL (A) 03/21/2019 0235   BILIRUBINUR NEGATIVE 03/21/2019 0235   KETONESUR 20 (A) 03/21/2019 0235   PROTEINUR 30 (A) 03/21/2019 0235   NITRITE NEGATIVE 03/21/2019 0235   LEUKOCYTESUR TRACE (A) 03/21/2019 0235   Sepsis Labs: @LABRCNTIP (procalcitonin:4,lacticidven:4)  ) Recent Results (from the past 240 hour(s))  Culture, blood (routine x 2)     Status: None   Collection Time: 03/21/19  2:26 AM   Specimen: BLOOD  Result Value Ref Range Status   Specimen Description BLOOD RIGHT ANTECUBITAL  Final   Special Requests   Final  BOTTLES DRAWN AEROBIC AND ANAEROBIC Blood Culture adequate volume   Culture   Final    NO GROWTH 5 DAYS Performed at [facility], [place] [address] [place], [place] [address]    Report Status 03/26/2019 FINAL  Final  Culture, blood (routine x 2)     Status: None   Collection Time: 03/21/19  2:27 AM   Specimen: BLOOD LEFT HAND  Result Value Ref Range Status   Specimen Description BLOOD LEFT HAND  Final   Special Requests   Final    BOTTLES DRAWN AEROBIC ONLY Blood Culture adequate volume   Culture   Final    NO GROWTH 5 DAYS Performed at [facility], [place] [address], [place], [place] [address]    Report Status 03/26/2019 FINAL  Final  MRSA PCR Screening     Status: Abnormal   Collection Time: 03/22/19  5:02 AM   Specimen: Nasal Mucosa; Nasopharyngeal  Result Value Ref Range Status   MRSA by PCR POSITIVE (A) NEGATIVE Final    Comment:        The GeneXpert MRSA Assay (FDA approved for NASAL specimens only), is one component of a comprehensive MRSA colonization  surveillance program. It is not intended to diagnose MRSA infection nor to guide or monitor treatment for MRSA infections. RESULT CALLED TO, READ BACK BY AND VERIFIED WITH: PHILLIPS,K RN 0630 03/22/2019 MITCHELL,L Performed at [facility], [place] [address] [place], [place] [address]       Studies: No results found.  Scheduled Meds: . amLODipine  5 mg Oral Daily  . aspirin EC  81 mg Oral Daily  . atorvastatin  40 mg Oral q1800  . budesonide (PULMICORT) nebulizer solution  0.25 mg Nebulization BID  . cephALEXin  500 mg Oral Q6H  . Chlorhexidine Gluconate Cloth  6 each Topical Daily  . diphenoxylate-atropine  2 tablet Oral TID AC  . escitalopram  10 mg Oral Daily  . famotidine  20 mg Oral Daily  . folic acid  1 mg Oral Daily  . furosemide  40 mg Oral Daily  . heparin  5,000 Units Subcutaneous Q8H  . insulin aspart  0-9 Units Subcutaneous TID WC  . metoprolol tartrate  12.5 mg Oral BID  . pantoprazole  40 mg Oral Daily  . thiamine  100 mg Oral Daily  . umeclidinium bromide  1 puff Inhalation Daily    Continuous Infusions: . sodium chloride Stopped (03/22/19 1757)     LOS: 7 days     Cristal Deer, MD [facility]  To reach me or the doctor on call, go to: [email or web address] Password [facility]  03/28/2019, 5:26 PM

## 2019-03-29 ENCOUNTER — Encounter (HOSPITAL_COMMUNITY): Payer: Self-pay | Admitting: General Practice

## 2019-03-29 ENCOUNTER — Other Ambulatory Visit: Payer: Self-pay

## 2019-03-29 ENCOUNTER — Inpatient Hospital Stay (HOSPITAL_COMMUNITY): Payer: Medicaid Other

## 2019-03-29 LAB — GLUCOSE, CAPILLARY
Glucose-Capillary: 107 mg/dL — ABNORMAL HIGH (ref 70–99)
Glucose-Capillary: 107 mg/dL — ABNORMAL HIGH (ref 70–99)
Glucose-Capillary: 130 mg/dL — ABNORMAL HIGH (ref 70–99)
Glucose-Capillary: 137 mg/dL — ABNORMAL HIGH (ref 70–99)

## 2019-03-29 NOTE — Plan of Care (Signed)
  Problem: Fluid Volume: Goal: Hemodynamic stability will improve Outcome: Progressing

## 2019-03-29 NOTE — Progress Notes (Signed)
PROGRESS NOTE  Kathy Lawson EKC:[id number] DOB: 1960/08/08 DOA: 03/21/2019 PCP: Damita Dunnings, NP (Inactive)  HPI/Recap of past 67 hours:  58 year old woman admitted with altered mental status and E. coli pyelonephritis with bacteremia she is improving on antibiotics it was switched from IV to p.o. and her mentation is close to baseline  Patient seen and examined at bedside complained that she has had diarrhea with blood times today about 3 this morning.  Patient is waiting for nursing home placement  Subjective, March 27, 2019: Patient seen and examined at bedside she denies any complaints she was on the phone with her sister.  I spoke with her sister Patsy patient is awaiting nursing home placement  Subjective March 28, 2019: Patient seen and examined at bedside.  Patient stated that she is doing a whole lot better she does not really want to go to rehab.  She stated that last week Wednesday and Thursday that she was not feeling very well she had a flareup of her Crohn's and was feeling very poorly and could not participate very well with PT.  She stated she got her strength back that she cannot even raise her leg up she is lying in bed and raising her leg up to to demonstrate for me.  Subjective March 29, 2019: Patient seen and examined at bedside.  She is much improved compared and more aware past 2 or 3 days.  She has made up her mind she does not want to go to SNF she says she has been there before.  She has home health wheelchair bedside commode and and walker.  She wanted to know if she can get home health physical therapy.  Also complaining of left knee pain and wanted to know if there is something wrong with it.  So I will order x-ray of the left knee.  Assessment/Plan: Principal Problem:   Pyelonephritis Active Problems:   Sepsis ([place])   Acute encephalopathy   Normocytic anemia   Hypomagnesemia   UTI (urinary tract infection)   E coli bacteremia  E. coli  bacteremia secondary to pyelonephritis ID is following patient was switched from IV cefazolin to Keflex to complete a total of 7 days.  Blood cultures are negative x5 days  Acute toxic metabolic encephalopathy that is improving  Elevated liver enzyme have resolved ultrasound of the abdomen on March 19, 2019 was suspicious for hepatic steatosis.  I will recheck her LFTs  Left knee pain will order x-ray  Code Status: Full  Severity of Illness: The appropriate patient status for this patient is INPATIENT. Inpatient status is judged to be reasonable and necessary in order to provide the required intensity of service to ensure the patient's safety. The patient's presenting symptoms, physical exam findings, and initial radiographic and laboratory data in the context of their chronic comorbidities is felt to place them at high risk for further clinical deterioration. Furthermore, it is not anticipated that the patient will be medically stable for discharge from the hospital within 2 midnights of admission. The following factors support the patient status of inpatient.   " The patient's presenting symptoms include patient still having diarrhea. " The worrisome physical exam findings include having diarrhea. "  " The chronic co-morbidities include COPD coronary artery disease.   * I certify that at the point of admission it is my clinical judgment that the patient will require inpatient hospital care spanning beyond 2 midnights from the point of admission due to high intensity of service,  high risk for further deterioration and high frequency of surveillance required.*    Family Communication: Spoke with Sister  Patsy on the phone  Disposition Plan: SNF patient is declining SNF and wants to be discharged home.  Likely discharged in the morning   Consultants:  Infectious disease  Procedures:  None  Antimicrobials:  Keflex  DVT prophylaxis: Subcu heparin   Objective: Vitals:    03/28/19 1927 03/28/19 2023 03/29/19 0539 03/29/19 0832  BP:  (!) 149/89 (!) 145/61   Pulse:  85 70   Resp:  16 16   Temp:  98.6 F (37 C) 98.2 F (36.8 C)   TempSrc:  Oral Oral   SpO2: 95% 94% 96% 96%  Weight:        Intake/Output Summary (Last 24 hours) at 03/29/2019 1024 Last data filed at 03/29/2019 0600 Gross per 24 hour  Intake 0 ml  Output 2050 ml  Net -2050 ml   Filed Weights   03/27/19 0500 03/27/19 2117 03/28/19 0500  Weight: 81 kg 81 kg 81 kg   Body mass index is 32.66 kg/m.  Exam:  . General: 58 y.o. year-old female well developed well nourished in no acute distress.  Alert and oriented x3.  Slightly obese no distress cardiovascular: Regular rate and rhythm with no rubs or gallops.  No thyromegaly or JVD noted.   Marland Kitchen Respiratory: Clear to auscultation with no wheezes or rales. Good inspiratory effort. . Abdomen: Soft nontender nondistended with normal bowel sounds x4 quadrants. . Musculoskeletal: No lower extremity edema. 2/4 pulses in all 4 extremities.  Left knee is not swollen . Skin: No ulcerative lesions noted or rashes, . Psychiatry: Mood is appropriate for condition and setting    Data Reviewed: CBC: Recent Labs  Lab 03/27/19 0339  WBC 12.5*  NEUTROABS 8.2*  HGB 9.9*  HCT 31.2*  MCV 96.0  PLT 676*   Basic Metabolic Panel: Recent Labs  Lab 03/24/19 0535 03/25/19 0934  NA 147* 143  K 2.3* 3.1*  CL 106 105  CO2 30 27  GLUCOSE 124* 146*  BUN 12 7  CREATININE 0.72 0.70  CALCIUM 9.0 8.7*  MG 1.8 1.6*   GFR: Estimated Creatinine Clearance: 74.7 mL/min (by C-G formula based on SCr of 0.7 mg/dL). Liver Function Tests: Recent Labs  Lab 03/24/19 0535  AST 16  ALT 10  ALKPHOS 111  BILITOT 0.6  PROT 6.1*  ALBUMIN 2.8*   No results for input(s): LIPASE, AMYLASE in the last 168 hours. No results for input(s): AMMONIA in the last 168 hours. Coagulation Profile: No results for input(s): INR, PROTIME in the last 168 hours. Cardiac  Enzymes: No results for input(s): CKTOTAL, CKMB, CKMBINDEX, TROPONINI in the last 168 hours. BNP (last 3 results) No results for input(s): PROBNP in the last 8760 hours. HbA1C: No results for input(s): HGBA1C in the last 72 hours. CBG: Recent Labs  Lab 03/27/19 2118 03/28/19 0646 03/28/19 1136 03/28/19 1617 03/29/19 0721  GLUCAP 178* 106* 97 122* 107*   Lipid Profile: No results for input(s): CHOL, HDL, LDLCALC, TRIG, CHOLHDL, LDLDIRECT in the last 72 hours. Thyroid Function Tests: No results for input(s): TSH, T4TOTAL, FREET4, T3FREE, THYROIDAB in the last 72 hours. Anemia Panel: No results for input(s): VITAMINB12, FOLATE, FERRITIN, TIBC, IRON, RETICCTPCT in the last 72 hours. Urine analysis:    Component Value Date/Time   COLORURINE YELLOW 03/21/2019 [place] 03/21/2019 0235   LABSPEC 1.020 03/21/2019 0235   PHURINE 6.0 03/21/2019 0235  GLUCOSEU NEGATIVE 03/21/2019 0235   HGBUR SMALL (A) 03/21/2019 0235   BILIRUBINUR NEGATIVE 03/21/2019 0235   KETONESUR 20 (A) 03/21/2019 0235   PROTEINUR 30 (A) 03/21/2019 0235   NITRITE NEGATIVE 03/21/2019 0235   LEUKOCYTESUR TRACE (A) 03/21/2019 0235   Sepsis Labs: @LABRCNTIP (procalcitonin:4,lacticidven:4)  ) Recent Results (from the past 240 hour(s))  Culture, blood (routine x 2)     Status: None   Collection Time: 03/21/19  2:26 AM   Specimen: BLOOD  Result Value Ref Range Status   Specimen Description BLOOD RIGHT ANTECUBITAL  Final   Special Requests   Final    BOTTLES DRAWN AEROBIC AND ANAEROBIC Blood Culture adequate volume   Culture   Final    NO GROWTH 5 DAYS Performed at [facility], [place] [address], [place], [place] [address]    Report Status 03/26/2019 FINAL  Final  Culture, blood (routine x 2)     Status: None   Collection Time: 03/21/19  2:27 AM   Specimen: BLOOD LEFT HAND  Result Value Ref Range Status   Specimen Description BLOOD LEFT HAND  Final   Special Requests   Final     BOTTLES DRAWN AEROBIC ONLY Blood Culture adequate volume   Culture   Final    NO GROWTH 5 DAYS Performed at [facility], [place] [address], [place], [place] [address]    Report Status 03/26/2019 FINAL  Final  MRSA PCR Screening     Status: Abnormal   Collection Time: 03/22/19  5:02 AM   Specimen: Nasal Mucosa; Nasopharyngeal  Result Value Ref Range Status   MRSA by PCR POSITIVE (A) NEGATIVE Final    Comment:        The GeneXpert MRSA Assay (FDA approved for NASAL specimens only), is one component of a comprehensive MRSA colonization surveillance program. It is not intended to diagnose MRSA infection nor to guide or monitor treatment for MRSA infections. RESULT CALLED TO, READ BACK BY AND VERIFIED WITH: PHILLIPS,K RN 0630 03/22/2019 MITCHELL,L Performed at [facility], [place] [address] [place], [place] [address]       Studies: No results found.  Scheduled Meds: . amLODipine  5 mg Oral Daily  . aspirin EC  81 mg Oral Daily  . atorvastatin  40 mg Oral q1800  . budesonide (PULMICORT) nebulizer solution  0.25 mg Nebulization BID  . cephALEXin  500 mg Oral Q6H  . Chlorhexidine Gluconate Cloth  6 each Topical Daily  . diphenoxylate-atropine  2 tablet Oral TID AC  . escitalopram  10 mg Oral Daily  . famotidine  20 mg Oral Daily  . folic acid  1 mg Oral Daily  . furosemide  40 mg Oral Daily  . heparin  5,000 Units Subcutaneous Q8H  . insulin aspart  0-9 Units Subcutaneous TID WC  . metoprolol tartrate  12.5 mg Oral BID  . pantoprazole  40 mg Oral Daily  . thiamine  100 mg Oral Daily  . umeclidinium bromide  1 puff Inhalation Daily    Continuous Infusions: . sodium chloride Stopped (03/22/19 1757)     LOS: 8 days     Cristal Deer, MD [facility]  To reach me or the doctor on call, go to: [email or web address] Password [email or web address]  03/29/2019, 10:24 AM

## 2019-03-29 NOTE — TOC Progression Note (Addendum)
Transition of Care [facility]) - Progression Note    Patient Details  Name: Kathy Lawson MRN: [id number] Date of Birth: 1960-08-15  [facility] CM/SW Contact  Bartholomew Crews, RN Phone Number: [phone] 03/29/2019, 3:53 PM  Clinical Narrative:    NCM and CSW spoke with patient at the bedside to discuss transition home. Patient does not want to go to a nursing home. Discussed expedited process for PCS. Patient in agreement. Verified demographic information in Epic. Patient requested to call her son, Ozzie Hoyle - voice mail left. Cliff responded to voice mail and was updated on transition plan by NCM. Will fax PCS form to expedited line at [facility]. [facility] following for transitions.   Update: PCS form signed by MD. Virgel Gess to [employer] by [employer].    Expected Discharge Plan: [place] Barriers to Discharge: Continued Medical Work up  Expected Discharge Plan and Services Expected Discharge Plan: [place] In-house Referral: Clinical Social Work     Living arrangements for the past 2 months: Single Family Home                                       Social Determinants of Health (SDOH) Interventions    Readmission Risk Interventions Readmission Risk Prevention Plan 09/02/2018  Post Dischage Appt Complete  Medication Screening Complete  Transportation Screening Complete  Some recent data might be hidden

## 2019-03-29 NOTE — Progress Notes (Signed)
Physical Therapy Treatment Patient Details Name: Kathy Lawson MRN: [id number] DOB: 06/18/1960 Today's Date: 03/29/2019    History of Present Illness 58 yo female w/ PMH significant for HTN, HLD, CAD, MI, COPD, asthma, crohns disease, chronic anemia and hypothyroidism who was a transfer from [facility] on 03/21/2019 after presenting there 03/17/2019 w/ pyelonephritis and E-coli bacteremia c/b worsening encephalopathy and concern for meningitis    PT Comments    Pt is unable to walk this afternoon, reports she just got up with nursing and is tired. Has an expectation of going directly home but with her general limited mobility have concerns.  Has a large number of steps to get in the house, and will need to demonstrate this ability to change PT recommendation to go to rehab setting first.   Follow Up Recommendations  SNF     Equipment Recommendations  None recommended by PT    Recommendations for Other Services       Precautions / Restrictions Precautions Precautions: Fall Precaution Comments: reports falls at home Restrictions Weight Bearing Restrictions: No    Mobility  Bed Mobility               General bed mobility comments: declined OOB  Transfers                    Ambulation/Gait                 Stairs             Wheelchair Mobility    Modified Rankin (Stroke Patients Only)       Balance                                            Cognition Arousal/Alertness: Awake/alert Behavior During Therapy: WFL for tasks assessed/performed Overall Cognitive Status: Within Functional Limits for tasks assessed                                        Exercises General Exercises - Lower Extremity Ankle Circles/Pumps: AROM;5 reps Quad Sets: AROM;10 reps Gluteal Sets: AROM;10 reps Heel Slides: AROM;10 reps Hip ABduction/ADduction: AROM;10 reps Straight Leg Raises: AROM;10 reps    General  Comments        Pertinent Vitals/Pain Pain Assessment: No/denies pain    Home Living                      Prior Function            PT Goals (current goals can now be found in the care plan section) Acute Rehab PT Goals Patient Stated Goal: to go home Progress towards PT goals: Progressing toward goals    Frequency    Min 2X/week      PT Plan Current plan remains appropriate    Co-evaluation              AM-PAC PT "6 Clicks" Mobility   Outcome Measure  Help needed turning from your back to your side while in a flat bed without using bedrails?: A Little Help needed moving from lying on your back to sitting on the side of a flat bed without using bedrails?: A Little Help needed moving to and from a bed to a chair (including a wheelchair)?:  A Little Help needed standing up from a chair using your arms (e.g., wheelchair or bedside chair)?: A Little Help needed to walk in hospital room?: A Little Help needed climbing 3-5 steps with a railing? : A Lot 6 Click Score: 17    End of Session Equipment Utilized During Treatment: Oxygen Activity Tolerance: Patient tolerated treatment well Patient left: in bed;with call bell/phone within reach;with bed alarm set Nurse Communication: Mobility status PT Visit Diagnosis: Unsteadiness on feet (R26.81);Other abnormalities of gait and mobility (R26.89);Muscle weakness (generalized) (M62.81);History of falling (Z91.81);Difficulty in walking, not elsewhere classified (R26.2);Pain     Time: 0349-6116 PT Time Calculation (min) (ACUTE ONLY): 21 min  Charges:  $Therapeutic Exercise: 8-22 mins                Ramond Dial 03/29/2019, 5:37 PM   Mee Hives, PT MS Acute Rehab Dept. Number: [place] [facility] [place]

## 2019-03-30 ENCOUNTER — Inpatient Hospital Stay (HOSPITAL_COMMUNITY): Payer: Medicaid Other

## 2019-03-30 DIAGNOSIS — A4151 Sepsis due to Escherichia coli [E. coli]: Principal | ICD-10-CM

## 2019-03-30 DIAGNOSIS — R7881 Bacteremia: Secondary | ICD-10-CM

## 2019-03-30 DIAGNOSIS — R6520 Severe sepsis without septic shock: Secondary | ICD-10-CM

## 2019-03-30 LAB — URINALYSIS, ROUTINE W REFLEX MICROSCOPIC
Bilirubin Urine: NEGATIVE
Glucose, UA: NEGATIVE mg/dL
Ketones, ur: NEGATIVE mg/dL
Nitrite: NEGATIVE
Protein, ur: NEGATIVE mg/dL
Specific Gravity, Urine: 1.006 (ref 1.005–1.030)
pH: 7 (ref 5.0–8.0)

## 2019-03-30 LAB — CBC WITH DIFFERENTIAL/PLATELET
Abs Immature Granulocytes: 0.38 10*3/uL — ABNORMAL HIGH (ref 0.00–0.07)
Basophils Absolute: 0.1 10*3/uL (ref 0.0–0.1)
Basophils Relative: 1 %
Eosinophils Absolute: 0.4 10*3/uL (ref 0.0–0.5)
Eosinophils Relative: 3 %
HCT: 36.4 % (ref 36.0–46.0)
Hemoglobin: 11.6 g/dL — ABNORMAL LOW (ref 12.0–15.0)
Immature Granulocytes: 3 %
Lymphocytes Relative: 17 %
Lymphs Abs: 2.2 10*3/uL (ref 0.7–4.0)
MCH: 30.4 pg (ref 26.0–34.0)
MCHC: 31.9 g/dL (ref 30.0–36.0)
MCV: 95.5 fL (ref 80.0–100.0)
Monocytes Absolute: 1.5 10*3/uL — ABNORMAL HIGH (ref 0.1–1.0)
Monocytes Relative: 11 %
Neutro Abs: 8.5 10*3/uL — ABNORMAL HIGH (ref 1.7–7.7)
Neutrophils Relative %: 65 %
Platelets: 551 10*3/uL — ABNORMAL HIGH (ref 150–400)
RBC: 3.81 MIL/uL — ABNORMAL LOW (ref 3.87–5.11)
RDW: 14.9 % (ref 11.5–15.5)
WBC: 13.1 10*3/uL — ABNORMAL HIGH (ref 4.0–10.5)
nRBC: 0 % (ref 0.0–0.2)

## 2019-03-30 LAB — GLUCOSE, CAPILLARY
Glucose-Capillary: 129 mg/dL — ABNORMAL HIGH (ref 70–99)
Glucose-Capillary: 85 mg/dL (ref 70–99)
Glucose-Capillary: 95 mg/dL (ref 70–99)
Glucose-Capillary: 126 mg/dL — ABNORMAL HIGH (ref 70–99)
Glucose-Capillary: 132 mg/dL — ABNORMAL HIGH (ref 70–99)

## 2019-03-30 LAB — BASIC METABOLIC PANEL
Anion gap: 12 (ref 5–15)
BUN: 7 mg/dL (ref 6–20)
CO2: 25 mmol/L (ref 22–32)
Calcium: 8.7 mg/dL — ABNORMAL LOW (ref 8.9–10.3)
Chloride: 100 mmol/L (ref 98–111)
Creatinine, Ser: 0.74 mg/dL (ref 0.44–1.00)
GFR calc Af Amer: >60 mL/min (ref >60)
GFR calc non Af Amer: >60 mL/min (ref >60)
Glucose, Bld: 103 mg/dL — ABNORMAL HIGH (ref 70–99)
Potassium: 3.1 mmol/L — ABNORMAL LOW (ref 3.5–5.1)
Sodium: 137 mmol/L (ref 135–145)

## 2019-03-30 MED ORDER — FLUCONAZOLE 150 MG PO TABS
150.0000 mg | ORAL_TABLET | Freq: Once | ORAL | Status: AC
  Administered 2019-03-30: 150 mg via ORAL
  Filled 2019-03-30: qty 1

## 2019-03-30 MED ORDER — POTASSIUM CHLORIDE CRYS ER 20 MEQ PO TBCR
40.0000 meq | EXTENDED_RELEASE_TABLET | Freq: Two times a day (BID) | ORAL | Status: AC
  Administered 2019-03-30 (×2): 40 meq via ORAL
  Filled 2019-03-30 (×3): qty 2

## 2019-03-30 NOTE — TOC Progression Note (Addendum)
Transition of Care [facility]) - Progression Note    Patient Details  Name: Kathy Lawson MRN: [id number] Date of Birth: 1960-11-08  Transition of Care [facility]) CM/SW Contact  Bartholomew Crews, RN Phone Number: [phone] 03/30/2019, 8:21 AM  Clinical Narrative:    Unable to follow up with [employer] for home care services today d/t Veteran's Day holiday. Will f/u in AM.   Update: NCM did receive a call from [facility] this AM in response to expedited PCS form. Pending call back from nurse to do initial phone assessment with NCM.   Update: Received call from nurse at [facility]. Initial assessment completed. Patient should receive call from home care agency by end of week to set up [facility] services. [facility] to contact patient in 2 weeks to complete a more in depth assessment over the phone.    Expected Discharge Plan: [place] Barriers to Discharge: Continued Medical Work up  Expected Discharge Plan and Services Expected Discharge Plan: [place] In-house Referral: Clinical Social Work     Living arrangements for the past 2 months: Single Family Home                                       Social Determinants of Health (SDOH) Interventions    Readmission Risk Interventions Readmission Risk Prevention Plan 09/02/2018  Post Dischage Appt Complete  Medication Screening Complete  Transportation Screening Complete  Some recent data might be hidden

## 2019-03-30 NOTE — Progress Notes (Signed)
Occupational Therapy Treatment Patient Details Name: Kathy Lawson MRN: [id number] DOB: 1961/01/02 Today's Date: 03/30/2019    History of present illness 58 yo female w/ PMH significant for HTN, HLD, CAD, MI, COPD, asthma, crohns disease, chronic anemia and hypothyroidism who was a transfer from [facility] on 03/21/2019 after presenting there 03/17/2019 w/ pyelonephritis and E-coli bacteremia c/b worsening encephalopathy and concern for meningitis   OT comments  Pt making good progress towards OT goals this session. Session focus on functional mobility with RW and standing grooming at sink. Overall, pt supervision for UB/LB ADLs and for functional mobility with RW. Pt demos good safety awareness and seems to be nearing baseline level of function. Continue to recommend [place], will continue to follow acutely per POC.    Follow Up Recommendations  Home health OT    Equipment Recommendations  None recommended by OT    Recommendations for Other Services      Precautions / Restrictions Precautions Precautions: Fall Precaution Comments: reports falls at home Restrictions Weight Bearing Restrictions: No       Mobility Bed Mobility Overal bed mobility: Needs Assistance Bed Mobility: Supine to Sit     Supine to sit: Supervision;HOB elevated     General bed mobility comments: use of bed rails with HOB elevated  Transfers Overall transfer level: Needs assistance Equipment used: Rolling walker (2 wheeled) Transfers: Sit to/from Stand Sit to Stand: Supervision         General transfer comment: for safety, good technique    Balance Overall balance assessment: Needs assistance Sitting-balance support: Feet supported Sitting balance-Leahy Scale: Good Sitting balance - Comments: no LOB with donning socks   Standing balance support: During functional activity Standing balance-Leahy Scale: Fair Standing balance comment: able to complete standing grooming with no LOB                            ADL either performed or assessed with clinical judgement   ADL Overall ADL's : Needs assistance/impaired     Grooming: Oral care;Standing;Supervision/safety               Lower Body Dressing: Supervision/safety;Sitting/lateral leans Lower Body Dressing Details (indicate cue type and reason): able to access feet from EOB Toilet Transfer: Supervision/safety;Ambulation;RW Toilet Transfer Details (indicate cue type and reason): simulated to recliner         Functional mobility during ADLs: Supervision/safety;Rolling walker General ADL Comments: pt progressing well, supervision overall for standing ADLs with RW     Vision Baseline Vision/History: No visual deficits Vision Assessment?: No apparent visual deficits   Perception     Praxis      Cognition Arousal/Alertness: Awake/alert Behavior During Therapy: WFL for tasks assessed/performed Overall Cognitive Status: Within Functional Limits for tasks assessed                                          Exercises     Shoulder Instructions       General Comments  Pt reports she lives with her son and he handles most household tasks. Pt reports she will occasionally assist with dishes etc. but reports she will often sit down during IADLs/ ADLs as energy conservation strategy. Pt reports her granddaughter checks on her throughout the day and can assist with meal prep during the day. Pt is able to make sandwiches/ microwave meals during  the day while son/ daughter in law are at work.     Pertinent Vitals/ Pain       Pain Assessment: No/denies pain  Home Living                                          Prior Functioning/Environment              Frequency  Min 2X/week        Progress Toward Goals  OT Goals(current goals can now be found in the care plan section)  Progress towards OT goals: Progressing toward goals  Acute Rehab OT  Goals Patient Stated Goal: to go home OT Goal Formulation: With patient Time For Goal Achievement: 04/06/19 Potential to Achieve Goals: Good  Plan Discharge plan remains appropriate    Co-evaluation                 AM-PAC OT "6 Clicks" Daily Activity     Outcome Measure   Help from another person eating meals?: None Help from another person taking care of personal grooming?: None Help from another person toileting, which includes using toliet, bedpan, or urinal?: A Little Help from another person bathing (including washing, rinsing, drying)?: A Little Help from another person to put on and taking off regular upper body clothing?: None Help from another person to put on and taking off regular lower body clothing?: A Little 6 Click Score: 21    End of Session Equipment Utilized During Treatment: Rolling walker  OT Visit Diagnosis: Unsteadiness on feet (R26.81);Muscle weakness (generalized) (M62.81)   Activity Tolerance Patient tolerated treatment well   Patient Left in chair;with call bell/phone within reach   Nurse Communication          Time: 9833-8250 OT Time Calculation (min): 11 min  Charges: OT General Charges $OT Visit: 1 Visit OT Treatments $Self Care/Home Management : 8-22 mins  Lanier Clam., COTA/L Acute Rehabilitation Services [phone] [place] 03/30/2019, 10:40 AM

## 2019-03-30 NOTE — Progress Notes (Signed)
PROGRESS NOTE  Kathy Lawson ELF:[id number] DOB: 02-25-1961 DOA: 03/21/2019 PCP: Damita Dunnings, NP (Inactive)  HPI/Recap of past 77 hours: 58 year old woman admitted with altered mental status and E. coli pyelonephritis with bacteremia.  Admitted for further management    Today, patient reports feeling better, currently oriented, denies any chest pain, shortness of breath, abdominal pain, nausea/vomiting, fever/chills.  Refusing SNF.  Reported having yeast infection, ordered Diflucan 1 dose  Assessment/Plan: Principal Problem:   Pyelonephritis Active Problems:   Sepsis ([place])   Acute encephalopathy   Normocytic anemia   Hypomagnesemia   UTI (urinary tract infection)   E coli bacteremia  E. coli bacteremia 2/2 pyelonephritis Currently afebrile, with leukocytosis BC x2 NGTD Completed IV antibiotics with cefazolin and Keflex for total of 7 days ID on board, currently signed off Due to persistent leukocytosis, repeat UA showed large leukocytes, small blood, rare bacteria, UC pending Procalcitonin pending Chest x-ray unremarkable Monitor closely  Acute toxic metabolic encephalopathy Resolved  Hypokalemia Replace as needed  Hypertension Continue amlodipine, metoprolol  Hyperlipidemia Continue Lipitor  Asthma/COPD Continue nebulizer, Ellipta  GERD Continue Pepcid, pantoprazole  Anxiety/depression Continue Lexapro      Malnutrition Type:      Malnutrition Characteristics:      Nutrition Interventions:       Estimated body mass index is 31.63 kg/m as calculated from the following:   Height as of this encounter: 5' 3"  (1.6 m).   Weight as of this encounter: 81 kg.     Code Status: Full  Family Communication: None at bedside  Disposition Plan: Likely home   Consultants:  ID  Procedures:  None  Antimicrobials:  None  DVT prophylaxis: Heparin    Objective: Vitals:   03/29/19 2123 03/30/19 0431 03/30/19 0939 03/30/19  1617  BP: 118/73 120/61 133/74 131/68  Pulse: 90 78 87 69  Resp: 16 17 18 18   Temp: 98.1 F (36.7 C) 98.7 F (37.1 C) 98.8 F (37.1 C) 98.3 F (36.8 C)  TempSrc: Oral Oral Oral Oral  SpO2: 93% 94% 99% 94%  Weight:      Height:        Intake/Output Summary (Last 24 hours) at 03/30/2019 1825 Last data filed at 03/30/2019 1727 Gross per 24 hour  Intake 480 ml  Output 500 ml  Net -20 ml   Filed Weights   03/27/19 2117 03/28/19 0500 03/29/19 1352  Weight: 81 kg 81 kg 81 kg    Exam:  General: NAD   Cardiovascular: S1, S2 present  Respiratory: CTAB  Abdomen: Soft, nontender, nondistended, bowel sounds present  Musculoskeletal: No bilateral pedal edema noted  Skin: Normal  Psychiatry: Normal mood   Data Reviewed: CBC: Recent Labs  Lab 03/27/19 0339 03/30/19 1145  WBC 12.5* 13.1*  NEUTROABS 8.2* 8.5*  HGB 9.9* 11.6*  HCT 31.2* 36.4  MCV 96.0 95.5  PLT 487* 585*   Basic Metabolic Panel: Recent Labs  Lab 03/24/19 0535 03/25/19 0934 03/30/19 1145  NA 147* 143 137  K 2.3* 3.1* 3.1*  CL 106 105 100  CO2 30 27 25   GLUCOSE 124* 146* 103*  BUN 12 7 7   CREATININE 0.72 0.70 0.74  CALCIUM 9.0 8.7* 8.7*  MG 1.8 1.6*  --    GFR: Estimated Creatinine Clearance: 76.3 mL/min (by C-G formula based on SCr of 0.74 mg/dL). Liver Function Tests: Recent Labs  Lab 03/24/19 0535  AST 16  ALT 10  ALKPHOS 111  BILITOT 0.6  PROT 6.1*  ALBUMIN 2.8*  No results for input(s): LIPASE, AMYLASE in the last 168 hours. No results for input(s): AMMONIA in the last 168 hours. Coagulation Profile: No results for input(s): INR, PROTIME in the last 168 hours. Cardiac Enzymes: No results for input(s): CKTOTAL, CKMB, CKMBINDEX, TROPONINI in the last 168 hours. BNP (last 3 results) No results for input(s): PROBNP in the last 8760 hours. HbA1C: No results for input(s): HGBA1C in the last 72 hours. CBG: Recent Labs  Lab 03/29/19 1610 03/29/19 2125 03/30/19 0722  03/30/19 1126 03/30/19 1616  GLUCAP 130* 137* 129* 85 95   Lipid Profile: No results for input(s): CHOL, HDL, LDLCALC, TRIG, CHOLHDL, LDLDIRECT in the last 72 hours. Thyroid Function Tests: No results for input(s): TSH, T4TOTAL, FREET4, T3FREE, THYROIDAB in the last 72 hours. Anemia Panel: No results for input(s): VITAMINB12, FOLATE, FERRITIN, TIBC, IRON, RETICCTPCT in the last 72 hours. Urine analysis:    Component Value Date/Time   COLORURINE STRAW (A) 03/30/2019 1737   APPEARANCEUR CLEAR 03/30/2019 1737   LABSPEC 1.006 03/30/2019 1737   PHURINE 7.0 03/30/2019 1737   GLUCOSEU NEGATIVE 03/30/2019 1737   HGBUR SMALL (A) 03/30/2019 1737   BILIRUBINUR NEGATIVE 03/30/2019 1737   KETONESUR NEGATIVE 03/30/2019 1737   PROTEINUR NEGATIVE 03/30/2019 1737   NITRITE NEGATIVE 03/30/2019 1737   LEUKOCYTESUR LARGE (A) 03/30/2019 1737   Sepsis Labs: @LABRCNTIP (procalcitonin:4,lacticidven:4)  ) Recent Results (from the past 240 hour(s))  Culture, blood (routine x 2)     Status: None   Collection Time: 03/21/19  2:26 AM   Specimen: BLOOD  Result Value Ref Range Status   Specimen Description BLOOD RIGHT ANTECUBITAL  Final   Special Requests   Final    BOTTLES DRAWN AEROBIC AND ANAEROBIC Blood Culture adequate volume   Culture   Final    NO GROWTH 5 DAYS Performed at [facility], [place] [address] [place], [place] [address]    Report Status 03/26/2019 FINAL  Final  Culture, blood (routine x 2)     Status: None   Collection Time: 03/21/19  2:27 AM   Specimen: BLOOD LEFT HAND  Result Value Ref Range Status   Specimen Description BLOOD LEFT HAND  Final   Special Requests   Final    BOTTLES DRAWN AEROBIC ONLY Blood Culture adequate volume   Culture   Final    NO GROWTH 5 DAYS Performed at [facility], [place] [address], [place], [place] [address]    Report Status 03/26/2019 FINAL  Final  MRSA PCR Screening     Status: Abnormal   Collection Time: 03/22/19  5:02 AM    Specimen: Nasal Mucosa; Nasopharyngeal  Result Value Ref Range Status   MRSA by PCR POSITIVE (A) NEGATIVE Final    Comment:        The GeneXpert MRSA Assay (FDA approved for NASAL specimens only), is one component of a comprehensive MRSA colonization surveillance program. It is not intended to diagnose MRSA infection nor to guide or monitor treatment for MRSA infections. RESULT CALLED TO, READ BACK BY AND VERIFIED WITH: PHILLIPS,K RN 0630 03/22/2019 MITCHELL,L Performed at [facility], [place] [address], [place], [place] [address]       Studies: Dg Chest Port 1 View  Result Date: 03/30/2019 CLINICAL DATA:  Leukocytosis EXAM: PORTABLE CHEST 1 VIEW COMPARISON:  March 21, 2019 FINDINGS: No pneumothorax. Mild atelectasis in the bases. No focal infiltrates. The cardiomediastinal silhouette is stable. No other acute abnormalities. IMPRESSION: Mild atelectasis in the bases. No  other acute abnormalities are identified. Electronically Signed   By: Dorise Bullion III M.D   On: 03/30/2019 13:58    Scheduled Meds: . amLODipine  5 mg Oral Daily  . aspirin EC  81 mg Oral Daily  . atorvastatin  40 mg Oral q1800  . budesonide (PULMICORT) nebulizer solution  0.25 mg Nebulization BID  . Chlorhexidine Gluconate Cloth  6 each Topical Daily  . diphenoxylate-atropine  2 tablet Oral TID AC  . escitalopram  10 mg Oral Daily  . famotidine  20 mg Oral Daily  . folic acid  1 mg Oral Daily  . furosemide  40 mg Oral Daily  . heparin  5,000 Units Subcutaneous Q8H  . insulin aspart  0-9 Units Subcutaneous TID WC  . metoprolol tartrate  12.5 mg Oral BID  . pantoprazole  40 mg Oral Daily  . potassium chloride  40 mEq Oral BID  . thiamine  100 mg Oral Daily  . umeclidinium bromide  1 puff Inhalation Daily    Continuous Infusions: . sodium chloride Stopped (03/22/19 1757)     LOS: 9 days     Alma Friendly, MD [facility]  If 7PM-7AM, please contact night-coverage  [email or web address] 03/30/2019, 6:25 PM

## 2019-03-31 LAB — BASIC METABOLIC PANEL
Anion gap: 12 (ref 5–15)
BUN: 8 mg/dL (ref 6–20)
CO2: 24 mmol/L (ref 22–32)
Calcium: 8.9 mg/dL (ref 8.9–10.3)
Chloride: 104 mmol/L (ref 98–111)
Creatinine, Ser: 0.86 mg/dL (ref 0.44–1.00)
GFR calc Af Amer: >60 mL/min (ref >60)
GFR calc non Af Amer: >60 mL/min (ref >60)
Glucose, Bld: 115 mg/dL — ABNORMAL HIGH (ref 70–99)
Potassium: 3.9 mmol/L (ref 3.5–5.1)
Sodium: 140 mmol/L (ref 135–145)

## 2019-03-31 LAB — CBC WITH DIFFERENTIAL/PLATELET
Abs Immature Granulocytes: 0.34 10*3/uL — ABNORMAL HIGH (ref 0.00–0.07)
Basophils Absolute: 0.1 10*3/uL (ref 0.0–0.1)
Basophils Relative: 1 %
Eosinophils Absolute: 0.4 10*3/uL (ref 0.0–0.5)
Eosinophils Relative: 4 %
HCT: 33 % — ABNORMAL LOW (ref 36.0–46.0)
Hemoglobin: 10.4 g/dL — ABNORMAL LOW (ref 12.0–15.0)
Immature Granulocytes: 3 %
Lymphocytes Relative: 23 %
Lymphs Abs: 2.5 10*3/uL (ref 0.7–4.0)
MCH: 30.2 pg (ref 26.0–34.0)
MCHC: 31.5 g/dL (ref 30.0–36.0)
MCV: 95.9 fL (ref 80.0–100.0)
Monocytes Absolute: 1.4 10*3/uL — ABNORMAL HIGH (ref 0.1–1.0)
Monocytes Relative: 13 %
Neutro Abs: 6 10*3/uL (ref 1.7–7.7)
Neutrophils Relative %: 56 %
Platelets: 544 10*3/uL — ABNORMAL HIGH (ref 150–400)
RBC: 3.44 MIL/uL — ABNORMAL LOW (ref 3.87–5.11)
RDW: 15 % (ref 11.5–15.5)
WBC: 10.7 10*3/uL — ABNORMAL HIGH (ref 4.0–10.5)
nRBC: 0 % (ref 0.0–0.2)

## 2019-03-31 LAB — PROCALCITONIN: Procalcitonin: <0.1 ng/mL

## 2019-03-31 LAB — GLUCOSE, CAPILLARY
Glucose-Capillary: 113 mg/dL — ABNORMAL HIGH (ref 70–99)
Glucose-Capillary: 112 mg/dL — ABNORMAL HIGH (ref 70–99)

## 2019-03-31 MED ORDER — CLOTRIMAZOLE 1 % EX OINT: TOPICAL_OINTMENT | CUTANEOUS | 0 refills | Status: DC

## 2019-03-31 NOTE — Progress Notes (Signed)
DISCHARGE NOTE SNF [facility] to be discharged Home per MD order. Patient verbalized understanding.  Skin clean, dry and intact without evidence of skin break down, no evidence of skin tears noted. IV catheter discontinued intact. Site without signs and symptoms of complications. Dressing and pressure applied. Pt denies pain at the site currently. No complaints noted.  Patient free of lines, drains, and wounds.   Discharge packet assembled. An After Visit Summary (AVS) was printed and given to the patient at bedside. Patient escorted via wheelchair and discharged to family member via private transportation. All questions and concerns addressed.   Dolores Hoose, RN

## 2019-03-31 NOTE — Progress Notes (Signed)
Physical Therapy Treatment Patient Details Name: Kathy Lawson MRN: [id number] DOB: 12/05/1960 Today's Date: 03/31/2019    History of Present Illness 58 yo female w/ PMH significant for HTN, HLD, CAD, MI, COPD, asthma, crohns disease, chronic anemia and hypothyroidism who was a transfer from [facility] hospital on 03/21/2019 after presenting there 03/17/2019 w/ pyelonephritis and E-coli bacteremia c/b worsening encephalopathy and concern for meningitis    PT Comments    Pt was seen to mobilize after having been in bed most of AM with some short walks in the room.  Her plan is to receive Home PT and to work on more strenuous tasks for functional improvement.  Pt is able to climb stairs, and has good tolerance for this activity to get home.  Will work toward better balance, better strength and improved control of walker for gait as well as for safety with all PT.  See pt to follow up on all goals of PT before DC home this week.   Follow Up Recommendations  Home health PT;Supervision for mobility/OOB     Equipment Recommendations  None recommended by PT    Recommendations for Other Services       Precautions / Restrictions Precautions Precautions: Fall Precaution Comments: has fallen at home Restrictions Weight Bearing Restrictions: No    Mobility  Bed Mobility Overal bed mobility: Needs Assistance Bed Mobility: Supine to Sit;Sit to Supine     Supine to sit: Min guard Sit to supine: Min guard   General bed mobility comments: min guard for safety with rails and elevated HOB  Transfers Overall transfer level: Needs assistance Equipment used: Rolling walker (2 wheeled) Transfers: Sit to/from Stand Sit to Stand: Min guard         General transfer comment: min guard to steady if needed upon initial standing  Ambulation/Gait Ambulation/Gait assistance: Min guard Gait Distance (Feet): 110 Feet Assistive device: Rolling walker (2 wheeled);1 person hand held assist Gait  Pattern/deviations: Step-through pattern;Step-to pattern;Decreased stride length;Wide base of support Gait velocity: reduced Gait velocity interpretation: <1.31 ft/sec, indicative of household ambulator General Gait Details: walked toward her door, then to the staircase next door to her room   Stairs Stairs: Yes Stairs assistance: Min guard Stair Management: One rail Right;One rail Left;Step to pattern;Alternating pattern;Forwards Number of Stairs: 4 General stair comments: 4 steps to climb with railing to support, note her motivation to succeed for home   Wheelchair Mobility    Modified Rankin (Stroke Patients Only)       Balance Overall balance assessment: Needs assistance Sitting-balance support: Feet supported Sitting balance-Leahy Scale: Good Sitting balance - Comments: could lean over to manage footwear   Standing balance support: During functional activity Standing balance-Leahy Scale: Fair Standing balance comment: stood to walk toward steps                            Cognition Arousal/Alertness: Awake/alert Behavior During Therapy: WFL for tasks assessed/performed Overall Cognitive Status: Within Functional Limits for tasks assessed Area of Impairment: Problem solving;Safety/judgement;Awareness                   Current Attention Level: Sustained Memory: Decreased short-term memory;Decreased recall of precautions Following Commands: Follows one step commands with increased time Safety/Judgement: Decreased awareness of deficits Awareness: Intellectual Problem Solving: Slow processing;Requires verbal cues General Comments: pt is up to stand from side of bed, with sequenced gait and taking her time      Exercises  General Comments General comments (skin integrity, edema, etc.): pt was able to demonstrate her control of standing balance, was able to follow cues for gait on the hall as instructed      Pertinent Vitals/Pain Pain  Assessment: No/denies pain    Home Living                      Prior Function            PT Goals (current goals can now be found in the care plan section) Acute Rehab PT Goals Patient Stated Goal: to go home Progress towards PT goals: Progressing toward goals    Frequency    Min 3X/week      PT Plan Discharge plan needs to be updated    Co-evaluation              AM-PAC PT "6 Clicks" Mobility   Outcome Measure  Help needed turning from your back to your side while in a flat bed without using bedrails?: A Little Help needed moving from lying on your back to sitting on the side of a flat bed without using bedrails?: A Little Help needed moving to and from a bed to a chair (including a wheelchair)?: A Little Help needed standing up from a chair using your arms (e.g., wheelchair or bedside chair)?: A Little Help needed to walk in hospital room?: A Little Help needed climbing 3-5 steps with a railing? : A Little 6 Click Score: 18    End of Session Equipment Utilized During Treatment: Gait belt Activity Tolerance: Patient tolerated treatment well;Patient limited by fatigue Patient left: in bed;with call bell/phone within reach;with bed alarm set Nurse Communication: Mobility status PT Visit Diagnosis: Unsteadiness on feet (R26.81);Other abnormalities of gait and mobility (R26.89);Muscle weakness (generalized) (M62.81);History of falling (Z91.81);Difficulty in walking, not elsewhere classified (R26.2);Pain     Time: 5797-2820 PT Time Calculation (min) (ACUTE ONLY): 11 min  Charges:  $Gait Training: 8-22 mins                  Ramond Dial 03/31/2019, 5:06 PM   Mee Hives, PT MS Acute Rehab Dept. Number: [place] and [place]

## 2019-03-31 NOTE — Plan of Care (Signed)
  Problem: Fluid Volume: Goal: Hemodynamic stability will improve 03/31/2019 1258 by Dolores Hoose, RN Outcome: Adequate for Discharge 03/31/2019 1013 by Dolores Hoose, RN Outcome: Progressing   Problem: Clinical Measurements: Goal: Diagnostic test results will improve Outcome: Adequate for Discharge Goal: Signs and symptoms of infection will decrease 03/31/2019 1258 by Dolores Hoose, RN Outcome: Adequate for Discharge 03/31/2019 1013 by Dolores Hoose, RN Outcome: Progressing   Problem: Respiratory: Goal: Ability to maintain adequate ventilation will improve Outcome: Adequate for Discharge

## 2019-03-31 NOTE — Plan of Care (Signed)
  Problem: Fluid Volume: Goal: Hemodynamic stability will improve Outcome: Progressing   Problem: Clinical Measurements: Goal: Signs and symptoms of infection will decrease Outcome: Progressing

## 2019-03-31 NOTE — Discharge Summary (Signed)
Discharge Summary  Kathy Lawson FVC:[id number] DOB: Jun 03, 1960  PCP: Damita Dunnings, NP (Inactive)  Admit date: 03/21/2019 Discharge date: 03/31/2019  Time spent: 35 mins  Recommendations for Outpatient Follow-up:  1. PCP in 1 week  Discharge Diagnoses:  Active Hospital Problems   Diagnosis Date Noted  . Pyelonephritis 03/23/2019  . E coli bacteremia 03/23/2019  . Sepsis ([place]) 03/21/2019  . Acute encephalopathy 03/21/2019  . Normocytic anemia 03/21/2019  . Hypomagnesemia 03/21/2019  . UTI (urinary tract infection) 03/21/2019    Resolved Hospital Problems  No resolved problems to display.    Discharge Condition: Stable  Diet recommendation: Heart healthy diet  Vitals:   03/31/19 0429 03/31/19 0921  BP: (!) 111/58 (!) 100/56  Pulse: 70 74  Resp: 15 18  Temp: 98.6 F (37 C) 98.7 F (37.1 C)  SpO2: 91% 96%    History of present illness:  58 year old woman admitted with altered mental status and E. coli pyelonephritis with bacteremia.  Admitted for further management.    Today, patient denies any new complaints.  Denies any fever/chills, chest pain, shortness of breath, abdominal pain, nausea/vomiting, dysuria.  Patient stable to be discharged home with outpatient physical therapy.  Hospital Course:  Principal Problem:   Pyelonephritis Active Problems:   Sepsis ([place])   Acute encephalopathy   Normocytic anemia   Hypomagnesemia   UTI (urinary tract infection)   E coli bacteremia  E. coli bacteremia 2/2 pyelonephritis Currently afebrile, with resolving leukocytosis BC x2 NGTD Completed IV antibiotics with cefazolin and Keflex for total of 7 days ID on board, signed off Repeat UA showed large leukocytes, small blood, rare bacteria, patient completely asymptomatic Procalcitonin negative Chest x-ray unremarkable Follow-up with PCP in 1 week, with repeat labs  Acute toxic metabolic encephalopathy Resolved  Hypokalemia Replaced as needed   Hypertension Continue metoprolol  Hyperlipidemia Continue Lipitor  Asthma/COPD Continue home inhalers, nebulizers as needed  GERD Continue PPI  Anxiety/depression Continue Lexapro        Malnutrition Type:      Malnutrition Characteristics:      Nutrition Interventions:      Estimated body mass index is 28.66 kg/m as calculated from the following:   Height as of this encounter: 5' 3"  (1.6 m).   Weight as of this encounter: 73.4 kg.    Procedures:  None  Consultations:  ID  Discharge Exam: BP (!) 100/56 (BP Location: Right Arm)   Pulse 74   Temp 98.7 F (37.1 C) (Oral)   Resp 18   Ht 5' 3"  (1.6 m)   Wt 73.4 kg   SpO2 96%   BMI 28.66 kg/m   General: NAD Cardiovascular: S1, S2 present Respiratory: CTA B  Discharge Instructions You were cared for by a hospitalist during your hospital stay. If you have any questions about your discharge medications or the care you received while you were in the hospital after you are discharged, you can call the unit and asked to speak with the hospitalist on call if the hospitalist that took care of you is not available. Once you are discharged, your primary care physician will handle any further medical issues. Please note that NO REFILLS for any discharge medications will be authorized once you are discharged, as it is imperative that you return to your primary care physician (or establish a relationship with a primary care physician if you do not have one) for your aftercare needs so that they can reassess your need for medications and monitor your lab  values.  Discharge Instructions    Ambulatory referral to Physical Therapy   Complete by: As directed    Iontophoresis - 4 mg/ml of dexamethasone: No   T.E.N.S. Unit Evaluation and Dispense as Indicated: No   Diet - low sodium heart healthy   Complete by: As directed    Increase activity slowly   Complete by: As directed      Allergies as of 03/31/2019       Reactions   Lasix [furosemide] Other (See Comments)   Causes hyponatremia    Other Other (See Comments)   Sulfonamide (substance) - unknown reaction   Latex Rash      Medication List    TAKE these medications   acetaminophen 500 MG tablet Commonly known as: TYLENOL Take 1,000 mg by mouth every 6 (six) hours as needed for mild pain or headache.   albuterol 108 (90 Base) MCG/ACT inhaler Commonly known as: VENTOLIN HFA Inhale 2 puffs into the lungs every 4 (four) hours as needed for wheezing or shortness of breath.   aspirin EC 81 MG tablet Take 81 mg by mouth daily.   atorvastatin 40 MG tablet Commonly known as: LIPITOR Take 40 mg by mouth at bedtime.   budesonide-formoterol 160-4.5 MCG/ACT inhaler Commonly known as: Symbicort Inhale 2 puffs into the lungs 2 (two) times daily. What changed:   when to take this  reasons to take this   Clotrimazole 1 % Oint Apply externally around vagina for yeast infection   escitalopram 10 MG tablet Commonly known as: LEXAPRO Take 10 mg by mouth daily.   folic acid 1 MG tablet Commonly known as: FOLVITE Take 1 mg by mouth daily.   furosemide 40 MG tablet Commonly known as: Lasix Take 1 tablet (40 mg total) by mouth daily.   HYDROcodone-acetaminophen 5-325 MG tablet Commonly known as: NORCO/VICODIN Take 1 tablet by mouth every 8 (eight) hours as needed for moderate pain. What changed: when to take this   ipratropium-albuterol 0.5-2.5 (3) MG/3ML Soln Commonly known as: DUONEB Take 3 mLs by nebulization every 6 (six) hours as needed (shortness of breath/wheezing).   Lomotil 2.5-0.025 MG tablet Generic drug: diphenoxylate-atropine Take 2 tablets by mouth 3 (three) times daily before meals. For Crohn's disease   meloxicam 15 MG tablet Commonly known as: MOBIC Take 15 mg by mouth daily.   metoprolol succinate 25 MG 24 hr tablet Commonly known as: TOPROL-XL Take 25 mg by mouth daily as needed (SBP >110).   mometasone  50 MCG/ACT nasal spray Commonly known as: NASONEX Place 2 sprays into the nose 2 (two) times daily as needed (congestion/allergies).   montelukast 10 MG tablet Commonly known as: SINGULAIR Take 10 mg by mouth at bedtime.   omeprazole 40 MG capsule Commonly known as: PRILOSEC Take 40 mg by mouth daily before breakfast.   POTASSIUM PO Take 1 tablet by mouth daily.   PROBIOTIC PO Take 1 tablet by mouth daily.   tiotropium 18 MCG inhalation capsule Commonly known as: SPIRIVA Place 18 mcg into inhaler and inhale daily.   tiZANidine 4 MG tablet Commonly known as: ZANAFLEX Take 4 mg by mouth 3 (three) times daily as needed for muscle spasms.      Allergies  Allergen Reactions  . Lasix [Furosemide] Other (See Comments)    Causes hyponatremia   . Other Other (See Comments)    Sulfonamide (substance) - unknown reaction  . Latex Rash   Follow-up Information    [facility] Follow  up.   Why: this is for outpatient physical therapy Contact information:  [address],  [place], [place] [address] [phone]       Damita Dunnings, NP. Schedule an appointment as soon as possible for a visit in 1 week(s).   Contact information: [address] [place] [address] [phone]        Richardo Priest, MD .   Specialty: Cardiology Contact information: [address] [place] [address] [phone]            The results of significant diagnostics from this hospitalization (including imaging, microbiology, ancillary and laboratory) are listed below for reference.    Significant Diagnostic Studies: Dg Knee 1-2 Views Left  Result Date: 03/29/2019 CLINICAL DATA:  58 year old female with left anterior knee pain. EXAM: LEFT KNEE - 1-2 VIEW COMPARISON:  None. FINDINGS: There is no acute fracture or dislocation. The bones are osteopenic. Mild arthritic changes with mild narrowing of the medial compartment. No joint effusion. The soft tissues  are unremarkable. Vascular calcifications noted. IMPRESSION: 1. No acute fracture or dislocation. 2. Osteopenia with mild arthritic changes. Electronically Signed   By: Anner Crete M.D.   On: 03/29/2019 14:55   Dg Chest Port 1 View  Result Date: 03/30/2019 CLINICAL DATA:  Leukocytosis EXAM: PORTABLE CHEST 1 VIEW COMPARISON:  March 21, 2019 FINDINGS: No pneumothorax. Mild atelectasis in the bases. No focal infiltrates. The cardiomediastinal silhouette is stable. No other acute abnormalities. IMPRESSION: Mild atelectasis in the bases. No other acute abnormalities are identified. Electronically Signed   By: Dorise Bullion III M.D   On: 03/30/2019 13:58   Dg Chest Port 1 View  Result Date: 03/21/2019 CLINICAL DATA:  Tachypnea EXAM: PORTABLE CHEST 1 VIEW COMPARISON:  03/18/2019 FINDINGS: Heart is normal size. Bibasilar opacities, likely atelectasis. No visible effusions or edema. No acute bony abnormality. IMPRESSION: Bibasilar opacities, likely atelectasis. Electronically Signed   By: Rolm Baptise M.D.   On: 03/21/2019 01:48    Microbiology: Recent Results (from the past 240 hour(s))  MRSA PCR Screening     Status: Abnormal   Collection Time: 03/22/19  5:02 AM   Specimen: Nasal Mucosa; Nasopharyngeal  Result Value Ref Range Status   MRSA by PCR POSITIVE (A) NEGATIVE Final    Comment:        The GeneXpert MRSA Assay (FDA approved for NASAL specimens only), is one component of a comprehensive MRSA colonization surveillance program. It is not intended to diagnose MRSA infection nor to guide or monitor treatment for MRSA infections. RESULT CALLED TO, READ BACK BY AND VERIFIED WITH: PHILLIPS,K RN 0630 03/22/2019 MITCHELL,L Performed at [facility], [place] [address] [place], [place] [address]      Labs: Basic Metabolic Panel: Recent Labs  Lab 03/25/19 0934 03/30/19 1145 03/31/19 0533  NA 143 137 140  K 3.1* 3.1* 3.9  CL 105 100 104  CO2 27 25 24   GLUCOSE 146* 103*  115*  BUN 7 7 8   CREATININE 0.70 0.74 0.86  CALCIUM 8.7* 8.7* 8.9  MG 1.6*  --   --    Liver Function Tests: No results for input(s): AST, ALT, ALKPHOS, BILITOT, PROT, ALBUMIN in the last 168 hours. No results for input(s): LIPASE, AMYLASE in the last 168 hours. No results for input(s): AMMONIA in the last 168 hours. CBC: Recent Labs  Lab 03/27/19 0339 03/30/19 1145 03/31/19 0533  WBC 12.5* 13.1* 10.7*  NEUTROABS 8.2* 8.5* 6.0  HGB  9.9* 11.6* 10.4*  HCT 31.2* 36.4 33.0*  MCV 96.0 95.5 95.9  PLT 487* 551* 544*   Cardiac Enzymes: No results for input(s): CKTOTAL, CKMB, CKMBINDEX, TROPONINI in the last 168 hours. BNP: BNP (last 3 results) No results for input(s): BNP in the last 8760 hours.  ProBNP (last 3 results) No results for input(s): PROBNP in the last 8760 hours.  CBG: Recent Labs  Lab 03/30/19 1126 03/30/19 1616 03/30/19 2055 03/30/19 2117 03/31/19 0638  GLUCAP 85 95 126* 132* 113*       Signed:  Alma Friendly, MD [facility] 03/31/2019, 11:04 AM

## 2019-04-17 NOTE — Progress Notes (Signed)
Cardiology Office Note:    Date:  04/18/2019   ID:  Kathy Lawson, DOB 08/08/1960, MRN [id number]  PCP:  Alanson Puls, [facility]  Cardiologist:  Shirlee More, MD    Referring MD: No ref. provider found    ASSESSMENT:    1. History of MI (myocardial infarction)   2. Chronic combined systolic and diastolic CHF (congestive heart failure) ([place])   3. Hypertensive heart disease with heart failure ([place])   4. Mixed hyperlipidemia   5. Centrilobular emphysema ([place])    PLAN:    In order of problems listed above:  1. In my judgment she had a type II myocardial infarction associated with acute medical illness.  She is finally improved to the point where she is a good candidate for further evaluation and after discussion of options undergo myocardial perfusion study delayed until January at her request.  She will continue medical therapy including aspirin beta-blocker and statin.  If she had high risk markers we need to consider revascularization. 2. Heart failure is compensated she has no edema to my exam [place] Heart Association class I continue her current loop diuretic and reassess ejection fraction of less than 40% I will place her on Entresto. 3. Stable at times her home blood pressures less than 440 systolic continue current treatment beta-blocker diuretic and hold on ACE or ARB at this time 4. Continue high intensity statin recent labs requested from his PCP 5. Stable improved managed by her PCP   Next appointment: 3 months   Medication Adjustments/Labs and Tests Ordered: Current medicines are reviewed at length with the patient today.  Concerns regarding medicines are outlined above.  No orders of the defined types were placed in this encounter.  No orders of the defined types were placed in this encounter.   Chief Complaint  Patient presents with   Follow-up    Type 2 MI   Congestive Heart Failure    History of Present Illness:    Kathy Lawson is a 58  y.o. female with a hx of a history of type II non-ST segment elevation in the setting of severe COPD profound respiratory distress and decompensated heart failure in the setting of IV fluid loading.  Her ejection fraction this year was 35 to 40%.  She was seen by me in cardiology evaluation 07/29/2018 and at that time was not taking a diuretic had marked edema and was in decompensated heart failure.  During her hospitalization there was a recommendation she undergo coronary angiography and I told her that under general health condition and with comorbid morbidities I felt she was a poor candidate for elective coronary angiography.  She was placed back on her diuretic and I asked the family to consider whether we should not do a myocardial perfusion study or cardiac CTA for further evaluation once her heart failure was compensated.  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   She weas last seen 10/15/2018.  She is discharged from [facility] 03/31/2019 after admission for pyelonephritis treated with IV antibiotics.  There were no cardiac complications of the admission and no testing performed after transfer from [facility]. Compliance with diet, lifestyle and medications: Yes  She has had a very difficult time with recurrent episodes of C. difficile colitis and recent urinary tract infection.  Her bowels are back to normal she has gained strength except for back pain  and difficulty ambulating she feels back to normal.  She tells me her thyroid is excessive and is recently placed on suppressant therapy?  PTU by primary care physician.  She has a little bit of swelling in the left leg but no shortness of breath chest pain palpitation or syncope and tolerates cardiac meds including aspirin statin beta-blocker and her loop diuretic.  She tells me she had lab work done 2  weeks ago and I requested a copy from her PCP.  I initiated discussion about an ischemia evaluation she agrees to undergo myocardial perfusion study but she would like to wait until after the holidays so her strength is better and I agree we will also do an echocardiogram to assess LV function with reduced ejection fraction.  As she has extensive ischemia and high risk markers she would benefit from revascularization as she appears to have finally recovered from her recurrent and complex medical illnesses. Past Medical History:  Diagnosis Date   C. difficile colitis 08/2018   Coronary artery disease    Emphysema/COPD ([place])    Hyperlipemia    Hypertension    Vertigo     Past Surgical History:  Procedure Laterality Date   ABDOMINAL HYSTERECTOMY     CHOLECYSTECTOMY  1980s   HERNIA REPAIR     hysterectomy  1999   TUBAL LIGATION  1997    Current Medications: Current Meds  Medication Sig   albuterol (PROVENTIL HFA;VENTOLIN HFA) 108 (90 BASE) MCG/ACT inhaler Inhale 2 puffs into the lungs every 4 (four) hours as needed for wheezing or shortness of breath.    aspirin EC 81 MG tablet Take 81 mg by mouth daily.   atorvastatin (LIPITOR) 40 MG tablet Take 40 mg by mouth at bedtime.    budesonide-formoterol (SYMBICORT) 160-4.5 MCG/ACT inhaler Inhale 2 puffs into the lungs 2 (two) times daily.   diphenoxylate-atropine (LOMOTIL) 2.5-0.025 MG tablet Take 2 tablets by mouth 3 (three) times daily before meals. For Crohn's disease   escitalopram (LEXAPRO) 10 MG tablet Take 10 mg by mouth daily.   folic acid (FOLVITE) 1 MG tablet Take 1 mg by mouth daily.   furosemide (LASIX) 40 MG tablet Take 1 tablet (40 mg total) by mouth daily.   HYDROcodone-acetaminophen (NORCO/VICODIN) 5-325 MG tablet Take 1 tablet by mouth every 8 (eight) hours as needed for moderate pain.   ipratropium-albuterol (DUONEB) 0.5-2.5 (3) MG/3ML SOLN Take 3 mLs by nebulization every 6 (six) hours as needed  (shortness of breath/wheezing).    meloxicam (MOBIC) 15 MG tablet Take 15 mg by mouth daily.   metoprolol succinate (TOPROL-XL) 25 MG 24 hr tablet Take 25 mg by mouth daily as needed (SBP >110).    mometasone (NASONEX) 50 MCG/ACT nasal spray Place 2 sprays into the nose 2 (two) times daily as needed (congestion/allergies).    montelukast (SINGULAIR) 10 MG tablet Take 10 mg by mouth at bedtime.   omeprazole (PRILOSEC) 40 MG capsule Take 40 mg by mouth daily before breakfast.   tiZANidine (ZANAFLEX) 4 MG tablet Take 4 mg by mouth 3 (three) times daily as needed for muscle spasms.     Allergies:   Lasix [furosemide], Other, and Latex   Social History   Socioeconomic History   Marital status: Widowed    Spouse name: Not on file   Number of children: Not on file   Years of education: Not on file   Highest education level: Not on file  Occupational History   Not on file  Social  Needs   Financial resource strain: Not on file   Food insecurity    Worry: Not on file    Inability: Not on file   Transportation needs    Medical: Not on file    Non-medical: Not on file  Tobacco Use   Smoking status: Former Smoker    Packs/day: 1.50    Types: Cigarettes    Start date: 05/19/1974    Quit date: 07/01/2018    Years since quitting: 0.7   Smokeless tobacco: Never Used  Substance and Sexual Activity   Alcohol use: Not Currently    Comment: 5-6 beers couple times weekly   Drug use: No   Sexual activity: Not on file  Lifestyle   Physical activity    Days per week: Not on file    Minutes per session: Not on file   Stress: Not on file  Relationships   Social connections    Talks on phone: Not on file    Gets together: Not on file    Attends religious service: Not on file    Active member of club or organization: Not on file    Attends meetings of clubs or organizations: Not on file    Relationship status: Not on file  Other Topics Concern   Not on file  Social  History Narrative   Not on file     Family History: The patient's family history includes Allergies in her sister; Emphysema in her father; Rheum arthritis in her mother; Skin cancer in her mother. ROS:   Please see the history of present illness.    All other systems reviewed and are negative.  EKGs/Labs/Other Studies Reviewed:    The following studies were reviewed today:  Recent Labs: 03/21/2019: TSH 1.527 03/24/2019: ALT 10 03/25/2019: Magnesium 1.6 03/31/2019: BUN 8; Creatinine, Ser 0.86; Hemoglobin 10.4; Platelets 544; Potassium 3.9; Sodium 140  Recent Lipid Panel No results found for: CHOL, TRIG, HDL, CHOLHDL, VLDL, LDLCALC, LDLDIRECT  Physical Exam:    VS:  BP 132/80 (BP Location: Left Arm, Patient Position: Sitting, Cuff Size: Normal)    Pulse 78    Ht 5' 2"  (1.575 m)    Wt 161 lb 12.8 oz (73.4 kg)    SpO2 96%    BMI 29.59 kg/m     Wt Readings from Last 3 Encounters:  04/18/19 161 lb 12.8 oz (73.4 kg)  03/30/19 161 lb 13.1 oz (73.4 kg)  10/15/18 145 lb 9.6 oz (66 kg)     GEN: She appears much stronger but still looks chronically ill well nourished, well developed in no acute distress HEENT: Normal NECK: No JVD; No carotid bruits LYMPHATICS: No lymphadenopathy CARDIAC: RRR, no murmurs, rubs, gallops RESPIRATORY:  Clear to auscultation without rales, wheezing or rhonchi  ABDOMEN: Soft, non-tender, non-distended MUSCULOSKELETAL:  No edema; No deformity  SKIN: Warm and dry NEUROLOGIC:  Alert and oriented x 3 PSYCHIATRIC:  Normal affect    Signed, Shirlee More, MD  04/18/2019 11:25 AM    [facility]

## 2019-04-18 ENCOUNTER — Encounter: Payer: Self-pay | Admitting: Cardiology

## 2019-04-18 ENCOUNTER — Other Ambulatory Visit: Payer: Self-pay

## 2019-04-18 ENCOUNTER — Encounter: Payer: Self-pay | Admitting: *Deleted

## 2019-04-18 ENCOUNTER — Ambulatory Visit (INDEPENDENT_AMBULATORY_CARE_PROVIDER_SITE_OTHER): Payer: Medicaid Other | Admitting: Cardiology

## 2019-04-18 VITALS — BP 132/80 | HR 78 | Ht 62.0 in | Wt 161.8 lb

## 2019-04-18 DIAGNOSIS — E782 Mixed hyperlipidemia: Secondary | ICD-10-CM

## 2019-04-18 DIAGNOSIS — I5042 Chronic combined systolic (congestive) and diastolic (congestive) heart failure: Secondary | ICD-10-CM | POA: Diagnosis not present

## 2019-04-18 DIAGNOSIS — I252 Old myocardial infarction: Secondary | ICD-10-CM

## 2019-04-18 DIAGNOSIS — I110 Hypertensive heart disease with heart failure: Secondary | ICD-10-CM | POA: Diagnosis not present

## 2019-04-18 DIAGNOSIS — R0609 Other forms of dyspnea: Secondary | ICD-10-CM

## 2019-04-18 DIAGNOSIS — R0600 Dyspnea, unspecified: Secondary | ICD-10-CM

## 2019-04-18 DIAGNOSIS — J432 Centrilobular emphysema: Secondary | ICD-10-CM

## 2019-04-18 NOTE — Patient Instructions (Signed)
Medication Instructions:  Your physician recommends that you continue on your current medications as directed. Please refer to the Current Medication list given to you today.  *If you need a refill on your cardiac medications before your next appointment, please call your pharmacy*  Lab Work: NONE If you have labs (blood work) drawn today and your tests are completely normal, you will receive your results only by:  [place] (if you have MyChart) OR  A paper copy in the mail If you have any lab test that is abnormal or we need to change your treatment, we will call you to review the results.  Testing/Procedures: Your physician has requested that you have an echocardiogram. Echocardiography is a painless test that uses sound waves to create images of your heart. It provides your doctor with information about the size and shape of your heart and how well your hearts chambers and valves are working. This procedure takes approximately one hour. There are no restrictions for this procedure.  Your physician has requested that you have a lexiscan myoview. For further information please visit [email or web address]. Please follow instruction sheet, as given.    Follow-Up: At [facility], you and your health needs are our priority.  As part of our continuing mission to provide you with exceptional heart care, we have created designated Provider Care Teams.  These Care Teams include your primary Cardiologist (physician) and Advanced Practice Providers (APPs -  Physician Assistants and Nurse Practitioners) who all work together to provide you with the care you need, when you need it.  Your next appointment:   3 month(s)  The format for your next appointment:   In Person  Provider:   Shirlee More, MD   Echocardiogram An echocardiogram is a procedure that uses painless sound waves (ultrasound) to produce an image of the heart. Images from an echocardiogram can provide important  information about:  Signs of coronary artery disease (CAD).  Aneurysm detection. An aneurysm is a weak or damaged part of an artery wall that bulges out from the normal force of blood pumping through the body.  Heart size and shape. Changes in the size or shape of the heart can be associated with certain conditions, including heart failure, aneurysm, and CAD.  Heart muscle function.  Heart valve function.  Signs of a past heart attack.  Fluid buildup around the heart.  Thickening of the heart muscle.  A tumor or infectious growth around the heart valves. Tell a health care provider about:  Any allergies you have.  All medicines you are taking, including vitamins, herbs, eye drops, creams, and over-the-counter medicines.  Any blood disorders you have.  Any surgeries you have had.  Any medical conditions you have.  Whether you are pregnant or may be pregnant. What are the risks? Generally, this is a safe procedure. However, problems may occur, including:  Allergic reaction to dye (contrast) that may be used during the procedure. What happens before the procedure? No specific preparation is needed. You may eat and drink normally. What happens during the procedure?   An IV tube may be inserted into one of your veins.  You may receive contrast through this tube. A contrast is an injection that improves the quality of the pictures from your heart.  A gel will be applied to your chest.  A wand-like tool (transducer) will be moved over your chest. The gel will help to transmit the sound waves from the transducer.  The sound waves will harmlessly bounce off  of your heart to allow the heart images to be captured in real-time motion. The images will be recorded on a computer. The procedure may vary among health care providers and hospitals. What happens after the procedure?  You may return to your normal, everyday life, including diet, activities, and medicines, unless your  health care provider tells you not to do that. Summary  An echocardiogram is a procedure that uses painless sound waves (ultrasound) to produce an image of the heart.  Images from an echocardiogram can provide important information about the size and shape of your heart, heart muscle function, heart valve function, and fluid buildup around your heart.  You do not need to do anything to prepare before this procedure. You may eat and drink normally.  After the echocardiogram is completed, you may return to your normal, everyday life, unless your health care provider tells you not to do that. This information is not intended to replace advice given to you by your health care provider. Make sure you discuss any questions you have with your health care provider. Document Released: 05/02/2000 Document Revised: 08/26/2018 Document Reviewed: 06/07/2016 Elsevier Patient Education  2020 [employer].    Cardiac Nuclear Scan A cardiac nuclear scan is a test that measures blood flow to the heart when a person is resting and when he or she is exercising. The test looks for problems such as:  Not enough blood reaching a portion of the heart.  The heart muscle not working normally. You may need this test if:  You have heart disease.  You have had abnormal lab results.  You have had heart surgery or a balloon procedure to open up blocked arteries (angioplasty).  You have chest pain.  You have shortness of breath. In this test, a radioactive dye (tracer) is injected into your bloodstream. After the tracer has traveled to your heart, an imaging device is used to measure how much of the tracer is absorbed by or distributed to various areas of your heart. This procedure is usually done at a hospital and takes 2-4 hours. Tell a health care provider about:  Any allergies you have.  All medicines you are taking, including vitamins, herbs, eye drops, creams, and over-the-counter medicines.  Any  problems you or family members have had with anesthetic medicines.  Any blood disorders you have.  Any surgeries you have had.  Any medical conditions you have.  Whether you are pregnant or may be pregnant. What are the risks? Generally, this is a safe procedure. However, problems may occur, including:  Serious chest pain and heart attack. This is only a risk if the stress portion of the test is done.  Rapid heartbeat.  Sensation of warmth in your chest. This usually passes quickly.  Allergic reaction to the tracer. What happens before the procedure?  Ask your health care provider about changing or stopping your regular medicines. This is especially important if you are taking diabetes medicines or blood thinners.  Follow instructions from your health care provider about eating or drinking restrictions.  Remove your jewelry on the day of the procedure. What happens during the procedure?  An IV will be inserted into one of your veins.  Your health care provider will inject a small amount of radioactive tracer through the IV.  You will wait for 20-40 minutes while the tracer travels through your bloodstream.  Your heart activity will be monitored with an electrocardiogram (ECG).  You will lie down on an exam table.  Images of your heart will be taken for about 15-20 minutes.  You may also have a stress test. For this test, one of the following may be done: ? You will exercise on a treadmill or stationary bike. While you exercise, your heart's activity will be monitored with an ECG, and your blood pressure will be checked. ? You will be given medicines that will increase blood flow to parts of your heart. This is done if you are unable to exercise.  When blood flow to your heart has peaked, a tracer will again be injected through the IV.  After 20-40 minutes, you will get back on the exam table and have more images taken of your heart.  Depending on the type of tracer  used, scans may need to be repeated 3-4 hours later.  Your IV line will be removed when the procedure is over. The procedure may vary among health care providers and hospitals. What happens after the procedure?  Unless your health care provider tells you otherwise, you may return to your normal schedule, including diet, activities, and medicines.  Unless your health care provider tells you otherwise, you may increase your fluid intake. This will help to flush the contrast dye from your body. Drink enough fluid to keep your urine pale yellow.  Ask your health care provider, or the department that is doing the test: ? When will my results be ready? ? How will I get my results? Summary  A cardiac nuclear scan measures the blood flow to the heart when a person is resting and when he or she is exercising.  Tell your health care provider if you are pregnant.  Before the procedure, ask your health care provider about changing or stopping your regular medicines. This is especially important if you are taking diabetes medicines or blood thinners.  After the procedure, unless your health care provider tells you otherwise, increase your fluid intake. This will help flush the contrast dye from your body.  After the procedure, unless your health care provider tells you otherwise, you may return to your normal schedule, including diet, activities, and medicines. This information is not intended to replace advice given to you by your health care provider. Make sure you discuss any questions you have with your health care provider. Document Released: 05/30/2004 Document Revised: 10/19/2017 Document Reviewed: 10/19/2017 Elsevier Patient Education  2020 [employer].

## 2019-05-25 ENCOUNTER — Telehealth: Payer: Self-pay | Admitting: *Deleted

## 2019-05-25 NOTE — Telephone Encounter (Signed)
Patient's grand daughter per DPR was given detailed instructions per Myocardial Perfusion Study Information Sheet for the test on 05/31/2019 at 1100. Patient notified to arrive 15 minutes early and that it is imperative to arrive on time for appointment to keep from having the test rescheduled.  If you need to cancel or reschedule your appointment, please call the office within 24 hours of your appointment. . Patient verbalized understanding.Tiffani Kadow, Ranae Palms

## 2019-05-31 ENCOUNTER — Ambulatory Visit (INDEPENDENT_AMBULATORY_CARE_PROVIDER_SITE_OTHER): Payer: Medicaid Other

## 2019-05-31 ENCOUNTER — Other Ambulatory Visit: Payer: Self-pay

## 2019-05-31 VITALS — Ht 62.0 in | Wt 161.0 lb

## 2019-05-31 DIAGNOSIS — R0602 Shortness of breath: Secondary | ICD-10-CM

## 2019-05-31 DIAGNOSIS — I2510 Atherosclerotic heart disease of native coronary artery without angina pectoris: Secondary | ICD-10-CM

## 2019-05-31 DIAGNOSIS — R0600 Dyspnea, unspecified: Secondary | ICD-10-CM | POA: Diagnosis not present

## 2019-05-31 DIAGNOSIS — I252 Old myocardial infarction: Secondary | ICD-10-CM

## 2019-05-31 MED ORDER — TECHNETIUM TC 99M TETROFOSMIN IV KIT
10.1000 | PACK | Freq: Once | INTRAVENOUS | Status: AC | PRN
  Administered 2019-05-31: 10.1 via INTRAVENOUS

## 2019-05-31 MED ORDER — REGADENOSON 0.4 MG/5ML IV SOLN
0.4000 mg | Freq: Once | INTRAVENOUS | Status: AC
  Administered 2019-05-31: 0.4 mg via INTRAVENOUS

## 2019-05-31 MED ORDER — TECHNETIUM TC 99M TETROFOSMIN IV KIT
31.4000 | PACK | Freq: Once | INTRAVENOUS | Status: AC | PRN
  Administered 2019-05-31: 31.4 via INTRAVENOUS

## 2019-06-01 LAB — MYOCARDIAL PERFUSION IMAGING
LV dias vol: 32 mL (ref 46–106)
LV sys vol: 6 mL
Peak HR: 120 {beats}/min
Rest HR: 87 {beats}/min
SRS: 1
SSS: 2
TID: 0.82

## 2019-06-02 ENCOUNTER — Telehealth: Payer: Self-pay | Admitting: Emergency Medicine

## 2019-06-02 NOTE — Telephone Encounter (Signed)
Left results to stress test on patients voicemail per dpr. Advised to call with any questions.

## 2019-06-14 ENCOUNTER — Other Ambulatory Visit: Payer: Self-pay

## 2019-06-14 ENCOUNTER — Ambulatory Visit (INDEPENDENT_AMBULATORY_CARE_PROVIDER_SITE_OTHER): Payer: Medicaid Other

## 2019-06-14 DIAGNOSIS — R0600 Dyspnea, unspecified: Secondary | ICD-10-CM | POA: Diagnosis not present

## 2019-06-15 ENCOUNTER — Telehealth: Payer: Self-pay

## 2019-06-15 NOTE — Telephone Encounter (Signed)
-----   Message from Richardo Priest, MD sent at 06/15/2019  3:33 PM EST ----- Normal or stable result  This is a good result she has had a marked improvement in her heart muscle function has normalized in response to medications no changes

## 2019-06-15 NOTE — Telephone Encounter (Signed)
lpmtcb 1/27

## 2019-06-16 ENCOUNTER — Telehealth: Payer: Self-pay

## 2019-06-16 NOTE — Telephone Encounter (Signed)
-----   Message from Richardo Priest, MD sent at 06/15/2019  3:33 PM EST ----- Normal or stable result  This is a good result she has had a marked improvement in her heart muscle function has normalized in response to medications no changes

## 2019-06-16 NOTE — Telephone Encounter (Signed)
Left message that echo was ok.

## 2019-08-01 ENCOUNTER — Ambulatory Visit: Payer: Medicaid Other | Admitting: Cardiology

## 2019-08-22 NOTE — Progress Notes (Signed)
Cardiology Office Note:    Date:  08/23/2019   ID:  Kathy Lawson, DOB [birth year], MRN [id number]  PCP:  Alanson Puls, [facility]  Cardiologist:  Shirlee More, MD    Referring MD: Alanson Puls, [facility] *    ASSESSMENT:    1. Chronic combined systolic and diastolic CHF (congestive heart failure) ([place])   2. Hypertensive heart disease with heart failure ([place])   3. Mixed hyperlipidemia   4. Centrilobular emphysema ([place])    PLAN:    In order of problems listed above:  1. She has had a remarkable improvement from a heart failure LV dysfunction type II myocardial infarction has resolved all signs and symptoms of heart failure and is normalized LV function has no demonstrable ischemia.  She reduce her diuretic to 3 days a week and hopefully in the future she can take it as needed.  I would continue her on standard medical therapy with aspirin beta-blocker and statin I see no indication referred for coronary angiography. 2. Stable hyperlipidemia continue statin and I will await labs from her primary care physician 3. Stable emphysema remains short of breath but is able to live a full life.  She will continue her current bronchodilators   Next appointment: As needed she will follow-up with her primary care physician   Medication Adjustments/Labs and Tests Ordered: Current medicines are reviewed at length with the patient today.  Concerns regarding medicines are outlined above.  No orders of the defined types were placed in this encounter.  No orders of the defined types were placed in this encounter.   Chief Complaint  Patient presents with  . Follow-up    After testing echocardiogram and Myoview    History of Present Illness:    Kathy Lawson is a 59 y.o. female with a hx of type II non-ST segment elevation in the setting of severe COPD profound respiratory distress and decompensated heart failure in the setting of IV fluid loading.  Her ejection fraction this year  was 35 to 40%.  She was seen by me in cardiology evaluation 07/29/2018 and at that time was not taking a diuretic had marked edema and was in decompensated heart failure.  During her hospitalization there was a recommendation she undergo coronary angiography and I told her that under general health condition and with comorbid morbidities I felt she was a poor candidate for elective coronary angiography.  She was placed back on her diuretic and I asked the family to consider whether we should not do a myocardial perfusion study or cardiac CTA for further evaluation once her heart failure was compensated.  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  She has also admitted to [facility] in November 2020 for pyelonephritis treated with IV antibiotics.  She was last seen 04/18/2019.  Compliance with diet, lifestyle and medications: Yes  I reviewed her testing with her.  She has chronic back pain but is not having edema orthopnea chest pain palpitation or syncope and due to her underlying lung disease she is short of breath walking outdoors for stairs.  She is pleased with the quality of her life and working to reduce her diuretic to 3 days a week.  She prefers to follow-up as needed L access labs done in the last few weeks in her PCP office.  She is tolerating her medications and has no muscle pain or weakness from  her statin  She had an echocardiogram 06/14/2019 that showed normal left ventricular function EF 60 to 65% dilation or left ventricular hypertrophy.  I independently reviewed myocardial perfusion study in January 2021 with normal left ventricular function and normal resting and stress perfusion.  This was a normal study. Past Medical History:  Diagnosis Date  . C. difficile colitis 08/2018  . Coronary artery disease   . Emphysema/COPD ([place])   . Hyperlipemia     . Hypertension   . Vertigo     Past Surgical History:  Procedure Laterality Date  . ABDOMINAL HYSTERECTOMY    . CHOLECYSTECTOMY  1980s  . HERNIA REPAIR    . hysterectomy  1999  . TUBAL LIGATION  1997    Current Medications: Current Meds  Medication Sig  . albuterol (PROVENTIL HFA;VENTOLIN HFA) 108 (90 BASE) MCG/ACT inhaler Inhale 2 puffs into the lungs every 4 (four) hours as needed for wheezing or shortness of breath.   Marland Kitchen aspirin EC 81 MG tablet Take 81 mg by mouth daily.  Marland Kitchen atorvastatin (LIPITOR) 40 MG tablet Take 40 mg by mouth at bedtime.   . budesonide-formoterol (SYMBICORT) 160-4.5 MCG/ACT inhaler Inhale 2 puffs into the lungs 2 (two) times daily.  . diphenoxylate-atropine (LOMOTIL) 2.5-0.025 MG tablet Take 2 tablets by mouth 3 (three) times daily before meals. For Crohn's disease  . escitalopram (LEXAPRO) 10 MG tablet Take 10 mg by mouth daily.  . folic acid (FOLVITE) 1 MG tablet Take 1 mg by mouth daily.  . furosemide (LASIX) 40 MG tablet Take 1 tablet (40 mg total) by mouth daily.  Marland Kitchen HYDROcodone-acetaminophen (NORCO/VICODIN) 5-325 MG tablet Take 1 tablet by mouth every 8 (eight) hours as needed for moderate pain.  Marland Kitchen ipratropium-albuterol (DUONEB) 0.5-2.5 (3) MG/3ML SOLN Take 3 mLs by nebulization every 6 (six) hours as needed (shortness of breath/wheezing).   . meloxicam (MOBIC) 15 MG tablet Take 15 mg by mouth daily.  . metoprolol succinate (TOPROL-XL) 25 MG 24 hr tablet Take 25 mg by mouth daily as needed (SBP >110).   . mometasone (NASONEX) 50 MCG/ACT nasal spray Place 2 sprays into the nose 2 (two) times daily as needed (congestion/allergies).   . montelukast (SINGULAIR) 10 MG tablet Take 10 mg by mouth at bedtime.  Marland Kitchen omeprazole (PRILOSEC) 40 MG capsule Take 40 mg by mouth daily before breakfast.  . tiZANidine (ZANAFLEX) 4 MG tablet Take 4 mg by mouth 3 (three) times daily as needed for muscle spasms.     Allergies:   Lasix [furosemide], Other, and Latex   Social  History   Socioeconomic History  . Marital status: Widowed    Spouse name: Not on file  . Number of children: Not on file  . Years of education: Not on file  . Highest education level: Not on file  Occupational History  . Not on file  Tobacco Use  . Smoking status: Former Smoker    Packs/day: 1.50    Types: Cigarettes    Start date: 05/19/1974    Quit date: 07/01/2018    Years since quitting: 1.1  . Smokeless tobacco: Never Used  Substance and Sexual Activity  . Alcohol use: Not Currently    Comment: 5-6 beers couple times weekly  . Drug use: No  . Sexual activity: Not on file  Other Topics Concern  . Not on file  Social History Narrative  . Not on file   Social Determinants of Health   Financial Resource Strain:   . Difficulty of  Paying Living Expenses:   Food Insecurity:   . Worried About Charity fundraiser in the Last Year:   . Arboriculturist in the Last Year:   Transportation Needs:   . Film/video editor (Medical):   Marland Kitchen Lack of Transportation (Non-Medical):   Physical Activity:   . Days of Exercise per Week:   . Minutes of Exercise per Session:   Stress:   . Feeling of Stress :   Social Connections:   . Frequency of Communication with Friends and Family:   . Frequency of Social Gatherings with Friends and Family:   . Attends Religious Services:   . Active Member of Clubs or Organizations:   . Attends Archivist Meetings:   Marland Kitchen Marital Status:      Family History: The patient's family history includes Allergies in her sister; Emphysema in her father; Rheum arthritis in her mother; Skin cancer in her mother. ROS:   Please see the history of present illness.    All other systems reviewed and are negative.  EKGs/Labs/Other Studies Reviewed:    The following studies were reviewed today:    Recent Labs: 03/21/2019: TSH 1.527 03/24/2019: ALT 10 03/25/2019: Magnesium 1.6 03/31/2019: BUN 8; Creatinine, Ser 0.86; Hemoglobin 10.4; Platelets 544;  Potassium 3.9; Sodium 140  Recent Lipid Panel No results found for: CHOL, TRIG, HDL, CHOLHDL, VLDL, LDLCALC, LDLDIRECT  Physical Exam:    VS:  BP 118/74   Pulse 63   Temp (!) 96.5 F (35.8 C)   Ht 5' 2"  (1.575 m)   Wt 162 lb 6.4 oz (73.7 kg)   SpO2 95%   BMI 29.70 kg/m     Wt Readings from Last 3 Encounters:  08/23/19 162 lb 6.4 oz (73.7 kg)  05/31/19 161 lb (73 kg)  04/18/19 161 lb 12.8 oz (73.4 kg)     GEN:  Well nourished, well developed in no acute distress HEENT: Normal NECK: No JVD; No carotid bruits LYMPHATICS: No lymphadenopathy CARDIAC: RRR, no murmurs, rubs, gallops RESPIRATORY:  Clear to auscultation without rales, wheezing or rhonchi  ABDOMEN: Soft, non-tender, non-distended MUSCULOSKELETAL:  No edema; No deformity  SKIN: Warm and dry NEUROLOGIC:  Alert and oriented x 3 PSYCHIATRIC:  Normal affect    Signed, Shirlee More, MD  08/23/2019 3:50 PM    [facility]

## 2019-08-23 ENCOUNTER — Other Ambulatory Visit: Payer: Self-pay

## 2019-08-23 ENCOUNTER — Ambulatory Visit (INDEPENDENT_AMBULATORY_CARE_PROVIDER_SITE_OTHER): Payer: Medicaid Other | Admitting: Cardiology

## 2019-08-23 ENCOUNTER — Encounter: Payer: Self-pay | Admitting: Cardiology

## 2019-08-23 VITALS — BP 118/74 | HR 63 | Temp 96.5°F | Ht 62.0 in | Wt 162.4 lb

## 2019-08-23 DIAGNOSIS — I110 Hypertensive heart disease with heart failure: Secondary | ICD-10-CM | POA: Diagnosis not present

## 2019-08-23 DIAGNOSIS — I5042 Chronic combined systolic (congestive) and diastolic (congestive) heart failure: Secondary | ICD-10-CM

## 2019-08-23 DIAGNOSIS — J432 Centrilobular emphysema: Secondary | ICD-10-CM | POA: Diagnosis not present

## 2019-08-23 DIAGNOSIS — E782 Mixed hyperlipidemia: Secondary | ICD-10-CM | POA: Diagnosis not present

## 2019-08-23 MED ORDER — FUROSEMIDE 40 MG PO TABS: 40.0000 mg | ORAL_TABLET | ORAL | Status: DC

## 2019-08-23 NOTE — Patient Instructions (Addendum)
Medication Instructions:  Your physician has recommended you make the following change in your medication:  DECREASE: Furosemide 40 mg take one tablet by mouth on Monday, Wednesday, and Friday. *If you need a refill on your cardiac medications before your next appointment, please call your pharmacy*   Lab Work: Your physician recommends that you return for lab work in: [place], ProBNP, Lipids If you have labs (blood work) drawn today and your tests are completely normal, you will receive your results only by: Marland Kitchen MyChart Message (if you have MyChart) OR . A paper copy in the mail If you have any lab test that is abnormal or we need to change your treatment, we will call you to review the results.   Testing/Procedures: None   Follow-Up: At [facility], you and your health needs are our priority.  As part of our continuing mission to provide you with exceptional heart care, we have created designated Provider Care Teams.  These Care Teams include your primary Cardiologist (physician) and Advanced Practice Providers (APPs -  Physician Assistants and Nurse Practitioners) who all work together to provide you with the care you need, when you need it.  We recommend signing up for the patient portal called "MyChart".  Sign up information is provided on this After Visit Summary.  MyChart is used to connect with patients for Virtual Visits (Telemedicine).  Patients are able to view lab/test results, encounter notes, upcoming appointments, etc.  Non-urgent messages can be sent to your provider as well.   To learn more about what you can do with MyChart, go to [email or web address].    Your next appointment:   As needed  The format for your next appointment:   In Person  Provider:   Shirlee More, MD   Other Instructions

## 2019-08-24 ENCOUNTER — Telehealth: Payer: Self-pay

## 2019-08-24 LAB — LIPID PANEL
Chol/HDL Ratio: 3.8 ratio (ref 0.0–4.4)
Cholesterol, Total: 162 mg/dL (ref 100–199)
HDL: 43 mg/dL (ref >39)
LDL Chol Calc (NIH): 94 mg/dL (ref 0–99)
Triglycerides: 140 mg/dL (ref 0–149)
VLDL Cholesterol Cal: 25 mg/dL (ref 5–40)

## 2019-08-24 LAB — COMPREHENSIVE METABOLIC PANEL
ALT: 15 IU/L (ref 0–32)
AST: 19 IU/L (ref 0–40)
Albumin/Globulin Ratio: 1.5 (ref 1.2–2.2)
Albumin: 3.7 g/dL — ABNORMAL LOW (ref 3.8–4.9)
Alkaline Phosphatase: 181 IU/L — ABNORMAL HIGH (ref 39–117)
BUN/Creatinine Ratio: 17 (ref 9–23)
BUN: 24 mg/dL (ref 6–24)
Bilirubin Total: <0.2 mg/dL (ref 0.0–1.2)
CO2: 23 mmol/L (ref 20–29)
Calcium: 9.3 mg/dL (ref 8.7–10.2)
Chloride: 104 mmol/L (ref 96–106)
Creatinine, Ser: 1.38 mg/dL — ABNORMAL HIGH (ref 0.57–1.00)
GFR calc Af Amer: 48 mL/min/{1.73_m2} — ABNORMAL LOW (ref >59)
GFR calc non Af Amer: 42 mL/min/{1.73_m2} — ABNORMAL LOW (ref >59)
Globulin, Total: 2.5 g/dL (ref 1.5–4.5)
Glucose: 75 mg/dL (ref 65–99)
Potassium: 4.8 mmol/L (ref 3.5–5.2)
Sodium: 141 mmol/L (ref 134–144)
Total Protein: 6.2 g/dL (ref 6.0–8.5)

## 2019-08-24 LAB — PRO B NATRIURETIC PEPTIDE: NT-Pro BNP: 1227 pg/mL — ABNORMAL HIGH (ref 0–287)

## 2019-08-24 NOTE — Telephone Encounter (Signed)
-----   Message from Richardo Priest, MD sent at 08/24/2019  8:13 AM EDT ----- Normal or stable result  No changes I decreased her diuretic yesterday

## 2019-08-24 NOTE — Telephone Encounter (Signed)
LMTCB if patient has any questions about lab results

## 2019-08-25 ENCOUNTER — Telehealth: Payer: Self-pay

## 2019-08-25 NOTE — Telephone Encounter (Signed)
-----   Message from Richardo Priest, MD sent at 08/24/2019  8:13 AM EDT ----- Normal or stable result  No changes I decreased her diuretic yesterday

## 2019-08-25 NOTE — Telephone Encounter (Signed)
Spoke with patients granddaughter Kathy Lawson and let her know that the lab results came back normal/stable. She states that she will let her grandmother know.    Encouraged them to call back with any questions or concerns.

## 2021-03-24 NOTE — Progress Notes (Signed)
Cardiology Office Note:    Date:  03/25/2021   ID:  Kathy Lawson, DOB 1960/12/09, MRN [id number]  PCP:  Alanson Puls, [facility]  Cardiologist:  Shirlee More, MD    Referring MD: Alanson Puls, [facility] *    ASSESSMENT:    1. Hypertensive heart disease with heart failure ([place])   2. History of MI (myocardial infarction)   3. Centrilobular emphysema ([place])   4. Mixed hyperlipidemia    PLAN:    In order of problems listed above:  Her heart failure is quite decompensated fluid overloaded but fortunately responded to restarting furosemide last week and have her increase her diuretic to twice daily until her weight gets back to 155 pounds.  If she fails to respond she will need IV diuretic.  She will begin to weigh her self daily record her weights sodium restrict I will see her back in the office in 4 weeks. Stable no indication of acute coronary syndrome continue aspirin low-dose beta-blocker statin Stable COPD managed by his prior PCP Continue her statin   Next appointment: 4 weeks   Medication Adjustments/Labs and Tests Ordered: Current medicines are reviewed at length with the patient today.  Concerns regarding medicines are outlined above.  No orders of the defined types were placed in this encounter.  No orders of the defined types were placed in this encounter.   Chief Complaint  Patient presents with   Follow-up   Congestive Heart Failure    History of Present Illness:    Kathy Lawson is a 60 y.o. female with a hx of type II non-ST elevation MI in the setting of severe COPD profound respiratory distress and decompensated heart failure due to IV fluid loading with initial ejection fraction of 35 to 40%.  She was seen in the office by me 07/29/2018 in decompensated heart failure.  She had subsequent hospitalizations both at [facility] and C. difficile colitis and toxic megacolon. She had an echocardiogram 06/14/2019 that  showed normal left ventricular function EF 60 to 65% dilation or left ventricular hypertrophy.  I independently reviewed myocardial perfusion study in January 2021 with normal left ventricular function and normal resting and stress perfusion.  This was a normal study.  She was last seen 08/23/2019 with remarkable improvement asymptomatic with heart failure and stable emphysema.  In particular overall health and comorbidities advised her not to undergo invasive cardiac evaluation..  Compliance with diet, lifestyle and medications: Yes  She had done well and her diuretic was discontinued and she is gained somewhere in the range of 25 to 30 pounds.  She has developed marked peripheral edema has two-pillow orthopnea but is not having orthopnea.  She was placed on Bumex for short period of time which she placed herself back on furosemide and has lost 10 pounds in the last few weeks.  Recent labs 03/12/2021 creatinine 1.04 potassium 4.7 sodium 137 BNP level was modestly elevated at 187 hemoglobin 10.7 Past Medical History:  Diagnosis Date   C. difficile colitis 08/2018   Coronary artery disease    Emphysema/COPD ([place])    Hyperlipemia    Hypertension    Vertigo     Past Surgical History:  Procedure Laterality Date   ABDOMINAL HYSTERECTOMY     CHOLECYSTECTOMY  1980s   HERNIA REPAIR     hysterectomy  1999   TUBAL LIGATION  1997    Current Medications: Current Meds  Medication Sig   albuterol (PROVENTIL HFA;VENTOLIN HFA) 108 (90 BASE)  MCG/ACT inhaler Inhale 2 puffs into the lungs every 4 (four) hours as needed for wheezing or shortness of breath.    amitriptyline (ELAVIL) 25 MG tablet Take 25 mg by mouth at bedtime.   aspirin EC 81 MG tablet Take 81 mg by mouth daily.   atorvastatin (LIPITOR) 40 MG tablet Take 1 tablet by mouth daily.   budesonide-formoterol (SYMBICORT) 160-4.5 MCG/ACT inhaler Inhale 2 puffs into the lungs 2 (two) times daily.   Cholecalciferol (VITAMIN D3) 1.25 MG (50000 UT)  CAPS Take 1 capsule by mouth once a week.   conjugated estrogens (PREMARIN) vaginal cream Place 1 application vaginally 2 (two) times a week.   diclofenac Sodium (VOLTAREN) 1 % GEL Apply 1 application topically 4 (four) times daily as needed for pain.   diphenoxylate-atropine (LOMOTIL) 2.5-0.025 MG tablet Take 2 tablets by mouth 3 (three) times daily before meals. For Crohn's disease   DULoxetine (CYMBALTA) 60 MG capsule Take 60 mg by mouth at bedtime.   escitalopram (LEXAPRO) 10 MG tablet Take 10 mg by mouth daily.   folic acid (FOLVITE) 1 MG tablet Take 1 mg by mouth daily.   furosemide (LASIX) 40 MG tablet Take 1 tablet (40 mg total) by mouth 3 (three) times a week. On Monday, Wednesday, and Friday.   furosemide (LASIX) 40 MG tablet Take 40 mg by mouth daily.   HYDROcodone-acetaminophen (NORCO/VICODIN) 5-325 MG tablet Take 1 tablet by mouth every 8 (eight) hours as needed for moderate pain.   ipratropium-albuterol (DUONEB) 0.5-2.5 (3) MG/3ML SOLN Take 3 mLs by nebulization every 6 (six) hours as needed (shortness of breath/wheezing).    levothyroxine (SYNTHROID) 50 MCG tablet Take 50 mcg by mouth daily.   metoprolol tartrate (LOPRESSOR) 25 MG tablet Take 25 mg by mouth 2 (two) times daily.   montelukast (SINGULAIR) 10 MG tablet Take 10 mg by mouth at bedtime.   omeprazole (PRILOSEC) 40 MG capsule Take 40 mg by mouth daily before breakfast.   ondansetron (ZOFRAN) 4 MG tablet Take 4 mg by mouth every 8 (eight) hours as needed for nausea or vomiting.   tiZANidine (ZANAFLEX) 4 MG tablet Take 4 mg by mouth 3 (three) times daily as needed for muscle spasms.     Allergies:   Lasix [furosemide], Macrobid [nitrofurantoin], Other, Sulfa antibiotics, and Latex   Social History   Socioeconomic History   Marital status: Widowed    Spouse name: Not on file   Number of children: Not on file   Years of education: Not on file   Highest education level: Not on file  Occupational History   Not on file   Tobacco Use   Smoking status: Former    Packs/day: 1.50    Types: Cigarettes    Start date: 05/19/1974    Quit date: 07/01/2018    Years since quitting: 2.7   Smokeless tobacco: Never  Vaping Use   Vaping Use: Never used  Substance and Sexual Activity   Alcohol use: Not Currently    Comment: 5-6 beers couple times weekly   Drug use: No   Sexual activity: Not on file  Other Topics Concern   Not on file  Social History Narrative   Not on file   Social Determinants of Health   Financial Resource Strain: Not on file  Food Insecurity: Not on file  Transportation Needs: Not on file  Physical Activity: Not on file  Stress: Not on file  Social Connections: Not on file     Family History: The  patient's family history includes Allergies in her sister; Emphysema in her father; Rheum arthritis in her mother; Skin cancer in her mother. ROS:   Please see the history of present illness.    All other systems reviewed and are negative.  EKGs/Labs/Other Studies Reviewed:    The following studies were reviewed see history   Physical Exam:    VS:  BP (!) 150/82 (BP Location: Right Arm, Patient Position: Sitting, Cuff Size: Normal)   Pulse 66   Ht 5' 2"  (1.575 m)   Wt 181 lb (82.1 kg)   SpO2 95%   BMI 33.11 kg/m     Wt Readings from Last 3 Encounters:  03/25/21 181 lb (82.1 kg)  08/23/19 162 lb 6.4 oz (73.7 kg)  05/31/19 161 lb (73 kg)     GEN:  Well nourished, well developed in no acute distress HEENT: Normal NECK: No JVD; No carotid bruits LYMPHATICS: No lymphadenopathy CARDIAC: RRR, no murmurs, rubs, gallops RESPIRATORY:  Clear to auscultation without rales, wheezing or rhonchi  ABDOMEN: Soft, non-tender, non-distended MUSCULOSKELETAL: She has greater than 4+ edema to the umbilicus edema; No deformity  SKIN: Warm and dry NEUROLOGIC:  Alert and oriented x 3 PSYCHIATRIC:  Normal affect    Signed, Shirlee More, MD  03/25/2021 3:12 PM    [facility]

## 2021-03-25 ENCOUNTER — Encounter: Payer: Self-pay | Admitting: Cardiology

## 2021-03-25 ENCOUNTER — Ambulatory Visit (INDEPENDENT_AMBULATORY_CARE_PROVIDER_SITE_OTHER): Payer: Medicaid Other | Admitting: Cardiology

## 2021-03-25 ENCOUNTER — Other Ambulatory Visit: Payer: Self-pay

## 2021-03-25 VITALS — BP 150/82 | HR 66 | Ht 62.0 in | Wt 181.0 lb

## 2021-03-25 DIAGNOSIS — E782 Mixed hyperlipidemia: Secondary | ICD-10-CM | POA: Diagnosis not present

## 2021-03-25 DIAGNOSIS — I110 Hypertensive heart disease with heart failure: Secondary | ICD-10-CM | POA: Diagnosis not present

## 2021-03-25 DIAGNOSIS — J432 Centrilobular emphysema: Secondary | ICD-10-CM

## 2021-03-25 DIAGNOSIS — I252 Old myocardial infarction: Secondary | ICD-10-CM | POA: Diagnosis not present

## 2021-03-25 MED ORDER — FUROSEMIDE 40 MG PO TABS: 40.0000 mg | ORAL_TABLET | Freq: Two times a day (BID) | ORAL | 3 refills | Status: DC

## 2021-03-25 NOTE — Patient Instructions (Signed)
Medication Instructions:  Your physician has recommended you make the following change in your medication:  INCREAE: Furosemide 40 mg take one tablet by mouth twice daily until your weight is down to 155 lbs. Then take once daily.  *If you need a refill on your cardiac medications before your next appointment, please call your pharmacy*   Lab Work: None If you have labs (blood work) drawn today and your tests are completely normal, you will receive your results only by: [place] (if you have MyChart) OR A paper copy in the mail If you have any lab test that is abnormal or we need to change your treatment, we will call you to review the results.   Testing/Procedures: None   Follow-Up: At [facility], you and your health needs are our priority.  As part of our continuing mission to provide you with exceptional heart care, we have created designated Provider Care Teams.  These Care Teams include your primary Cardiologist (physician) and Advanced Practice Providers (APPs -  Physician Assistants and Nurse Practitioners) who all work together to provide you with the care you need, when you need it.  We recommend signing up for the patient portal called "MyChart".  Sign up information is provided on this After Visit Summary.  MyChart is used to connect with patients for Virtual Visits (Telemedicine).  Patients are able to view lab/test results, encounter notes, upcoming appointments, etc.  Non-urgent messages can be sent to your provider as well.   To learn more about what you can do with MyChart, go to [email or web address].    Your next appointment:   4 week(s)  The format for your next appointment:   In Person  Provider:   Shirlee More, MD    Other Instructions

## 2021-03-25 NOTE — Addendum Note (Signed)
Addended by: Resa Miner I on: 03/25/2021 03:16 PM   Modules accepted: Orders

## 2021-04-02 IMAGING — DX DG CHEST 1V PORT
1 series · 1 of 1 positions shown · non-contrast
Comparison: March 21, 2019

CLINICAL DATA: Leukocytosis

EXAM:
PORTABLE CHEST 1 VIEW

[chest ap]
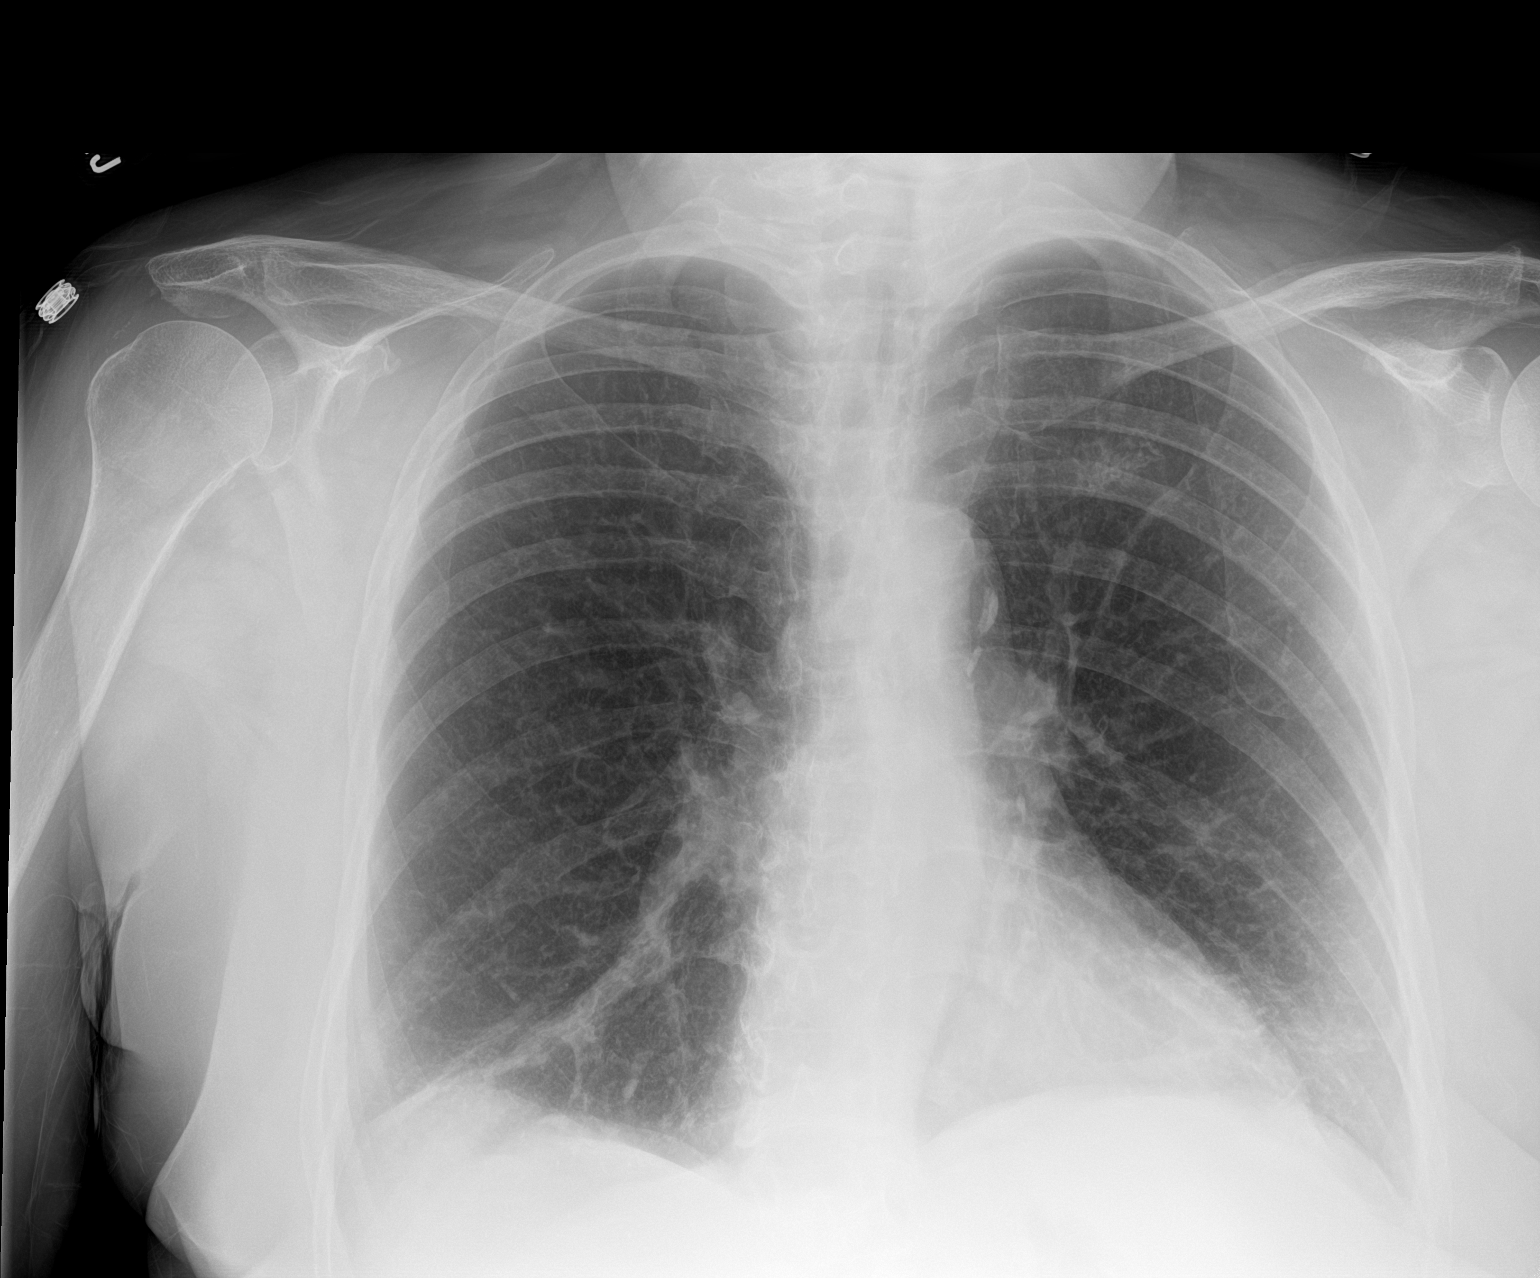

[1 of 1 positions shown; findings below may reference images not displayed]

FINDINGS: No pneumothorax. Mild atelectasis in the bases. No focal
infiltrates. The cardiomediastinal silhouette is stable. No other
acute abnormalities.
IMPRESSION: Mild atelectasis in the bases. No other acute abnormalities are
identified.

## 2021-04-23 DIAGNOSIS — I10 Essential (primary) hypertension: Secondary | ICD-10-CM | POA: Insufficient documentation

## 2021-04-23 DIAGNOSIS — R42 Dizziness and giddiness: Secondary | ICD-10-CM | POA: Insufficient documentation

## 2021-04-28 NOTE — Progress Notes (Signed)
Cardiology Office Note:    Date:  04/29/2021   ID:  Kathy Lawson, DOB 1960-11-21, MRN [id number]  PCP:  Alanson Puls, [facility]  Cardiologist:  Shirlee More, MD    Referring MD: Alanson Puls, [facility] *    ASSESSMENT:    1. Hypertensive heart disease with heart failure ([place])   2. Centrilobular emphysema ([place])    PLAN:    In order of problems listed above:  She has much improved heart failure evident in the right direction she will continue on higher dose diuretic she has labs checked frequently at PCP office and they manage renal function potassium and I will plan to see back in my office in 6 months.  Continue her beta-blocker Stable continue current bronchodilators  Next appointment: 6 months   Medication Adjustments/Labs and Tests Ordered: Current medicines are reviewed at length with the patient today.  Concerns regarding medicines are outlined above.  No orders of the defined types were placed in this encounter.  No orders of the defined types were placed in this encounter.   Chief Complaint  Patient presents with   Follow-up   Coronary Artery Disease   History of Present Illness:    Kathy Lawson is a 60 y.o. female with a hx of CAD with type II non-ST elevation MI in the setting of severe COPD profound respiratory distress and decompensated heart failure due to IV fluid loading with an initial ejection fraction of 35 to 40%.  She was seen in the office by me 07/29/2018 in decompensated heart failure.  She had subsequent hospitalizations both at moderate [facility] and C. difficile colitis and toxic megacolon. She had an echocardiogram 06/14/2019 that showed normal left ventricular function EF 60 to 65% dilation or left ventricular hypertrophy.  I independently reviewed myocardial perfusion study in January 2021 with normal left ventricular function and normal resting and stress perfusion.  This was a normal study.  She was   seen 08/23/2019 with remarkable improvement asymptomatic with heart failure and stable emphysema.  In particular overall health and comorbidities advised her not to undergo invasive cardiac evaluation.  She was last seen 03/25/2021 after a weight gain of 25 to 30 pounds with severely decompensated heart failure treated with increasing doses of diuretic Compliance with diet, lifestyle and medications: Yes  Her daughter is present participates in evaluation and decision making. Her weight is down at home more than 10 pounds and she is better much less edema sleeping comfortably on 2 pillows no PND and back to her usual shortness of breath.  No chest pain palpitation or syncope.  She had recent labs in her PCP office and was put on potassium supplement at the end of November Past Medical History:  Diagnosis Date   C. difficile colitis 08/2018   Coronary artery disease    Emphysema/COPD ([place])    Hyperlipemia    Hypertension    Vertigo     Past Surgical History:  Procedure Laterality Date   ABDOMINAL HYSTERECTOMY     CHOLECYSTECTOMY  1980s   HERNIA REPAIR     hysterectomy  1999   TUBAL LIGATION  1997    Current Medications: Current Meds  Medication Sig   albuterol (PROVENTIL HFA;VENTOLIN HFA) 108 (90 BASE) MCG/ACT inhaler Inhale 2 puffs into the lungs every 4 (four) hours as needed for wheezing or shortness of breath.    amitriptyline (ELAVIL) 25 MG tablet Take 25 mg by mouth at bedtime.   aspirin EC 81  MG tablet Take 81 mg by mouth daily.   atorvastatin (LIPITOR) 40 MG tablet Take 1 tablet by mouth daily.   budesonide-formoterol (SYMBICORT) 160-4.5 MCG/ACT inhaler Inhale 2 puffs into the lungs 2 (two) times daily.   Cholecalciferol (VITAMIN D3) 1.25 MG (50000 UT) CAPS Take 1 capsule by mouth once a week.   conjugated estrogens (PREMARIN) vaginal cream Place 1 application vaginally 2 (two) times a week.   diclofenac Sodium (VOLTAREN) 1 % GEL Apply 1 application topically 4 (four) times  daily as needed for pain.   diphenoxylate-atropine (LOMOTIL) 2.5-0.025 MG tablet Take 2 tablets by mouth 3 (three) times daily before meals. For Crohn's disease   DULoxetine (CYMBALTA) 60 MG capsule Take 60 mg by mouth at bedtime.   folic acid (FOLVITE) 1 MG tablet Take 1 mg by mouth daily.   furosemide (LASIX) 40 MG tablet Take 1 tablet (40 mg total) by mouth 2 (two) times daily.   HYDROcodone-acetaminophen (NORCO/VICODIN) 5-325 MG tablet Take 1 tablet by mouth every 8 (eight) hours as needed for moderate pain.   ipratropium-albuterol (DUONEB) 0.5-2.5 (3) MG/3ML SOLN Take 3 mLs by nebulization every 6 (six) hours as needed (shortness of breath/wheezing).    levothyroxine (SYNTHROID) 50 MCG tablet Take 50 mcg by mouth daily.   metoprolol tartrate (LOPRESSOR) 25 MG tablet Take 25 mg by mouth 2 (two) times daily.   montelukast (SINGULAIR) 10 MG tablet Take 10 mg by mouth at bedtime.   omeprazole (PRILOSEC) 40 MG capsule Take 40 mg by mouth daily before breakfast.   ondansetron (ZOFRAN) 4 MG tablet Take 4 mg by mouth every 8 (eight) hours as needed for nausea or vomiting.   tiZANidine (ZANAFLEX) 4 MG tablet Take 4 mg by mouth 3 (three) times daily as needed for muscle spasms.     Allergies:   Lasix [furosemide], Macrobid [nitrofurantoin], Other, Sulfa antibiotics, and Latex   Social History   Socioeconomic History   Marital status: Widowed    Spouse name: Not on file   Number of children: Not on file   Years of education: Not on file   Highest education level: Not on file  Occupational History   Not on file  Tobacco Use   Smoking status: Former    Packs/day: 1.50    Types: Cigarettes    Start date: 05/19/1974    Quit date: 07/01/2018    Years since quitting: 2.8   Smokeless tobacco: Never  Vaping Use   Vaping Use: Never used  Substance and Sexual Activity   Alcohol use: Not Currently    Comment: 5-6 beers couple times weekly   Drug use: No   Sexual activity: Not on file  Other  Topics Concern   Not on file  Social History Narrative   Not on file   Social Determinants of Health   Financial Resource Strain: Not on file  Food Insecurity: Not on file  Transportation Needs: Not on file  Physical Activity: Not on file  Stress: Not on file  Social Connections: Not on file     Family History: The patient's family history includes Allergies in her sister; Emphysema in her father; Rheum arthritis in her mother; Skin cancer in her mother. ROS:   Please see the history of present illness.    All other systems reviewed and are negative.  EKGs/Labs/Other Studies Reviewed:    The following studies were reviewed today:   Recent Labs: No results found for requested labs within last 8760 hours.  Recent Lipid  Panel    Component Value Date/Time   CHOL 162 08/23/2019 1601   TRIG 140 08/23/2019 1601   HDL 43 08/23/2019 1601   CHOLHDL 3.8 08/23/2019 1601   LDLCALC 94 08/23/2019 1601    Physical Exam:    VS:  BP 110/70   Pulse 78   Ht 5' 2"  (1.575 m)   Wt 171 lb 9.6 oz (77.8 kg)   SpO2 97%   BMI 31.39 kg/m     Wt Readings from Last 3 Encounters:  04/29/21 171 lb 9.6 oz (77.8 kg)  03/25/21 181 lb (82.1 kg)  08/23/19 162 lb 6.4 oz (73.7 kg)     GEN:  Well nourished, well developed in no acute distress HEENT: Normal NECK: No JVD; No carotid bruits LYMPHATICS: No lymphadenopathy CARDIAC: RRR, no murmurs, rubs, gallops RESPIRATORY:  Clear to auscultation without rales, wheezing or rhonchi  ABDOMEN: Soft, non-tender, non-distended MUSCULOSKELETAL: 1-2+ bilateral lower extremity pitting edema; No deformity  SKIN: Warm and dry NEUROLOGIC:  Alert and oriented x 3 PSYCHIATRIC:  Normal affect    Signed, Shirlee More, MD  04/29/2021 3:19 PM    [facility]

## 2021-04-29 ENCOUNTER — Encounter: Payer: Self-pay | Admitting: Cardiology

## 2021-04-29 ENCOUNTER — Other Ambulatory Visit: Payer: Self-pay

## 2021-04-29 ENCOUNTER — Ambulatory Visit (INDEPENDENT_AMBULATORY_CARE_PROVIDER_SITE_OTHER): Payer: Medicaid Other | Admitting: Cardiology

## 2021-04-29 VITALS — BP 110/70 | HR 78 | Ht 62.0 in | Wt 171.6 lb

## 2021-04-29 DIAGNOSIS — I110 Hypertensive heart disease with heart failure: Secondary | ICD-10-CM | POA: Diagnosis not present

## 2021-04-29 DIAGNOSIS — J432 Centrilobular emphysema: Secondary | ICD-10-CM

## 2021-04-29 NOTE — Patient Instructions (Signed)
Medication Instructions:  Your physician recommends that you continue on your current medications as directed. Please refer to the Current Medication list given to you today.  *If you need a refill on your cardiac medications before your next appointment, please call your pharmacy*   Lab Work: None ordered If you have labs (blood work) drawn today and your tests are completely normal, you will receive your results only by: [place] (if you have MyChart) OR A paper copy in the mail If you have any lab test that is abnormal or we need to change your treatment, we will call you to review the results.   Testing/Procedures: None ordered   Follow-Up: At [facility], you and your health needs are our priority.  As part of our continuing mission to provide you with exceptional heart care, we have created designated Provider Care Teams.  These Care Teams include your primary Cardiologist (physician) and Advanced Practice Providers (APPs -  Physician Assistants and Nurse Practitioners) who all work together to provide you with the care you need, when you need it.  We recommend signing up for the patient portal called "MyChart".  Sign up information is provided on this After Visit Summary.  MyChart is used to connect with patients for Virtual Visits (Telemedicine).  Patients are able to view lab/test results, encounter notes, upcoming appointments, etc.  Non-urgent messages can be sent to your provider as well.   To learn more about what you can do with MyChart, go to [email or web address].    Your next appointment:   6 month(s)  The format for your next appointment:   In Person  Provider:   Shirlee More, MD   Other Instructions NA

## 2021-10-26 NOTE — Progress Notes (Unsigned)
Cardiology Office Note:    Date:  10/28/2021   ID:  Kathy Lawson, DOB [birth year], MRN [id number]  PCP:  Alanson Puls, [facility]  Cardiologist:  Shirlee More, MD    Referring MD: Alanson Puls, [facility] *    ASSESSMENT:    1. Hypertensive heart disease with heart failure ([place])   2. CAD in native artery   3. Centrilobular emphysema ([place])   4. Mixed hyperlipidemia    PLAN:    In order of problems listed above:  Kathy Lawson continues to do well no fluid overload and she takes her diuretic as needed.  BP is at target on a good medical regimen including beta-blocker.  Note her ejection fraction is normalized Stable CAD having no anginal discomfort continue treatment including aspirin and a beta-blocker and lipid-lowering therapy Stable COPD Continue statin due for labs in her PCP office   Next appointment: 1 year   Medication Adjustments/Labs and Tests Ordered: Current medicines are reviewed at length with the patient today.  Concerns regarding medicines are outlined above.  No orders of the defined types were placed in this encounter.  No orders of the defined types were placed in this encounter.   Follow-up heart failure   History of Present Illness:    Kathy Lawson is a 61 y.o. female with a hx of CAD with type II non-ST segment elevation in the setting of severe COPD profound respiratory distress and decompensated heart failure with IV fluid loading initial ejection fraction 35 to 40%.  When seen in the office in March she was in decompensated heart failure she had subsequent hospitalizations both at [place] in [facility] with toxic megacolon C. difficile colitis.  Echocardiogram in January 2021 showed normal ejection fraction and she has had a myocardial perfusion study low risk with no ischemia.  Her other comorbidities include severe COPD.  She was seen 04/29/2021.Marland Kitchen  Compliance with diet, lifestyle and medications: Yes  In the interim she  had incisional hernia repair [facility] 41/66/0630 uncomplicated EKG performed at the time of surgery showed sinus rhythm low voltage pulmonary disease pattern.  She continues to do well not having edema shortness of breath chest pain palpitation or syncope Past Medical History:  Diagnosis Date   C. difficile colitis 08/2018   Coronary artery disease    Emphysema/COPD (HCC)    Hyperlipemia    Hypertension    Vertigo     Past Surgical History:  Procedure Laterality Date   ABDOMINAL HYSTERECTOMY     CHOLECYSTECTOMY  1980s   HERNIA REPAIR     hysterectomy  1999   TUBAL LIGATION  1997    Current Medications: Current Meds  Medication Sig   albuterol (PROVENTIL HFA;VENTOLIN HFA) 108 (90 BASE) MCG/ACT inhaler Inhale 2 puffs into the lungs every 4 (four) hours as needed for wheezing or shortness of breath.    amitriptyline (ELAVIL) 25 MG tablet Take 25 mg by mouth at bedtime.   aspirin EC 81 MG tablet Take 81 mg by mouth daily.   atorvastatin (LIPITOR) 40 MG tablet Take 1 tablet by mouth daily.   budesonide-formoterol (SYMBICORT) 160-4.5 MCG/ACT inhaler Inhale 2 puffs into the lungs 2 (two) times daily.   Cholecalciferol (VITAMIN D3) 1.25 MG (50000 UT) CAPS Take 1 capsule by mouth once a week.   conjugated estrogens (PREMARIN) vaginal cream Place 1 application vaginally 2 (two) times a week.   diclofenac Sodium (VOLTAREN) 1 % GEL Apply 1 application topically 4 (four) times  daily as needed for pain.   diphenoxylate-atropine (LOMOTIL) 2.5-0.025 MG tablet Take 2 tablets by mouth 3 (three) times daily before meals. For Crohn's disease   DULoxetine (CYMBALTA) 60 MG capsule Take 60 mg by mouth at bedtime.   folic acid (FOLVITE) 1 MG tablet Take 1 mg by mouth daily.   HYDROcodone-acetaminophen (NORCO/VICODIN) 5-325 MG tablet Take 1 tablet by mouth every 8 (eight) hours as needed for moderate pain.   ipratropium-albuterol (DUONEB) 0.5-2.5 (3) MG/3ML SOLN Take 3 mLs by  nebulization every 6 (six) hours as needed (shortness of breath/wheezing).    levothyroxine (SYNTHROID) 50 MCG tablet Take 50 mcg by mouth daily.   metoprolol tartrate (LOPRESSOR) 25 MG tablet Take 25 mg by mouth 2 (two) times daily.   montelukast (SINGULAIR) 10 MG tablet Take 10 mg by mouth at bedtime.   omeprazole (PRILOSEC) 40 MG capsule Take 40 mg by mouth daily before breakfast.   ondansetron (ZOFRAN) 4 MG tablet Take 4 mg by mouth every 8 (eight) hours as needed for nausea or vomiting.   tiZANidine (ZANAFLEX) 4 MG tablet Take 4 mg by mouth 3 (three) times daily as needed for muscle spasms.     Allergies:   Lasix [furosemide], Macrobid [nitrofurantoin], Other, Sulfa antibiotics, and Latex   Social History   Socioeconomic History   Marital status: Widowed    Spouse name: Not on file   Number of children: Not on file   Years of education: Not on file   Highest education level: Not on file  Occupational History   Not on file  Tobacco Use   Smoking status: Former    Packs/day: 1.50    Types: Cigarettes    Start date: 05/19/1974    Quit date: 07/01/2018    Years since quitting: 3.3    Passive exposure: Past   Smokeless tobacco: Never  Vaping Use   Vaping Use: Never used  Substance and Sexual Activity   Alcohol use: Not Currently    Comment: 5-6 beers couple times weekly   Drug use: No   Sexual activity: Not on file  Other Topics Concern   Not on file  Social History Narrative   Not on file   Social Determinants of Health   Financial Resource Strain: Not on file  Food Insecurity: Not on file  Transportation Needs: Not on file  Physical Activity: Not on file  Stress: Not on file  Social Connections: Not on file     Family History: The patient's family history includes Allergies in her sister; Emphysema in her father; Rheum arthritis in her mother; Skin cancer in her mother. ROS:   Please see the history of present illness.    All other systems reviewed and are  negative.  EKGs/Labs/Other Studies Reviewed:    The following studies were reviewed today:   Recent Labs: No results found for requested labs within last 365 days.  Recent Lipid Panel    Component Value Date/Time   CHOL 162 08/23/2019 1601   TRIG 140 08/23/2019 1601   HDL 43 08/23/2019 1601   CHOLHDL 3.8 08/23/2019 1601   LDLCALC 94 08/23/2019 1601    Physical Exam:    VS:  BP 116/74 (BP Location: Left Arm, Patient Position: Sitting)   Pulse 80   Ht 5' 2"  (1.575 m)   Wt 157 lb 6.4 oz (71.4 kg)   SpO2 94%   BMI 28.79 kg/m     Wt Readings from Last 3 Encounters:  10/28/21 157 lb 6.4  oz (71.4 kg)  04/29/21 171 lb 9.6 oz (77.8 kg)  03/25/21 181 lb (82.1 kg)     GEN:  Well nourished, well developed in no acute distress HEENT: Normal NECK: No JVD; No carotid bruits LYMPHATICS: No lymphadenopathy CARDIAC: RRR, no murmurs, rubs, gallops RESPIRATORY:  Clear to auscultation without rales, wheezing or rhonchi  ABDOMEN: Soft, non-tender, non-distended MUSCULOSKELETAL:  No edema; No deformity  SKIN: Warm and dry NEUROLOGIC:  Alert and oriented x 3 PSYCHIATRIC:  Normal affect    Signed, Shirlee More, MD  10/28/2021 3:20 PM    [facility]

## 2021-10-28 ENCOUNTER — Ambulatory Visit (INDEPENDENT_AMBULATORY_CARE_PROVIDER_SITE_OTHER): Payer: Medicaid Other | Admitting: Cardiology

## 2021-10-28 ENCOUNTER — Encounter: Payer: Self-pay | Admitting: Cardiology

## 2021-10-28 VITALS — BP 116/74 | HR 80 | Ht 62.0 in | Wt 157.4 lb

## 2021-10-28 DIAGNOSIS — E782 Mixed hyperlipidemia: Secondary | ICD-10-CM

## 2021-10-28 DIAGNOSIS — J432 Centrilobular emphysema: Secondary | ICD-10-CM | POA: Diagnosis not present

## 2021-10-28 DIAGNOSIS — I110 Hypertensive heart disease with heart failure: Secondary | ICD-10-CM | POA: Diagnosis not present

## 2021-10-28 DIAGNOSIS — I2510 Atherosclerotic heart disease of native coronary artery without angina pectoris: Secondary | ICD-10-CM | POA: Diagnosis not present

## 2021-10-28 NOTE — Patient Instructions (Signed)
Medication Instructions:  Your physician recommends that you continue on your current medications as directed. Please refer to the Current Medication list given to you today.  *If you need a refill on your cardiac medications before your next appointment, please call your pharmacy*   Lab Work: None If you have labs (blood work) drawn today and your tests are completely normal, you will receive your results only by: [place] (if you have MyChart) OR A paper copy in the mail If you have any lab test that is abnormal or we need to change your treatment, we will call you to review the results.   Testing/Procedures: None   Follow-Up: At [facility], you and your health needs are our priority.  As part of our continuing mission to provide you with exceptional heart care, we have created designated Provider Care Teams.  These Care Teams include your primary Cardiologist (physician) and Advanced Practice Providers (APPs -  Physician Assistants and Nurse Practitioners) who all work together to provide you with the care you need, when you need it.  We recommend signing up for the patient portal called "MyChart".  Sign up information is provided on this After Visit Summary.  MyChart is used to connect with patients for Virtual Visits (Telemedicine).  Patients are able to view lab/test results, encounter notes, upcoming appointments, etc.  Non-urgent messages can be sent to your provider as well.   To learn more about what you can do with MyChart, go to [email or web address].    Your next appointment:   1 year(s)  The format for your next appointment:   In Person  Provider:   Shirlee More, MD    Other Instructions None  Important Information About Sugar

## 2022-04-24 ENCOUNTER — Other Ambulatory Visit: Payer: Self-pay | Admitting: Cardiology

## 2024-02-22 DIAGNOSIS — L630 Alopecia (capitis) totalis: Secondary | ICD-10-CM | POA: Insufficient documentation

## 2024-02-22 DIAGNOSIS — K219 Gastro-esophageal reflux disease without esophagitis: Secondary | ICD-10-CM | POA: Insufficient documentation

## 2024-02-22 DIAGNOSIS — N3090 Cystitis, unspecified without hematuria: Secondary | ICD-10-CM | POA: Insufficient documentation

## 2024-02-22 DIAGNOSIS — K5080 Crohn's disease of both small and large intestine without complications: Secondary | ICD-10-CM | POA: Insufficient documentation

## 2024-02-22 DIAGNOSIS — E039 Hypothyroidism, unspecified: Secondary | ICD-10-CM | POA: Insufficient documentation

## 2024-02-22 DIAGNOSIS — R300 Dysuria: Secondary | ICD-10-CM | POA: Insufficient documentation

## 2024-02-22 DIAGNOSIS — A681 Tick-borne relapsing fever: Secondary | ICD-10-CM | POA: Insufficient documentation

## 2024-02-22 DIAGNOSIS — D518 Other vitamin B12 deficiency anemias: Secondary | ICD-10-CM | POA: Insufficient documentation

## 2024-02-22 DIAGNOSIS — M109 Gout, unspecified: Secondary | ICD-10-CM | POA: Insufficient documentation

## 2024-02-22 DIAGNOSIS — G894 Chronic pain syndrome: Secondary | ICD-10-CM | POA: Insufficient documentation

## 2024-02-22 DIAGNOSIS — E559 Vitamin D deficiency, unspecified: Secondary | ICD-10-CM | POA: Insufficient documentation

## 2024-02-22 DIAGNOSIS — E119 Type 2 diabetes mellitus without complications: Secondary | ICD-10-CM | POA: Insufficient documentation

## 2024-02-22 DIAGNOSIS — M5430 Sciatica, unspecified side: Secondary | ICD-10-CM | POA: Insufficient documentation

## 2024-02-22 DIAGNOSIS — Z5181 Encounter for therapeutic drug level monitoring: Secondary | ICD-10-CM | POA: Insufficient documentation

## 2024-02-22 DIAGNOSIS — M179 Osteoarthritis of knee, unspecified: Secondary | ICD-10-CM | POA: Insufficient documentation

## 2024-02-22 DIAGNOSIS — G4700 Insomnia, unspecified: Secondary | ICD-10-CM | POA: Insufficient documentation

## 2024-02-22 DIAGNOSIS — F331 Major depressive disorder, recurrent, moderate: Secondary | ICD-10-CM | POA: Insufficient documentation

## 2024-02-22 DIAGNOSIS — D510 Vitamin B12 deficiency anemia due to intrinsic factor deficiency: Secondary | ICD-10-CM | POA: Insufficient documentation

## 2024-02-22 DIAGNOSIS — M51369 Other intervertebral disc degeneration, lumbar region without mention of lumbar back pain or lower extremity pain: Secondary | ICD-10-CM | POA: Insufficient documentation

## 2024-02-22 DIAGNOSIS — J441 Chronic obstructive pulmonary disease with (acute) exacerbation: Secondary | ICD-10-CM | POA: Insufficient documentation

## 2024-02-22 DIAGNOSIS — G9332 Myalgic encephalomyelitis/chronic fatigue syndrome: Secondary | ICD-10-CM | POA: Insufficient documentation

## 2024-02-22 DIAGNOSIS — D72829 Elevated white blood cell count, unspecified: Secondary | ICD-10-CM | POA: Insufficient documentation

## 2024-02-22 DIAGNOSIS — M1991 Primary osteoarthritis, unspecified site: Secondary | ICD-10-CM | POA: Insufficient documentation

## 2024-02-22 DIAGNOSIS — H2589 Other age-related cataract: Secondary | ICD-10-CM | POA: Insufficient documentation

## 2024-02-22 DIAGNOSIS — D511 Vitamin B12 deficiency anemia due to selective vitamin B12 malabsorption with proteinuria: Secondary | ICD-10-CM | POA: Insufficient documentation

## 2024-02-22 NOTE — Progress Notes (Unsigned)
 Cardiology Office Note:    Date:  02/24/2024   ID:  Kathy Lawson, DOB 11-18-1960, MRN [id number]  PCP:  Roni, [facility]  Cardiologist:  Redell Leiter, MD    Referring MD: [facility] *    ASSESSMENT:    1. Coronary artery disease involving native coronary artery of native heart without angina pectoris   2. Chronic obstructive pulmonary disease with emphysema, unspecified emphysema type (HCC)   3. Hypertensive heart disease with heart failure (HCC)   4. Mixed hyperlipidemia   5. APC (atrial premature contractions)   6. PAT (paroxysmal atrial tachycardia)    PLAN:    In order of problems listed above:  I think her primary problem now is sinus tachycardia in the context of severe decompensated heart failure Resume her diuretic add a low-dose sustained-release metoprolol  and distal diuretic spironolactone Check renal function proBNP today repeat in 1 week Weigh daily and bring me a list of her weights in 1 week Recheck echocardiogram suspect her ejection fraction is diminished again if that is the case we will need to use other guideline directed treatment ARB and/or Entresto and SGLT2 inhibitor We call the office to see if we get a copy of that monitor After receiving the monitor it is obvious that atrial arrhythmia is problematic and fortunately had restarted her on her previous beta-blocker yesterday.   Next appointment: 4 to 6 weeks   Medication Adjustments/Labs and Tests Ordered: Current medicines are reviewed at length with the patient today.  Concerns regarding medicines are outlined above.  Orders Placed This Encounter  Procedures   Basic Metabolic Panel (BMET)   Pro b natriuretic peptide (BNP)   Basic Metabolic Panel (BMET)   Pro b natriuretic peptide (BNP)   EKG 12-Lead   ECHOCARDIOGRAM COMPLETE   Meds ordered this encounter  Medications   furosemide  (LASIX ) 40 MG tablet    Sig: Take 1 tablet (40 mg total) by mouth daily.     Dispense:  90 tablet    Refill:  3   metoprolol  succinate (TOPROL  XL) 25 MG 24 hr tablet    Sig: Take 0.5 tablets (12.5 mg total) by mouth daily.    Dispense:  45 tablet    Refill:  3   spironolactone (ALDACTONE) 25 MG tablet    Sig: Take 1 tablet (25 mg total) by mouth daily.    Dispense:  90 tablet    Refill:  3     History of Present Illness:    Kathy Lawson is a 63 y.o. female with a hx of CAD with type II non-ST elevation MI in the setting of severe COPD profound respiratory distress and decompensated heart failure with IV fluid loading and fluid accumulation syndrome LV dysfunction EF 30 to 40% last seen 10/28/2021.  Subsequent ejection fraction January 2021 showed normalization EF.  Compliance with diet, lifestyle and medications: Yes  Several things 1 is for several months she is aware of her heart beating and feels rapid. An event monitor she thought that I had the report I came out I diligently searched and we have called the office in my office today she has resting sinus tachycardia When last seen by me she was on a beta-blocker she said she stopped taking it in the interim During this time she finds herself increasingly short of breath with any activity struggles even with ADLs and sleeps on 2 pillows because of her breathing She is taking a loop diuretic on a as needed basis  and she has peripheral edema She has had no chest pain cough wheezing palpitation or syncope He tells me she has had routine lab work done recently  This is being dictated after the visit 02/24/2024 I received her event monitor from her PCP.  Rhythm was sinus throughout she is having occasional supraventricular ectopy or symptomatic events were associated with sinus rhythm sinus tachycardia and often with APCs.  She had brief runs of atrial premature contractions and brief atrial tachycardia.  There were no episodes of atrial fibrillation or flutter  Past Medical History:  Diagnosis Date   C.  difficile colitis 08/2018   Coronary artery disease    Emphysema/COPD    Hyperlipemia    Hypertension    Vertigo     Current Medications: Current Meds  Medication Sig   albuterol  (PROVENTIL  HFA;VENTOLIN  HFA) 108 (90 BASE) MCG/ACT inhaler Inhale 2 puffs into the lungs every 4 (four) hours as needed for wheezing or shortness of breath.    amitriptyline (ELAVIL) 25 MG tablet Take 25 mg by mouth at bedtime.   aspirin  EC 81 MG tablet Take 81 mg by mouth daily.   atorvastatin  (LIPITOR) 40 MG tablet Take 1 tablet by mouth daily.   budesonide -formoterol  (SYMBICORT ) 160-4.5 MCG/ACT inhaler Inhale 2 puffs into the lungs 2 (two) times daily.   Cholecalciferol (VITAMIN D3) 1.25 MG (50000 UT) CAPS Take 1 capsule by mouth once a week.   conjugated estrogens (PREMARIN) vaginal cream Place 1 application vaginally 2 (two) times a week.   diclofenac Sodium (VOLTAREN) 1 % GEL Apply 1 application topically 4 (four) times daily as needed for pain.   diphenoxylate -atropine  (LOMOTIL ) 2.5-0.025 MG tablet Take 2 tablets by mouth 3 (three) times daily before meals. For Crohn's disease   DULoxetine (CYMBALTA) 60 MG capsule Take 60 mg by mouth at bedtime.   folic acid  (FOLVITE ) 1 MG tablet Take 1 mg by mouth daily.   furosemide  (LASIX ) 40 MG tablet Take 1 tablet (40 mg total) by mouth daily.   HYDROcodone -acetaminophen  (NORCO/VICODIN) 5-325 MG tablet Take 1 tablet by mouth every 8 (eight) hours as needed for moderate pain.   ipratropium-albuterol  (DUONEB) 0.5-2.5 (3) MG/3ML SOLN Take 3 mLs by nebulization every 6 (six) hours as needed (shortness of breath/wheezing).    levothyroxine (SYNTHROID) 50 MCG tablet Take 50 mcg by mouth daily.   metoprolol  succinate (TOPROL  XL) 25 MG 24 hr tablet Take 0.5 tablets (12.5 mg total) by mouth daily.   montelukast  (SINGULAIR ) 10 MG tablet Take 10 mg by mouth at bedtime.   omeprazole (PRILOSEC) 40 MG capsule Take 40 mg by mouth daily before breakfast.   ondansetron  (ZOFRAN ) 4 MG  tablet Take 4 mg by mouth every 8 (eight) hours as needed for nausea or vomiting.   ondansetron  (ZOFRAN -ODT) 4 MG disintegrating tablet Take 4 mg by mouth daily as needed.   spironolactone (ALDACTONE) 25 MG tablet Take 1 tablet (25 mg total) by mouth daily.   tiZANidine (ZANAFLEX) 4 MG tablet Take 4 mg by mouth 3 (three) times daily as needed for muscle spasms.   [DISCONTINUED] furosemide  (LASIX ) 40 MG tablet TAKE 1 TABLET(40 MG) BY MOUTH TWICE DAILY      EKGs/Labs/Other Studies Reviewed:    The following studies were reviewed today:  Cardiac Studies & Procedures   ______________________________________________________________________________________________   STRESS TESTS  MYOCARDIAL PERFUSION IMAGING 05/31/2019  Interpretation Summary  The left ventricular ejection fraction is hyperdynamic (>65%).  Nuclear stress EF: 80%.  There was no ST segment deviation  noted during stress.  This is a low risk study.  No schemia or scar noted,  Normal EF.   ECHOCARDIOGRAM  ECHOCARDIOGRAM COMPLETE 06/14/2019  Narrative ECHOCARDIOGRAM REPORT    Patient Name:   Kathy Lawson Date of Exam: 06/14/2019 Medical Rec #:  [id number]        Height:       62.0 in Accession #:    [id number]       Weight:       161.0 lb Date of Birth:  08-01-1960        BSA:          1.74 m Patient Age:    58 years         BP:           130/80 mmHg Patient Gender: F                HR:           78 bpm. Exam Location:  [place]  Procedure: 2D Echo  Indications:    R06 Dyspnea Upon Exertion  History:        Patient has no prior history of Echocardiogram examinations.  Sonographer:    Elinor Fresh RDCS Referring Phys: [phone] Erasmus Bistline J Giavonna Pflum  IMPRESSIONS   1. Left ventricular ejection fraction, by visual estimation, is 60 to 65%. The left ventricle has normal function. Left ventricular septal wall thickness was normal. Normal left ventricular posterior wall thickness. There is no left  ventricular hypertrophy. 2. Left ventricular diastolic parameters are consistent with Grade I diastolic dysfunction (impaired relaxation). 3. The left ventricle has no regional wall motion abnormalities. 4. Global right ventricle has normal systolic function.The right ventricular size is not well visualized. No increase in right ventricular wall thickness. 5. Left atrial size was normal. 6. Right atrial size was normal. 7. The mitral valve is normal in structure. No evidence of mitral valve regurgitation. No evidence of mitral stenosis. 8. The tricuspid valve is normal in structure. 9. The tricuspid valve is normal in structure. Tricuspid valve regurgitation is not demonstrated. 10. The aortic valve is tricuspid. Aortic valve regurgitation is not visualized. No evidence of aortic valve sclerosis or stenosis. 11. The pulmonic valve was normal in structure. Pulmonic valve regurgitation is not visualized. 12. The aortic arch was not well visualized. 13. The inferior vena cava is normal in size with greater than 50% respiratory variability, suggesting right atrial pressure of 3 mmHg.  FINDINGS Left Ventricle: Left ventricular ejection fraction, by visual estimation, is 60 to 65%. The left ventricle has normal function. The left ventricle has no regional wall motion abnormalities. The left ventricular internal cavity size was the left ventricle is normal in size. Normal left ventricular posterior wall thickness. There is no left ventricular hypertrophy. Left ventricular diastolic parameters are consistent with Grade I diastolic dysfunction (impaired relaxation). Normal left atrial pressure.  Right Ventricle: The right ventricular size is not well visualized. No increase in right ventricular wall thickness. Global RV systolic function is has normal systolic function.  Left Atrium: Left atrial size was normal in size.  Right Atrium: Right atrial size was normal in size  Pericardium: There is no  evidence of pericardial effusion.  Mitral Valve: The mitral valve is normal in structure. No evidence of mitral valve regurgitation. No evidence of mitral valve stenosis by observation.  Tricuspid Valve: The tricuspid valve is normal in structure. Tricuspid valve regurgitation is not demonstrated.  Aortic Valve: The aortic valve is tricuspid. Aortic  valve regurgitation is not visualized. The aortic valve is structurally normal, with no evidence of sclerosis or stenosis.  Pulmonic Valve: The pulmonic valve was normal in structure. Pulmonic valve regurgitation is not visualized. Pulmonic regurgitation is not visualized. No evidence of pulmonic stenosis.  Aorta: The aortic root and ascending aorta are structurally normal, with no evidence of dilitation and the aortic arch was not well visualized.  Venous: The pulmonary veins were not well visualized. The inferior vena cava is normal in size with greater than 50% respiratory variability, suggesting right atrial pressure of 3 mmHg.  IAS/Shunts: No atrial level shunt detected by color flow Doppler. There is no evidence of a patent foramen ovale. No ventricular septal defect is seen or detected. There is no evidence of an atrial septal defect.   LEFT VENTRICLE PLAX 2D LVIDd:         4.00 cm  Diastology LVIDs:         2.40 cm  LV e' lateral:   0.11 cm/s LV PW:         1.00 cm  LV E/e' lateral: 4.9 LV IVS:        0.80 cm  LV e' medial:    0.07 cm/s LVOT diam:     2.10 cm  LV E/e' medial:  8.0 LV SV:         50 ml LV SV Index:   27.50 LVOT Area:     3.46 cm   LEFT ATRIUM             Index       RIGHT ATRIUM           Index LA diam:        3.70 cm 2.12 cm/m  RA Area:     12.00 cm LA Vol (A2C):   15.7 ml 9.01 ml/m  RA Volume:   25.20 ml  14.46 ml/m LA Vol (A4C):   26.5 ml 15.20 ml/m LA Biplane Vol: 21.1 ml 12.10 ml/m AORTIC VALVE LVOT Vmax:   106.00 cm/s LVOT Vmean:  85.400 cm/s LVOT VTI:    0.225 m  AORTA Ao Root diam: 2.80  cm  MITRAL VALVE MV Area (PHT): 3.21 cm             SHUNTS MV PHT:        68.44 msec           Systemic VTI:  0.22 m MV Decel Time: 236 msec             Systemic Diam: 2.10 cm MV E velocity: 0.53 cm/s  103 cm/s MV A velocity: 72.30 cm/s 70.3 cm/s MV E/A ratio:  0.01       1.5   Redell Leiter MD Electronically signed by Redell Leiter MD Signature Date/Time: 06/15/2019/11:42:44 AM    Final          ______________________________________________________________________________________________      EKG Interpretation Date/Time:  Tuesday February 23 2024 16:22:14 EDT Ventricular Rate:  115 PR Interval:  152 QRS Duration:  66 QT Interval:  326 QTC Calculation: 450 R Axis:   59  Text Interpretation: Sinus tachycardia Low voltage QRS No previous ECGs available Confirmed by Leiter Redell ([id number]) on 02/23/2024 4:29:40 PM   Recent Labs: No results found for requested labs within last 365 days.  Recent Lipid Panel    Component Value Date/Time   CHOL 162 08/23/2019 1601   TRIG 140 08/23/2019 1601   HDL 43 08/23/2019 1601   CHOLHDL  3.8 08/23/2019 1601   LDLCALC 94 08/23/2019 1601    Physical Exam:    VS:  BP 112/80   Pulse (!) 115   Ht 5' 2 (1.575 m)   Wt 148 lb 3.2 oz (67.2 kg)   SpO2 95%   BMI 27.11 kg/m     Wt Readings from Last 3 Encounters:  02/23/24 148 lb 3.2 oz (67.2 kg)  10/28/21 157 lb 6.4 oz (71.4 kg)  04/29/21 171 lb 9.6 oz (77.8 kg)     GEN: She looks frail COPD appearance not short of breath at rest well nourished, well developed in no acute distress HEENT: Normal NECK: No JVD; No carotid bruits LYMPHATICS: No lymphadenopathy CARDIAC: Distant heart regular rhythm rapid RESPIRATORY: Hyperinflated severely diminished breath sounds ABDOMEN: Soft, non-tender, non-distended MUSCULOSKELETAL: 2-3+ bilateral lower extremity pitting edema; No deformity  SKIN: Warm and dry NEUROLOGIC:  Alert and oriented x 3 PSYCHIATRIC:  Normal affect     Signed, Redell Leiter, MD  02/24/2024 9:33 AM    [facility]

## 2024-02-23 ENCOUNTER — Ambulatory Visit: Attending: Cardiology | Admitting: Cardiology

## 2024-02-23 ENCOUNTER — Encounter: Payer: Self-pay | Admitting: Cardiology

## 2024-02-23 VITALS — BP 112/80 | HR 115 | Ht 62.0 in | Wt 148.2 lb

## 2024-02-23 DIAGNOSIS — I110 Hypertensive heart disease with heart failure: Secondary | ICD-10-CM | POA: Insufficient documentation

## 2024-02-23 DIAGNOSIS — I4719 Other supraventricular tachycardia: Secondary | ICD-10-CM | POA: Insufficient documentation

## 2024-02-23 DIAGNOSIS — I2510 Atherosclerotic heart disease of native coronary artery without angina pectoris: Secondary | ICD-10-CM | POA: Insufficient documentation

## 2024-02-23 DIAGNOSIS — I491 Atrial premature depolarization: Secondary | ICD-10-CM | POA: Diagnosis present

## 2024-02-23 DIAGNOSIS — E782 Mixed hyperlipidemia: Secondary | ICD-10-CM | POA: Insufficient documentation

## 2024-02-23 DIAGNOSIS — J439 Emphysema, unspecified: Secondary | ICD-10-CM | POA: Insufficient documentation

## 2024-02-23 MED ORDER — METOPROLOL SUCCINATE ER 25 MG PO TB24: 12.5000 mg | ORAL_TABLET | Freq: Every day | ORAL | 3 refills | Status: AC

## 2024-02-23 MED ORDER — SPIRONOLACTONE 25 MG PO TABS: 25.0000 mg | ORAL_TABLET | Freq: Every day | ORAL | 3 refills | Status: DC

## 2024-02-23 MED ORDER — FUROSEMIDE 40 MG PO TABS: 40.0000 mg | ORAL_TABLET | Freq: Every day | ORAL | 3 refills | Status: DC

## 2024-02-23 NOTE — Patient Instructions (Addendum)
 Medication Instructions:  Your physician has recommended you make the following change in your medication:   START: Furosemide  40 mg every day START: Toprol  XL 12.5 mg daily START: Spirinolactone 25 mg daily  *If you need a refill on your cardiac medications before your next appointment, please call your pharmacy*  Lab Work: Your physician recommends that you return for lab work in:   Labs today: BMP, Pro BNP Labs in 1 week: BMP, Pro BNP  If you have labs (blood work) drawn today and your tests are completely normal, you will receive your results only by: MyChart Message (if you have MyChart) OR A paper copy in the mail If you have any lab test that is abnormal or we need to change your treatment, we will call you to review the results.  Testing/Procedures: Your physician has requested that you have an echocardiogram. Echocardiography is a painless test that uses sound waves to create images of your heart. It provides your doctor with information about the size and shape of your heart and how well your heart's chambers and valves are working. This procedure takes approximately one hour. There are no restrictions for this procedure. Please do NOT wear cologne, perfume, aftershave, or lotions (deodorant is allowed). Please arrive 15 minutes prior to your appointment time.  Please note: We ask at that you not bring children with you during ultrasound (echo/ vascular) testing. Due to room size and safety concerns, children are not allowed in the ultrasound rooms during exams. Our front office staff cannot provide observation of children in our lobby area while testing is being conducted. An adult accompanying a patient to their appointment will only be allowed in the ultrasound room at the discretion of the ultrasound technician under special circumstances. We apologize for any inconvenience.   Follow-Up: At [facility], you and your health needs are our priority.  As part of our  continuing mission to provide you with exceptional heart care, our providers are all part of one team.  This team includes your primary Cardiologist (physician) and Advanced Practice Providers or APPs (Physician Assistants and Nurse Practitioners) who all work together to provide you with the care you need, when you need it.  Your next appointment:   6 week(s)  Provider:   Redell Leiter, MD    We recommend signing up for the patient portal called MyChart.  Sign up information is provided on this After Visit Summary.  MyChart is used to connect with patients for Virtual Visits (Telemedicine).  Patients are able to view lab/test results, encounter notes, upcoming appointments, etc.  Non-urgent messages can be sent to your provider as well.   To learn more about what you can do with MyChart, go to [email or web address].   Other Instructions Weigh daily and bring list of weights to the office in 1 week.

## 2024-03-04 ENCOUNTER — Ambulatory Visit: Payer: Self-pay

## 2024-03-04 LAB — BASIC METABOLIC PANEL WITH GFR
BUN/Creatinine Ratio: 12 (ref 12–28)
BUN: 13 mg/dL (ref 8–27)
CO2: 24 mmol/L (ref 20–29)
Calcium: 8.7 mg/dL (ref 8.7–10.3)
Chloride: 99 mmol/L (ref 96–106)
Creatinine, Ser: 1.1 mg/dL — ABNORMAL HIGH (ref 0.57–1.00)
Glucose: 105 mg/dL — ABNORMAL HIGH (ref 70–99)
Potassium: 3.6 mmol/L (ref 3.5–5.2)
Sodium: 142 mmol/L (ref 134–144)
eGFR: 56 mL/min/1.73 — ABNORMAL LOW (ref >59)

## 2024-03-04 LAB — PRO B NATRIURETIC PEPTIDE: NT-Pro BNP: 538 pg/mL — ABNORMAL HIGH (ref 0–287)

## 2024-03-07 ENCOUNTER — Other Ambulatory Visit: Payer: Self-pay

## 2024-03-14 ENCOUNTER — Other Ambulatory Visit: Payer: Self-pay

## 2024-03-14 MED ORDER — FUROSEMIDE 20 MG PO TABS: 20.0000 mg | ORAL_TABLET | Freq: Every day | ORAL | 3 refills | Status: AC

## 2024-03-14 MED ORDER — SPIRONOLACTONE 25 MG PO TABS: 12.5000 mg | ORAL_TABLET | Freq: Every day | ORAL | 3 refills | Status: AC

## 2024-03-14 NOTE — Progress Notes (Signed)
 Called the patient and informed her of Dr. Leandrew recommendation below:  Good results on her medications   Lets reduce the furosemide  by one half 20 mg daily and spironolactone one half 12-1/2 mg daily.  Patient verbalized understanding and had no further questions at this time. Lasix  and Spirinolactone medications ordered via Epic and sent to the patient's pharmacy.
